# Patient Record
Sex: Male | Born: 1970 | Race: White | Hispanic: No | Smoking: Current every day smoker
Health system: Southern US, Community
[De-identification: ages and names within clinical notes are randomized; demographics above are authoritative.]

## PROBLEM LIST (undated history)

## (undated) ENCOUNTER — Emergency Department (HOSPITAL_COMMUNITY): Admission: EM | Payer: Self-pay | Source: Home / Self Care

## (undated) DIAGNOSIS — R0600 Dyspnea, unspecified: Secondary | ICD-10-CM

## (undated) DIAGNOSIS — I10 Essential (primary) hypertension: Secondary | ICD-10-CM

## (undated) DIAGNOSIS — I739 Peripheral vascular disease, unspecified: Secondary | ICD-10-CM

## (undated) DIAGNOSIS — I639 Cerebral infarction, unspecified: Secondary | ICD-10-CM

## (undated) DIAGNOSIS — K219 Gastro-esophageal reflux disease without esophagitis: Secondary | ICD-10-CM

## (undated) DIAGNOSIS — F419 Anxiety disorder, unspecified: Secondary | ICD-10-CM

## (undated) HISTORY — PX: FEMORAL ARTERY STENT: SHX1583

---

## 2001-08-30 ENCOUNTER — Emergency Department (HOSPITAL_COMMUNITY): Admission: EM | Admit: 2001-08-30 | Discharge: 2001-08-30 | Payer: Self-pay | Admitting: *Deleted

## 2004-04-09 ENCOUNTER — Emergency Department (HOSPITAL_COMMUNITY): Admission: EM | Admit: 2004-04-09 | Discharge: 2004-04-09 | Payer: Self-pay | Admitting: Emergency Medicine

## 2012-02-24 ENCOUNTER — Encounter (HOSPITAL_COMMUNITY): Payer: Self-pay

## 2012-02-24 ENCOUNTER — Emergency Department (HOSPITAL_COMMUNITY)
Admission: EM | Admit: 2012-02-24 | Discharge: 2012-02-24 | Disposition: A | Discharge status: Home / Self Care | Payer: Self-pay | Attending: Emergency Medicine | Admitting: Emergency Medicine

## 2012-02-24 DIAGNOSIS — Y93E9 Activity, other interior property and clothing maintenance: Secondary | ICD-10-CM | POA: Insufficient documentation

## 2012-02-24 DIAGNOSIS — W260XXA Contact with knife, initial encounter: Secondary | ICD-10-CM | POA: Insufficient documentation

## 2012-02-24 DIAGNOSIS — S61209A Unspecified open wound of unspecified finger without damage to nail, initial encounter: Secondary | ICD-10-CM | POA: Insufficient documentation

## 2012-02-24 DIAGNOSIS — Y92009 Unspecified place in unspecified non-institutional (private) residence as the place of occurrence of the external cause: Secondary | ICD-10-CM | POA: Insufficient documentation

## 2012-02-24 DIAGNOSIS — W261XXA Contact with sword or dagger, initial encounter: Secondary | ICD-10-CM | POA: Insufficient documentation

## 2012-02-24 DIAGNOSIS — Z23 Encounter for immunization: Secondary | ICD-10-CM | POA: Insufficient documentation

## 2012-02-24 DIAGNOSIS — F17200 Nicotine dependence, unspecified, uncomplicated: Secondary | ICD-10-CM | POA: Insufficient documentation

## 2012-02-24 MED ORDER — TETANUS-DIPHTH-ACELL PERTUSSIS 5-2.5-18.5 LF-MCG/0.5 IM SUSP
INTRAMUSCULAR | Status: AC
  Administered 2012-02-24: 0.5 mL via INTRAMUSCULAR
  Filled 2012-02-24: qty 0.5

## 2012-02-24 MED ORDER — HYDROCODONE-ACETAMINOPHEN 5-325 MG PO TABS: ORAL_TABLET | ORAL | Status: DC

## 2012-02-24 MED ORDER — BUPIVACAINE HCL (PF) 0.25 % IJ SOLN
INTRAMUSCULAR | Status: AC
  Filled 2012-02-24: qty 30

## 2012-02-24 NOTE — ED Notes (Signed)
Pt was laying carpet, cut left hand middle finger with razor knife. Bleeding controlled w/ dressing.

## 2012-02-24 NOTE — ED Provider Notes (Signed)
History     CSN: [id number]  Arrival date & time 02/24/12  1756   First MD Initiated Contact with Patient 02/24/12 1823      Chief Complaint  Patient presents with  . Extremity Laceration    (Consider location/radiation/quality/duration/timing/severity/associated sxs/prior treatment) HPI Comments: Patient states he was assisted with remodeling improving today. He was cutting carpet with a new razor knife and cut the tip of the left middle finger. He attempted to get the bleeding to stop on his own but was unsuccessful, and presents to the emergency department for assistance with this problem. Patient states he is unsure of the date of his last tetanus. The patient is not on any blood thinning type medications. He does not have a history of any bleeding disorders. There were no other lacerations or injuries reported at this time.  The history is provided by the patient.    History reviewed. No pertinent past medical history.  History reviewed. No pertinent past surgical history.  No family history on file.  History  Substance Use Topics  . Smoking status: Current Every Day Smoker    Types: Cigarettes  . Smokeless tobacco: Not on file  . Alcohol Use: No      Review of Systems  Constitutional: Negative for activity change.       All ROS Neg except as noted in HPI  HENT: Negative for nosebleeds and neck pain.   Eyes: Negative for photophobia and discharge.  Respiratory: Negative for cough, shortness of breath and wheezing.   Cardiovascular: Negative for chest pain and palpitations.  Gastrointestinal: Negative for abdominal pain and blood in stool.  Genitourinary: Negative for dysuria, frequency and hematuria.  Musculoskeletal: Negative for back pain and arthralgias.  Skin: Negative.   Neurological: Negative for dizziness, seizures and speech difficulty.  Psychiatric/Behavioral: Negative for hallucinations and confusion.    Allergies  Review of patient's allergies  indicates not on file.  Home Medications   Current Outpatient Rx  Name  Route  Sig  Dispense  Refill  . HYDROcodone-acetaminophen (NORCO/VICODIN) 5-325 MG per tablet      1 or 2 po q4h prn pain   20 tablet   0     BP 140/79  Pulse 86  Temp(Src) 98.2 F (36.8 C) (Oral)  Resp 20  Ht 5\' 10"  (1.778 m)  Wt 160 lb (72.576 kg)  BMI 22.96 kg/m2  SpO2 100%  Physical Exam  Nursing note and vitals reviewed. Constitutional: He is oriented to person, place, and time. He appears well-developed and well-nourished.  Non-toxic appearance.  HENT:  Head: Normocephalic.  Right Ear: Tympanic membrane and external ear normal.  Left Ear: Tympanic membrane and external ear normal.  Eyes: EOM and lids are normal. Pupils are equal, round, and reactive to light.  Neck: Normal range of motion. Neck supple. Carotid bruit is not present.  Cardiovascular: Normal rate, regular rhythm, normal heart sounds, intact distal pulses and normal pulses.   Pulmonary/Chest: Breath sounds normal. No respiratory distress.  Abdominal: Soft. Bowel sounds are normal. There is no tenderness. There is no guarding.  Musculoskeletal: Normal range of motion.  Patient has a dime-sized avulsion of the plantar surface of the tip of the left middle finger. There is active bleeding present. There is no bone or tendon involvement. The patient has full range of motion of all the fingers of the left hand.  Lymphadenopathy:       Head (right side): No submandibular adenopathy present.  Head (left side): No submandibular adenopathy present.    He has no cervical adenopathy.  Neurological: He is alert and oriented to person, place, and time. He has normal strength. No cranial nerve deficit or sensory deficit.  Skin: Skin is warm and dry.  Psychiatric: He has a normal mood and affect. His speech is normal.    ED Course  Procedures REPAIR OF AVULSION OF TIP OF THE LEFT MIDDLE FINGER. Patient identified by arm band. Permission  for the procedure given by the patient. Procedural time out taken before repair of avulsion of the tip of the left middle finger.  The patient was cutting carpet when he cut the tip of his left middle finger. There is no flap. There is bleeding from the site. The procedure was explained to the patient in terms which he understood, and questions answered.  The left hand was cleansed with sure-cleanse. A digital block to the left middle finger was carried out with 0.25% Marcaine. A sterile rubber band was used as a tourniquet. Bleeding was controlled and the wound was better inspected. The wound was irrigated with saline and cleansed with sure-cleanse. The middle finger was then painted with Betadine and using sterile technique direct pressure was applied to the avulsed area. 3 areas of bleeding were cauterized. The area was then observed and no bleeding occurred. The tourniquet was removed and still no bleeding present. A sterile dressing using quick clot was applied by me. Patient tolerated the procedure without problem. Patient's tetanus was updated.  Labs Reviewed - No data to display No results found.   1. Avulsion, finger tip       MDM  I have reviewed nursing notes, vital signs, and all appropriate lab and imaging results for this patient.  Patient sustained an avulsion laceration of the tip of the left middle finger. The bleeding area was cauterized, and a sterile pressure dressing applied. Patient status was updated. The patient is given a prescription for Norco one to 2 tablets every 4 hours as needed for pain #20 tablets. The patient has been given instructions on cleansing the wound as well as instructions on return if any signs of infection. Patient verbalizes understanding of the instructions.       Kathie Dike, [place] 02/24/12 [phone]

## 2012-02-24 NOTE — Discharge Instructions (Signed)
You have an avulsion wound at the tip of the left middle finger. Please keep your left hand elevated above your heart is much as possible today for her. Please cleanse your hand with soap and water starting Thursday, January 20, and change dressing daily. On February 24 you may use a Band-Aid to the area only. Please Loraine Leriche your records that you received a tetanus shot today. Please use ibuprofen every 6 hours for mild pain, please use Norco for more severe pain. Norco may cause drowsiness or lightheadedness, please use with caution. Please return to the emergency department if any changes, problems, or concerns. Deep Skin Avulsion A deep skin avulsion is when all layers of the skin or parts of body structures have been torn away. This is usually a result of severe injury (trauma). A deep skin avulsion can include damage to important structures beneath the skin such as tendons, ligaments, nerves, or blood vessels.  CAUSES  Many injuries can lead to a deep skin avulsion. These include:   Crush injuries.  Bites.  Falls against jagged surfaces.  Gunshot wounds.  Severe burns and injuries involving dragging (such as those from a bicycle or motorcycle accident). TREATMENT   If the wound is small and there is no damage to vital structures like nerves and blood vessels, the damaged tissues may be removed. Then, the wound can be cleaned thoroughly and closed.  A skin graft may be performed. This is a procedure in which the outer layer of skin is removed from a different part of your body. That skin (skin graft) is used to cover the open wound. This can happen after damaged tissue is removed and repairs are completed.  Your caregiver may onlyapply a bandage (dressing) to the wound. The wound will be kept clean and allowed to heal. Healing can take weeks or months and usually leaves a large scar. This type of treatment is only done if your caregiver feels that skin grafting or a similar procedure would not  work. You might need a tetanus shot if:  You cannot remember when you had your last tetanus shot.  You have never had a tetanus shot.  The injury broke your skin. If you got a tetanus shot, your arm may swell, get red, and feel warm to the touch. This is common and not a problem. If you need a tetanus shot and you choose not to have one, there is a rare chance of getting tetanus. Sickness from tetanus can be serious. HOME CARE INSTRUCTIONS   Only take over-the-counter or prescription medicines for pain, discomfort, or fever as directed by your caregiver.  Gently wash the area with mild soap and water 2 times a day, or as directed. Rinse off the soap. Pat the area dry with a clean towel. Do not rub the wound. This may cause bleeding.  Follow your caregiver's instructions for how often you need to change the dressing.  Apply ointment and a dressing to the wound as directed.  If the dressing sticks, moisten it with soapy water and gently remove it.  Change the bandage right away if it becomes wet, dirty, or starts to smell bad.  Take showers. Do not take tub baths, swim, or do anything that may soak the wound until it is healed.  Use anti-itch medicine as directed by your caregiver. The wound may itch when it is healing. Do not pick or scratch at the wound.  Follow up with your caregiver for stitches (sutures), staple, or skin  adhesive strip removal. SEEK MEDICAL CARE IF:   You have redness, swelling, or increasing pain in your wound.  A red streak or line extends away from the wound.  You have pus coming from the wound.  You notice a bad smell coming from thewound or dressing.  The wound breaks open (edges not staying together) after sutures have been removed.  You notice something coming out of the wound, such as a small piece of wood, glass, or metal.  You are unable to properly move a finger or toe if the wound is on your hand or foot.  You have severe swelling around  the wound that causes pain and numbness.  Your arm, hand, leg, or foot changes color. SEEK IMMEDIATE MEDICAL CARE IF:   Your pain becomes severe or is not adequately relieved with pain medicine.  You have a fever.  You have nausea and vomiting for more than 24 hours.  You feel lightheaded, weak, or faint.  You develop chest pain or difficulty breathing. MAKE SURE YOU:   Understand these instructions.  Will watch your condition.  Will get help right away if you are not doing well or get worse. Document Released: 02/18/2006 Document Revised: 03/17/2011 Document Reviewed: 04/28/2010 [facility] Patient Information 2013 [place], [place].

## 2012-02-24 NOTE — ED Notes (Signed)
Loney Laurence PA in to see pt.  Treated and bandaged by him.

## 2012-02-29 NOTE — ED Provider Notes (Signed)
History/physical exam/procedure(s) were performed by non-physician practitioner and as supervising physician I was immediately available for consultation/collaboration. I have reviewed all notes and am in agreement with care and plan.   Hilario Quarry, MD 02/29/12 [phone]

## 2013-02-17 ENCOUNTER — Ambulatory Visit (INDEPENDENT_AMBULATORY_CARE_PROVIDER_SITE_OTHER): Payer: Self-pay | Admitting: Otolaryngology

## 2014-03-16 ENCOUNTER — Encounter (HOSPITAL_COMMUNITY): Payer: Self-pay | Admitting: Emergency Medicine

## 2014-03-16 ENCOUNTER — Emergency Department (HOSPITAL_COMMUNITY)
Admission: EM | Admit: 2014-03-16 | Discharge: 2014-03-16 | Disposition: A | Discharge status: Home / Self Care | Payer: Self-pay | Attending: Emergency Medicine | Admitting: Emergency Medicine

## 2014-03-16 DIAGNOSIS — K047 Periapical abscess without sinus: Secondary | ICD-10-CM | POA: Insufficient documentation

## 2014-03-16 DIAGNOSIS — Z880 Allergy status to penicillin: Secondary | ICD-10-CM | POA: Insufficient documentation

## 2014-03-16 DIAGNOSIS — Z720 Tobacco use: Secondary | ICD-10-CM | POA: Insufficient documentation

## 2014-03-16 MED ORDER — HYDROCODONE-ACETAMINOPHEN 5-325 MG PO TABS: 2.0000 | ORAL_TABLET | Freq: Once | ORAL | Status: DC

## 2014-03-16 MED ORDER — KETOROLAC TROMETHAMINE 30 MG/ML IJ SOLN
30.0000 mg | Freq: Once | INTRAMUSCULAR | Status: AC
  Administered 2014-03-16: 30 mg via INTRAVENOUS
  Filled 2014-03-16: qty 1

## 2014-03-16 MED ORDER — CLINDAMYCIN PHOSPHATE 600 MG/50ML IV SOLN
600.0000 mg | Freq: Once | INTRAVENOUS | Status: AC
  Administered 2014-03-16: 600 mg via INTRAVENOUS
  Filled 2014-03-16: qty 50

## 2014-03-16 NOTE — Discharge Instructions (Signed)
You were treated with IV clindamycin and IV Toradol tonight. Please see your dentist for follow-up as directed. Pleased usual medications as directed. Return to the emergency department or see your dentist if any fever that will not respond to Tylenol or ibuprofen, or difficulty with swallowing or breathing. Dental Abscess A dental abscess is a collection of infected fluid (pus) from a bacterial infection in the inner part of the tooth (pulp). It usually occurs at the end of the tooth's root.  CAUSES   Severe tooth decay.  Trauma to the tooth that allows bacteria to enter into the pulp, such as a broken or chipped tooth. SYMPTOMS   Severe pain in and around the infected tooth.  Swelling and redness around the abscessed tooth or in the mouth or face.  Tenderness.  Pus drainage.  Bad breath.  Bitter taste in the mouth.  Difficulty swallowing.  Difficulty opening the mouth.  Nausea.  Vomiting.  Chills.  Swollen neck glands. DIAGNOSIS   A medical and dental history will be taken.  An examination will be performed by tapping on the abscessed tooth.  X-rays may be taken of the tooth to identify the abscess. TREATMENT The goal of treatment is to eliminate the infection. You may be prescribed antibiotic medicine to stop the infection from spreading. A root canal may be performed to save the tooth. If the tooth cannot be saved, it may be pulled (extracted) and the abscess may be drained.  HOME CARE INSTRUCTIONS  Only take over-the-counter or prescription medicines for pain, fever, or discomfort as directed by your caregiver.  Rinse your mouth (gargle) often with salt water ( tsp salt in 8 oz [250 ml] of warm water) to relieve pain or swelling.  Do not drive after taking pain medicine (narcotics).  Do not apply heat to the outside of your face.  Return to your dentist for further treatment as directed. SEEK MEDICAL CARE IF:  Your pain is not helped by medicine.  Your  pain is getting worse instead of better. SEEK IMMEDIATE MEDICAL CARE IF:  You have a fever or persistent symptoms for more than 2-3 days.  You have a fever and your symptoms suddenly get worse.  You have chills or a very bad headache.  You have problems breathing or swallowing.  You have trouble opening your mouth.  You have swelling in the neck or around the eye. Document Released: 12/23/2004 Document Revised: 09/17/2011 Document Reviewed: 04/02/2010 [facility] Patient Information 2015 [place], [place]. This information is not intended to replace advice given to you by your health care provider. Make sure you discuss any questions you have with your health care provider.

## 2014-03-16 NOTE — ED Provider Notes (Signed)
CSN: [id number]     Arrival date & time 03/16/14  1353 History   First MD Initiated Contact with Patient 03/16/14 1630     Chief Complaint  Patient presents with  . Abscess     (Consider location/radiation/quality/duration/timing/severity/associated sxs/prior Treatment) HPI Comments: Patient is a 44 year old male who presents to the emergency department with an abscess of the left upper jaw. The patient was seen by the dentist today, and sent to the emergency department for IV antibiotics because of an abscess. The patient denies any recent fever. There's been no problem with breathing or swallowing. He does not have any medical conditions that would interfere with the immune system. He presents now at the request of his dentist for additional management.  Patient is a 44 y.o. male presenting with abscess. The history is provided by the patient.  Abscess Abscess location: LEFT UPPER JAW. Associated symptoms: no fever     History reviewed. No pertinent past medical history. History reviewed. No pertinent past surgical history. No family history on file. History  Substance Use Topics  . Smoking status: Current Every Day Smoker    Types: Cigarettes  . Smokeless tobacco: Not on file  . Alcohol Use: No    Review of Systems  Constitutional: Negative for fever, chills, activity change and appetite change.       All ROS Neg except as noted in HPI  HENT: Positive for dental problem.   Eyes: Negative for photophobia and discharge.  Respiratory: Negative for cough, shortness of breath and wheezing.   Cardiovascular: Negative for chest pain and palpitations.  Gastrointestinal: Negative for abdominal pain and blood in stool.  Genitourinary: Negative for dysuria, frequency and hematuria.  Musculoskeletal: Negative for back pain, arthralgias and neck pain.  Skin: Negative.   Neurological: Negative for dizziness, seizures and speech difficulty.  Psychiatric/Behavioral: Negative for  hallucinations and confusion.      Allergies  Penicillins  Home Medications   Prior to Admission medications   Not on File   BP 180/84 mmHg  Pulse 73  Temp(Src) 98.7 F (37.1 C) (Oral)  Resp 20  Ht 5\' 10"  (1.778 m)  Wt 162 lb (73.483 kg)  BMI 23.24 kg/m2  SpO2 100% Physical Exam  Constitutional: He is oriented to person, place, and time. He appears well-developed and well-nourished.  Non-toxic appearance.  HENT:  Head: Normocephalic.  Right Ear: Tympanic membrane and external ear normal.  Left Ear: Tympanic membrane and external ear normal.  Abscess noted at the left upper bicuspid (tooth #12). There is swelling of the gum.  There is mild soreness of the left face. No hot areas appreciated.  Eyes: EOM and lids are normal. Pupils are equal, round, and reactive to light.  Neck: Normal range of motion. Neck supple. Carotid bruit is not present.  Cardiovascular: Normal rate, regular rhythm, normal heart sounds, intact distal pulses and normal pulses.   Pulmonary/Chest: Breath sounds normal. No respiratory distress.  Abdominal: Soft. Bowel sounds are normal. There is no tenderness. There is no guarding.  Musculoskeletal: Normal range of motion.  Lymphadenopathy:       Head (right side): No submandibular adenopathy present.       Head (left side): No submandibular adenopathy present.    He has no cervical adenopathy.  Neurological: He is alert and oriented to person, place, and time. He has normal strength. No cranial nerve deficit or sensory deficit.  Skin: Skin is warm and dry.  Psychiatric: He has a normal mood and affect.  His speech is normal.  Nursing note and vitals reviewed.   ED Course  Procedures (including critical care time) Labs Review Labs Reviewed - No data to display  Imaging Review No results found.   EKG Interpretation None      MDM  The blood pressure is elevated at 180/84. The vital signs are otherwise within normal limits. The pulse oximetry  is 100% on room air. Within normal limits by my interpretation. No evidence for Ludwig's angina.  I spoke with Dr. Marland KitchenD" [facility]. She request that the patient received IV antibiotics. She has given the patient prescription for oral antibiotics for tomorrow.  Patient was given 600 mg of clindamycin IV, as well as 30 mg of Toradol IV. The patient tolerated this without any problem or reaction whatsoever.  The patient will be discharged home he has ibuprofen prescription as well as clindamycin prescription. The patient is invited to return if any fevers that will not respond to Tylenol or ibuprofen, or any significant change in his general condition.    Final diagnoses:  None    **I have reviewed nursing notes, vital signs, and all appropriate lab and imaging results for this patient.Lily Kocher, PA-C 03/16/14 [place], PA-C 03/16/14 1755  Virgel Manifold, MD 03/20/14 [phone]

## 2014-03-16 NOTE — ED Notes (Signed)
Pt reports went to dentist for dental abscess, facial swelling noted.  Reports went to dentist today and sent here for possible IV antibiotics.

## 2016-09-02 ENCOUNTER — Ambulatory Visit: Payer: Self-pay | Admitting: Family Medicine

## 2016-09-17 ENCOUNTER — Ambulatory Visit: Payer: Self-pay | Admitting: Family Medicine

## 2016-10-28 ENCOUNTER — Ambulatory Visit: Payer: Self-pay | Admitting: Family Medicine

## 2018-02-03 DIAGNOSIS — Z139 Encounter for screening, unspecified: Secondary | ICD-10-CM

## 2018-02-03 LAB — GLUCOSE, POCT (MANUAL RESULT ENTRY): POC Glucose: 124 mg/dl — AB (ref 70–99)

## 2018-02-03 NOTE — Congregational Nurse Program (Signed)
  Dept: [phone]   Congregational Nurse Program Note  Date of Encounter: 02/03/2018  Past Medical History: No past medical history on file.  Encounter Details: CNP Questionnaire - 02/03/18 1441      Questionnaire   Patient Status  Not Applicable    Race  White or Caucasian    Location Patient Served At  [facility]  Not Applicable    Uninsured  Uninsured (NEW 1x/quarter)    Food  No food insecurities    Housing/Utilities  Yes, have permanent housing    Transportation  No transportation needs    Interpersonal Safety  Yes, feel physically and emotionally safe where you currently live    Medication  No medication insecurities    Medical Provider  No    Referrals  [facility];Dental    ED Visit Averted  Not Applicable    Life-Saving Intervention Made  Not Applicable      Pt Presents to clinic with need of medical referral for PCP due concern c/o elevated BP (self-monitored) and of dental pain and care that he has been dealing with in the last 4 years.  States he last saw a PCP about 25 years ago and last saw a dentist 2-3 months ago.   States the dentist prescribed him with Clindamycin for the infection he had within his tooth.     Vital signs checked and Random Blood Glucose conducted  Pt interviewed and enrolled  for [facility] eligibility with C. Broadnax  Vitals signs reviewed with RN Case Manager, P.Gilley,, in addition to pt also referred for post visit case management to be followed up.  Appt was made with [facility] for Feb 08, 2018 at 9:45 AM

## 2018-02-08 ENCOUNTER — Encounter: Payer: Self-pay | Admitting: Physician Assistant

## 2018-02-08 ENCOUNTER — Ambulatory Visit: Payer: Self-pay | Admitting: Physician Assistant

## 2018-02-08 ENCOUNTER — Other Ambulatory Visit: Payer: Self-pay | Admitting: Physician Assistant

## 2018-02-08 ENCOUNTER — Other Ambulatory Visit (HOSPITAL_COMMUNITY)
Admission: RE | Admit: 2018-02-08 | Discharge: 2018-02-08 | Disposition: A | Discharge status: Home / Self Care | Payer: Self-pay | Source: Ambulatory Visit | Attending: Physician Assistant | Admitting: Physician Assistant

## 2018-02-08 VITALS — BP 125/70 | HR 65 | Temp 97.7°F | Ht 68.5 in | Wt 175.0 lb

## 2018-02-08 DIAGNOSIS — F419 Anxiety disorder, unspecified: Secondary | ICD-10-CM

## 2018-02-08 DIAGNOSIS — Z1211 Encounter for screening for malignant neoplasm of colon: Secondary | ICD-10-CM

## 2018-02-08 DIAGNOSIS — Z13220 Encounter for screening for lipoid disorders: Secondary | ICD-10-CM

## 2018-02-08 DIAGNOSIS — Z125 Encounter for screening for malignant neoplasm of prostate: Secondary | ICD-10-CM

## 2018-02-08 DIAGNOSIS — Z7689 Persons encountering health services in other specified circumstances: Secondary | ICD-10-CM

## 2018-02-08 DIAGNOSIS — R5383 Other fatigue: Secondary | ICD-10-CM

## 2018-02-08 DIAGNOSIS — K0889 Other specified disorders of teeth and supporting structures: Secondary | ICD-10-CM

## 2018-02-08 LAB — LIPID PANEL
Cholesterol: 238 mg/dL — ABNORMAL HIGH (ref 0–200)
HDL: 36 mg/dL — ABNORMAL LOW (ref >40)
LDL Cholesterol: 151 mg/dL — ABNORMAL HIGH (ref 0–99)
Total CHOL/HDL Ratio: 6.6 RATIO
Triglycerides: 253 mg/dL — ABNORMAL HIGH (ref <150)
VLDL: 51 mg/dL — ABNORMAL HIGH (ref 0–40)

## 2018-02-08 LAB — CBC
HCT: 51.1 % (ref 39.0–52.0)
Hemoglobin: 16.5 g/dL (ref 13.0–17.0)
MCH: 29.8 pg (ref 26.0–34.0)
MCHC: 32.3 g/dL (ref 30.0–36.0)
MCV: 92.2 fL (ref 80.0–100.0)
Platelets: 278 10*3/uL (ref 150–400)
RBC: 5.54 MIL/uL (ref 4.22–5.81)
RDW: 12.1 % (ref 11.5–15.5)
WBC: 9.5 10*3/uL (ref 4.0–10.5)
nRBC: 0 % (ref 0.0–0.2)

## 2018-02-08 LAB — COMPREHENSIVE METABOLIC PANEL
ALT: 16 U/L (ref 0–44)
AST: 16 U/L (ref 15–41)
Albumin: 4.6 g/dL (ref 3.5–5.0)
Alkaline Phosphatase: 59 U/L (ref 38–126)
Anion gap: 8 (ref 5–15)
BUN: 13 mg/dL (ref 6–20)
CO2: 28 mmol/L (ref 22–32)
Calcium: 8.9 mg/dL (ref 8.9–10.3)
Chloride: 104 mmol/L (ref 98–111)
Creatinine, Ser: 0.9 mg/dL (ref 0.61–1.24)
GFR calc Af Amer: >60 mL/min (ref >60)
GFR calc non Af Amer: >60 mL/min (ref >60)
Glucose, Bld: 92 mg/dL (ref 70–99)
Potassium: 4.4 mmol/L (ref 3.5–5.1)
Sodium: 140 mmol/L (ref 135–145)
Total Bilirubin: 0.6 mg/dL (ref 0.3–1.2)
Total Protein: 7.5 g/dL (ref 6.5–8.1)

## 2018-02-08 LAB — TSH: TSH: 1.538 u[IU]/mL (ref 0.350–4.500)

## 2018-02-08 LAB — PSA: Prostatic Specific Antigen: 0.75 ng/mL (ref 0.00–4.00)

## 2018-02-08 MED ORDER — CLINDAMYCIN HCL 300 MG PO CAPS: 300.0000 mg | ORAL_CAPSULE | Freq: Three times a day (TID) | ORAL | 0 refills | Status: DC

## 2018-02-08 NOTE — Progress Notes (Signed)
BP 125/70 (BP Location: Left Arm, Patient Position: Sitting, Cuff Size: Normal)   Pulse 65   Temp 97.7 F (36.5 C)   Ht 5' 8.5" (1.74 m)   Wt 175 lb (79.4 kg)   SpO2 98%   BMI 26.22 kg/m    Subjective:    Patient ID: Caleb Powell, male    DOB: 31-Oct-1970, 48 y.o.   MRN: [id number]  HPI: Caleb Powell is a 48 y.o. male presenting on 02/08/2018 for New Patient (Initial Visit) (no PCP in a very long time. pt states he has seen Dr. Merlene Laughter to get clindamycin for teeth. pt states he last saw him 11/2017) and Dental Problem (numbness on upper L which radiates upward. )   HPI Chief Complaint  Patient presents with  . New Patient (Initial Visit)    no PCP in a very long time. pt states he has seen Dr. Merlene Laughter to get clindamycin for teeth. pt states he last saw him 11/2017  . Dental Problem    numbness on upper L which radiates upward.      Pt saw [facility] Dr Merlene Laughter.  Pt does "odds and ends jobs" currently.   In the past pt drove a forklift.   Pt has some anxiety-- uses his friends ativan    Relevant past medical, surgical, family and social history reviewed and updated as indicated. Interim medical history since our last visit reviewed. Allergies and medications reviewed and updated.   Current Outpatient Medications:  .  clindamycin (CLEOCIN) 300 MG capsule, Take 300 mg by mouth 3 (three) times daily., Disp: , Rfl:  .  LORazepam (ATIVAN PO), Take by mouth., Disp: , Rfl:  .  Pseudoeph-Doxylamine-DM-APAP (NYQUIL PO), Take by mouth., Disp: , Rfl:     Review of Systems  Constitutional: Positive for fatigue. Negative for appetite change, chills, diaphoresis, fever and unexpected weight change.  HENT: Positive for congestion, dental problem, facial swelling and sore throat. Negative for drooling, ear pain, hearing loss, mouth sores, sneezing, trouble swallowing and voice change.   Eyes: Negative for pain, discharge, redness, itching and visual disturbance.  Respiratory:  Positive for cough, shortness of breath and wheezing. Negative for choking.   Cardiovascular: Negative for chest pain, palpitations and leg swelling.  Gastrointestinal: Negative for abdominal pain, blood in stool, constipation, diarrhea and vomiting.  Endocrine: Negative for cold intolerance, heat intolerance and polydipsia.  Genitourinary: Negative for decreased urine volume, dysuria and hematuria.  Musculoskeletal: Negative for arthralgias, back pain and gait problem.  Skin: Negative for rash.  Allergic/Immunologic: Negative for environmental allergies.  Neurological: Positive for headaches. Negative for seizures, syncope and light-headedness.  Hematological: Negative for adenopathy.  Psychiatric/Behavioral: Negative for agitation, dysphoric mood and suicidal ideas. The patient is nervous/anxious.     Per HPI unless specifically indicated above     Objective:    BP 125/70 (BP Location: Left Arm, Patient Position: Sitting, Cuff Size: Normal)   Pulse 65   Temp 97.7 F (36.5 C)   Ht 5' 8.5" (1.74 m)   Wt 175 lb (79.4 kg)   SpO2 98%   BMI 26.22 kg/m   Wt Readings from Last 3 Encounters:  02/08/18 175 lb (79.4 kg)  02/03/18 174 lb 3.2 oz (79 kg)  03/16/14 162 lb (73.5 kg)    Physical Exam Vitals signs reviewed.  Constitutional:      Appearance: He is well-developed.  HENT:     Head: Normocephalic and atraumatic.     Mouth/Throat:  Dentition: Abnormal dentition. Dental caries present. No dental abscesses.     Pharynx: No oropharyngeal exudate.     Comments: No swelling of face Eyes:     Conjunctiva/sclera: Conjunctivae normal.     Pupils: Pupils are equal, round, and reactive to light.  Neck:     Musculoskeletal: Neck supple.     Thyroid: No thyromegaly.  Cardiovascular:     Rate and Rhythm: Normal rate and regular rhythm.  Pulmonary:     Effort: Pulmonary effort is normal.     Breath sounds: Normal breath sounds. No wheezing or rales.  Abdominal:     General:  Bowel sounds are normal.     Palpations: Abdomen is soft. There is no mass.     Tenderness: There is no abdominal tenderness.  Lymphadenopathy:     Cervical: No cervical adenopathy.  Skin:    General: Skin is warm and dry.     Findings: No rash.  Neurological:     Mental Status: He is alert and oriented to person, place, and time.  Psychiatric:        Behavior: Behavior normal.        Thought Content: Thought content normal.          Assessment & Plan:    Encounter Diagnoses  Name Primary?  . Encounter to establish care Yes  . Dentalgia   . Other fatigue   . Anxiety   . Screening cholesterol level   . Screening for colon cancer   . Screening for prostate cancer     -will get baseline Labs -encouraged pt to go to [facility] for evaluation and treatment of anxiety -counseled regular exercise to help anxiety and fatigue -pt given rx clindamycin and will be put on dential list.  Pt requested refills on his antibiotic but discussed that wasn't appropriate -pt was given ifobt for colon cancer screening -counseled Smoking cessation -pt to follow up 1 month

## 2018-02-09 ENCOUNTER — Telehealth: Payer: Self-pay

## 2018-02-09 NOTE — Telephone Encounter (Signed)
Called to follow up with client after his first visit at [facility] [place]. No answer. Left voicemail requesting return call.  Debria Garret RN

## 2018-02-09 NOTE — Telephone Encounter (Signed)
Client returned call. He had questions regarding his dental needs. 1). Why was antibiotic not free: explained to client how [facility] works and that its use is primarily for long term medications and only those that are on the Med Assist list of approved medications.  Client states understanding.  2). How does the dental referral work. Will I have to wait over 30 days. Explained that Dental is a donated care and that there is a list of people waiting to be seen. The provider office will send a dental referral to our referral coordinator with [facility] and that referral is sent to a dentist as soon as there are openings. Client states understanding.  3). Why would the provider not give me refills on my antibiotic. "she gave me only enough to last 7 days" Explained to client the importance to take a course of antibiotics as directed until finished. Taking only 1 pill a day to stretch out the antibiotics. A resistance to antibiotics can occur taking them for long periods of time and also you will never fully get rid of the infection. RN recommended client taking his medication as directed and if after the 7 days he is still having problems he would then contact his primary care provider and discuss being seen if needed prior to his follow up. RN also discussed good oral hygiene. Client states understanding.  Client did report that he did get his labs drawn as directed. Encouraged client to call Case Manager for any questions or concerns. Contact information given.  Debria Garret RN

## 2018-02-24 ENCOUNTER — Ambulatory Visit: Payer: Self-pay | Admitting: Physician Assistant

## 2018-02-24 ENCOUNTER — Encounter: Payer: Self-pay | Admitting: Physician Assistant

## 2018-02-24 VITALS — BP 130/70 | HR 83 | Temp 98.1°F | Ht 68.5 in | Wt 175.2 lb

## 2018-02-24 DIAGNOSIS — F17200 Nicotine dependence, unspecified, uncomplicated: Secondary | ICD-10-CM

## 2018-02-24 DIAGNOSIS — F39 Unspecified mood [affective] disorder: Secondary | ICD-10-CM | POA: Insufficient documentation

## 2018-02-24 DIAGNOSIS — E785 Hyperlipidemia, unspecified: Secondary | ICD-10-CM

## 2018-02-24 DIAGNOSIS — Z720 Tobacco use: Secondary | ICD-10-CM | POA: Insufficient documentation

## 2018-02-24 DIAGNOSIS — K0889 Other specified disorders of teeth and supporting structures: Secondary | ICD-10-CM

## 2018-02-24 MED ORDER — CLINDAMYCIN HCL 300 MG PO CAPS: 300.0000 mg | ORAL_CAPSULE | Freq: Three times a day (TID) | ORAL | 0 refills | Status: DC

## 2018-02-24 MED ORDER — CHOLESTYRAMINE 4 GM/DOSE PO POWD: ORAL | 1 refills | Status: DC

## 2018-02-24 MED ORDER — ATORVASTATIN CALCIUM 20 MG PO TABS: 20.0000 mg | ORAL_TABLET | Freq: Every day | ORAL | 1 refills | Status: DC

## 2018-02-24 NOTE — Patient Instructions (Signed)

## 2018-02-24 NOTE — Progress Notes (Signed)
BP 130/70 (BP Location: Left Arm, Patient Position: Sitting, Cuff Size: Normal)   Pulse 83   Temp 98.1 F (36.7 C)   Ht 5' 8.5" (1.74 m)   Wt 175 lb 4 oz (79.5 kg)   SpO2 96%   BMI 26.26 kg/m    Subjective:    Patient ID: Caleb Powell, male    DOB: Jan 09, 1970, 48 y.o.   MRN: [id number]  HPI: Caleb Powell is a 48 y.o. male presenting on 02/24/2018 for Follow-up and Dental Problem (pt states swelling on L upper teeth. pt has been taking IBU which has helped with the swelling.  pt states no drainage. )   HPI   Chief Complaint  Patient presents with  . Follow-up  . Dental Problem    pt states swelling on L upper teeth. pt has been taking IBU which has helped with the swelling.  pt states no drainage.    Pt continues to smoke 2.5 ppd.   Relevant past medical, surgical, family and social history reviewed and updated as indicated. Interim medical history since our last visit reviewed. Allergies and medications reviewed and updated.   Current Outpatient Medications:  .  LORazepam (ATIVAN PO), Take by mouth 3 times/day as needed-between meals & bedtime. , Disp: , Rfl:  .  Pseudoeph-Doxylamine-DM-APAP (NYQUIL PO), Take by mouth., Disp: , Rfl:     Review of Systems  Constitutional: Positive for fatigue. Negative for appetite change, chills, diaphoresis, fever and unexpected weight change.  HENT: Positive for dental problem and facial swelling. Negative for congestion, drooling, ear pain, hearing loss, mouth sores, sneezing, sore throat, trouble swallowing and voice change.   Eyes: Negative for pain, discharge, redness, itching and visual disturbance.  Respiratory: Positive for shortness of breath. Negative for cough, choking and wheezing.   Cardiovascular: Negative for chest pain, palpitations and leg swelling.  Gastrointestinal: Negative for abdominal pain, blood in stool, constipation, diarrhea and vomiting.  Endocrine: Negative for cold intolerance, heat intolerance and  polydipsia.  Genitourinary: Negative for decreased urine volume, dysuria and hematuria.  Musculoskeletal: Negative for arthralgias, back pain and gait problem.  Skin: Negative for rash.  Allergic/Immunologic: Negative for environmental allergies.  Neurological: Negative for seizures, syncope, light-headedness and headaches.  Hematological: Negative for adenopathy.  Psychiatric/Behavioral: Negative for agitation, dysphoric mood and suicidal ideas. The patient is not nervous/anxious.     Per HPI unless specifically indicated above     Objective:    BP 130/70 (BP Location: Left Arm, Patient Position: Sitting, Cuff Size: Normal)   Pulse 83   Temp 98.1 F (36.7 C)   Ht 5' 8.5" (1.74 m)   Wt 175 lb 4 oz (79.5 kg)   SpO2 96%   BMI 26.26 kg/m   Wt Readings from Last 3 Encounters:  02/24/18 175 lb 4 oz (79.5 kg)  02/08/18 175 lb (79.4 kg)  02/03/18 174 lb 3.2 oz (79 kg)    Physical Exam Vitals signs reviewed.  Constitutional:      Appearance: He is well-developed.  HENT:     Head: Normocephalic and atraumatic.     Mouth/Throat:     Mouth: Mucous membranes are moist.     Dentition: Abnormal dentition. Dental caries present. No dental abscesses.  Neck:     Musculoskeletal: Neck supple.  Cardiovascular:     Rate and Rhythm: Normal rate and regular rhythm.  Pulmonary:     Effort: Pulmonary effort is normal.     Breath sounds: Normal breath sounds. No  wheezing.  Abdominal:     General: Bowel sounds are normal.     Palpations: Abdomen is soft.     Tenderness: There is no abdominal tenderness.  Lymphadenopathy:     Cervical: No cervical adenopathy.  Skin:    General: Skin is warm and dry.  Neurological:     Mental Status: He is alert and oriented to person, place, and time.  Psychiatric:        Behavior: Behavior normal.     Results for orders placed or performed during the hospital encounter of 02/08/18  CBC  Result Value Ref Range   WBC 9.5 4.0 - 10.5 K/uL   RBC 5.54  4.22 - 5.81 MIL/uL   Hemoglobin 16.5 13.0 - 17.0 g/dL   HCT 51.1 39.0 - 52.0 %   MCV 92.2 80.0 - 100.0 fL   MCH 29.8 26.0 - 34.0 pg   MCHC 32.3 30.0 - 36.0 g/dL   RDW 12.1 11.5 - 15.5 %   Platelets 278 150 - 400 K/uL   nRBC 0.0 0.0 - 0.2 %  TSH  Result Value Ref Range   TSH 1.538 0.350 - 4.500 uIU/mL  PSA  Result Value Ref Range   Prostatic Specific Antigen 0.75 0.00 - 4.00 ng/mL  Comprehensive metabolic panel  Result Value Ref Range   Sodium 140 135 - 145 mmol/L   Potassium 4.4 3.5 - 5.1 mmol/L   Chloride 104 98 - 111 mmol/L   CO2 28 22 - 32 mmol/L   Glucose, Bld 92 70 - 99 mg/dL   BUN 13 6 - 20 mg/dL   Creatinine, Ser 0.90 0.61 - 1.24 mg/dL   Calcium 8.9 8.9 - 10.3 mg/dL   Total Protein 7.5 6.5 - 8.1 g/dL   Albumin 4.6 3.5 - 5.0 g/dL   AST 16 15 - 41 U/L   ALT 16 0 - 44 U/L   Alkaline Phosphatase 59 38 - 126 U/L   Total Bilirubin 0.6 0.3 - 1.2 mg/dL   GFR calc non Af Amer >60 >60 mL/min   GFR calc Af Amer >60 >60 mL/min   Anion gap 8 5 - 15  Lipid panel  Result Value Ref Range   Cholesterol 238 (H) 0 - 200 mg/dL   Triglycerides 253 (H) <150 mg/dL   HDL 36 (L) >40 mg/dL   Total CHOL/HDL Ratio 6.6 RATIO   VLDL 51 (H) 0 - 40 mg/dL   LDL Cholesterol 151 (H) 0 - 99 mg/dL      ssessment & Plan:   Encounter Diagnoses  Name Primary?  . Hyperlipidemia, unspecified hyperlipidemia type Yes  . Dentalgia   . Tobacco use disorder   . Mood disorder ([place])    Encounter Diagnoses  Name Primary?  . Hyperlipidemia, unspecified hyperlipidemia type Yes  . Dentalgia   . Tobacco use disorder   . Mood disorder ([place])     -reviewed labs with pt -counseled pt on lowfat diet for lipid reduction.  Statin is drug of choice but pt can't swallow pills so will rx questran -counseled smoking cessation.  Discussed role that smoking plays in dental health or damage -discussed with pt that he will not be kept on antibiotics for the 2 or 3 months that it will take him to see a dentist.  He  is put on dental list but the wait is several months long.  Pt was very angry when told that and that he could not be put at the top of the  list.   He is given rx clindamycin but encouraged pt to not take it at this time but to keep it for when he is getting abscess -pt to follow up 3 months to recheck lipids.  RTO sooner prn

## 2018-03-03 LAB — IFOBT (OCCULT BLOOD): IFOBT: NEGATIVE

## 2018-03-09 ENCOUNTER — Ambulatory Visit: Payer: Self-pay | Admitting: Physician Assistant

## 2018-06-24 ENCOUNTER — Encounter: Payer: Self-pay | Admitting: Physician Assistant

## 2018-06-24 ENCOUNTER — Ambulatory Visit: Payer: Self-pay | Admitting: Physician Assistant

## 2018-06-24 DIAGNOSIS — F17200 Nicotine dependence, unspecified, uncomplicated: Secondary | ICD-10-CM

## 2018-06-24 DIAGNOSIS — E785 Hyperlipidemia, unspecified: Secondary | ICD-10-CM

## 2018-06-24 NOTE — Progress Notes (Signed)
   There were no vitals taken for this visit.   Subjective:    Patient ID: Caleb Powell, male    DOB: Nov 09, 1970, 48 y.o.   MRN: [id number]  HPI: Caleb Powell is a 48 y.o. male presenting on 06/24/2018 for Hyperlipidemia   HPI    This is a telemedicine appointment due to coronavirus pandemic.  It is via telephone as Caleb Powell does not have a mobile phone with video capabilities.    I connected with  Caleb Powell on 06/24/18 by a video enabled telemedicine application and verified that I am speaking with the correct person using two identifiers.   I discussed the limitations of evaluation and management by telemedicine. The patient expressed understanding and agreed to proceed.   Caleb Powell is at home.  Provider is at office   Caleb Powell never started cholesterol medication.  He says he doesn't want to take medication.    He is still smoking  He says he is doing well.  He is wearing mask when he goes out  Relevant past medical, surgical, family and social history reviewed and updated as indicated. Interim medical history since our last visit reviewed. Allergies and medications reviewed and updated.   Current Outpatient Medications:  .  LORazepam (ATIVAN PO), Take by mouth 3 times/day as needed-between meals & bedtime. , Disp: , Rfl:  .  cholestyramine (QUESTRAN) 4 GM/DOSE powder, Once daily with meal (Patient not taking: Reported on 06/24/2018), Disp: 378 g, Rfl: 1 .  Pseudoeph-Doxylamine-DM-APAP (NYQUIL PO), Take by mouth., Disp: , Rfl:      Review of Systems  Per HPI unless specifically indicated above     Objective:    There were no vitals taken for this visit.  Wt Readings from Last 3 Encounters:  02/24/18 175 lb 4 oz (79.5 kg)  02/08/18 175 lb (79.4 kg)  02/03/18 174 lb 3.2 oz (79 kg)    Physical Exam Pulmonary:     Effort: No respiratory distress.  Neurological:     Mental Status: He is alert and oriented to person, place, and time.  Psychiatric:        Attention and Perception:  Attention normal.        Speech: Speech normal.        Behavior: Behavior is cooperative.           Assessment & Plan:   Encounter Diagnoses  Name Primary?  . Hyperlipidemia, unspecified hyperlipidemia type Yes  . Tobacco use disorder      -will Mail Caleb Powell a [employer] application (he says he got bills for blood work done in february) -reviewed with Caleb Powell reasons we treat hyperlipidemia but he still declines treatment -Caleb Powell says he is not ready to stop smoking -Caleb Powell says he hasn't heard on dental yet.  Discussed with Caleb Powell that dental was on hold for months due to CV19 -Caleb Powell will follow up in February.  He will Contact office sooner prn

## 2018-07-05 ENCOUNTER — Other Ambulatory Visit: Payer: Self-pay

## 2018-07-05 DIAGNOSIS — Z20822 Contact with and (suspected) exposure to covid-19: Secondary | ICD-10-CM

## 2018-07-08 LAB — NOVEL CORONAVIRUS, NAA: SARS-CoV-2, NAA: NOT DETECTED

## 2018-11-23 ENCOUNTER — Encounter: Payer: Self-pay | Admitting: Physician Assistant

## 2018-11-23 ENCOUNTER — Ambulatory Visit: Payer: Self-pay | Admitting: Physician Assistant

## 2018-11-23 DIAGNOSIS — K0889 Other specified disorders of teeth and supporting structures: Secondary | ICD-10-CM

## 2018-11-23 MED ORDER — CLINDAMYCIN HCL 300 MG PO CAPS: 300.0000 mg | ORAL_CAPSULE | Freq: Three times a day (TID) | ORAL | 0 refills | Status: AC

## 2018-11-23 NOTE — Progress Notes (Signed)
   There were no vitals taken for this visit.   Subjective:    Patient ID: Caleb Powell, male    DOB: [birth year], 48 y.o.   MRN: [id number]  HPI: Caleb Powell is a 48 y.o. male presenting on 11/23/2018 for No chief complaint on file.   HPI    This is a telemedicine appointment due to coronavirus pandemic.  It is via telephone as patient says all of his video enabled devices are currently being used by his children for online school.  I connected with  Caleb Powell on 11/24/18 by a video enabled telemedicine application and verified that I am speaking with the correct person using two identifiers.   I discussed the limitations of evaluation and management by telemedicine. The patient expressed understanding and agreed to proceed.  Patient is at home.  Provider is at office.   Patient complains of tooth ache and mild swelling of the face.  He says he has been using some leftover antibiotics from a previous dental problem.   Patient reports that he has Some congestion.  No fever.   He says he got covid tested was negative  Patient says his face is swelling "somewhat but it's been worse".  He says the tooth that is currently bothering him is on the left top back teeth.  He is able to eat okay.  No problems swallowing.    Relevant past medical, surgical, family and social history reviewed and updated as indicated. Interim medical history since our last visit reviewed. Allergies and medications reviewed and updated.   Current Outpatient Medications:  .  clindamycin (CLEOCIN) 300 MG capsule, Take 300 mg by mouth 3 (three) times daily., Disp: , Rfl:  .  LORazepam (ATIVAN PO), Take by mouth 3 times/day as needed-between meals & bedtime. , Disp: , Rfl:     Review of Systems  Per HPI unless specifically indicated above     Objective:    There were no vitals taken for this visit.  Wt Readings from Last 3 Encounters:  02/24/18 175 lb 4 oz (79.5 kg)  02/08/18 175 lb (79.4 kg)   02/03/18 174 lb 3.2 oz (79 kg)    Physical Exam Pulmonary:     Effort: No respiratory distress.  Neurological:     Mental Status: He is alert and oriented to person, place, and time.  Psychiatric:        Attention and Perception: Attention normal.        Speech: Speech normal.        Behavior: Behavior is cooperative.         Assessment & Plan:     1.  Dentalgia  Prescription for clindamycin is sent to pharmacy.  Patient is counseled on the need for him to completely finish his course of antibiotics and not have leftovers.  Patient is already on dental list.  Patient is to follow-up as scheduled.  He is to contact office sooner as needed.

## 2019-01-17 ENCOUNTER — Encounter: Payer: Self-pay | Admitting: Physician Assistant

## 2019-01-17 ENCOUNTER — Ambulatory Visit: Payer: Self-pay | Admitting: Physician Assistant

## 2019-01-17 DIAGNOSIS — E785 Hyperlipidemia, unspecified: Secondary | ICD-10-CM

## 2019-01-17 DIAGNOSIS — F17200 Nicotine dependence, unspecified, uncomplicated: Secondary | ICD-10-CM

## 2019-01-17 NOTE — Progress Notes (Signed)
   There were no vitals taken for this visit.   Subjective:    Patient ID: Caleb Powell, male    DOB: 1970/09/08, 49 y.o.   MRN: [id number]  HPI: Caleb Powell is a 49 y.o. male presenting on 01/17/2019 for No chief complaint on file.   HPI    This is a telemedicine visit through [employer] due to coronavirus pandemic.  I connected with  Noberto Ligman Walko on 01/17/19 by a video enabled telemedicine application and verified that I am speaking with the correct person using two identifiers.   I discussed the limitations of evaluation and management by telemedicine. The patient expressed understanding and agreed to proceed.  Pt is at home.  Provider is at office.    Pt is 43yoM who has appointment today for routine follow up.    He has dyslipidemia but has declined prescription treatment for this.   He smokes over 2 ppd..    Pt complains of fatigue.  He inquires about otc suppolement for low energy.  Pt Denies depression  Pt has not been exercising.   He discussed with family and they are planning to start exercising regularly.         Relevant past medical, surgical, family and social history reviewed and updated as indicated. Interim medical history since our last visit reviewed. Allergies and medications reviewed and updated.   Current Outpatient Medications:  .  LORazepam (ATIVAN PO), Take by mouth 3 times/day as needed-between meals & bedtime. , Disp: , Rfl:      Review of Systems  Per HPI unless specifically indicated above     Objective:    There were no vitals taken for this visit.  Wt Readings from Last 3 Encounters:  02/24/18 175 lb 4 oz (79.5 kg)  02/08/18 175 lb (79.4 kg)  02/03/18 174 lb 3.2 oz (79 kg)    Physical Exam Constitutional:      General: He is not in acute distress.    Appearance: Normal appearance. He is not ill-appearing.  HENT:     Head: Normocephalic and atraumatic.  Pulmonary:     Effort: Pulmonary effort is normal. No respiratory  distress.  Neurological:     Mental Status: He is alert and oriented to person, place, and time.  Psychiatric:        Attention and Perception: Attention normal.        Speech: Speech normal.        Behavior: Behavior is cooperative.         Assessment & Plan:    Encounter Diagnoses  Name Primary?  . Tobacco use disorder Yes  . Hyperlipidemia, unspecified hyperlipidemia type       -encouraged smoking cessation.  Discussed cutting back to no more than 1 ppd if he is not wanting to stop altogether at this point. -encouraged regular exercise -discussed with pt that healthy lifestyle would help his energy levels -pt to follow up 6 months.  He is to contact office sooner prn

## 2019-02-07 ENCOUNTER — Ambulatory Visit: Payer: Self-pay | Admitting: Physician Assistant

## 2019-05-17 ENCOUNTER — Ambulatory Visit: Payer: Self-pay | Admitting: Physician Assistant

## 2019-05-17 ENCOUNTER — Encounter: Payer: Self-pay | Admitting: Physician Assistant

## 2019-05-17 DIAGNOSIS — R5383 Other fatigue: Secondary | ICD-10-CM

## 2019-05-17 DIAGNOSIS — F39 Unspecified mood [affective] disorder: Secondary | ICD-10-CM

## 2019-05-17 DIAGNOSIS — Z125 Encounter for screening for malignant neoplasm of prostate: Secondary | ICD-10-CM

## 2019-05-17 DIAGNOSIS — F17200 Nicotine dependence, unspecified, uncomplicated: Secondary | ICD-10-CM

## 2019-05-17 DIAGNOSIS — E785 Hyperlipidemia, unspecified: Secondary | ICD-10-CM

## 2019-05-17 DIAGNOSIS — Z2821 Immunization not carried out because of patient refusal: Secondary | ICD-10-CM

## 2019-05-17 NOTE — Progress Notes (Signed)
   There were no vitals taken for this visit.   Subjective:    Patient ID: Caleb Powell, male    DOB: [birth year], 49 y.o.   MRN: [id number]  HPI: Caleb Powell is a 48 y.o. male presenting on 05/17/2019 for No chief complaint on file.   HPI   This is a telemedicine appointment through [employer] due to coronavirus pandemic.  I connected with  Caleb Powell on 05/17/19 by a video enabled telemedicine application and verified that I am speaking with the correct person using two identifiers.   I discussed the limitations of evaluation and management by telemedicine. The patient expressed understanding and agreed to proceed.  Pt is at home.  Provider is at office.     Pt c/o tired all the time.  He doesn't ever exercse.   He doesn't work regularly.    He was staying pretty busy with work before covid.    Denies depression.  He still taking a friends ativan every morning.  Cholesterol high in the past- he declined rx in past but says he will take something now if lipids still elevated.    Pt seems convinced that a  testosterone deficinecy is to blame for his lack of energy.   Pt continues to smoke.  He does not exercise.  He says he does play with his children.      Relevant past medical, surgical, family and social history reviewed and updated as indicated. Interim medical history since our last visit reviewed. Allergies and medications reviewed and updated.   Current Outpatient Medications:  .  LORazepam (ATIVAN PO), Take by mouth 3 times/day as needed-between meals & bedtime. , Disp: , Rfl:      Review of Systems  Per HPI unless specifically indicated above     Objective:    There were no vitals taken for this visit.  Wt Readings from Last 3 Encounters:  02/24/18 175 lb 4 oz (79.5 kg)  02/08/18 175 lb (79.4 kg)  02/03/18 174 lb 3.2 oz (79 kg)    Physical Exam Constitutional:      General: He is not in acute distress.    Appearance: He is not ill-appearing.  HENT:   Head: Normocephalic and atraumatic.  Pulmonary:     Effort: No respiratory distress.  Neurological:     Mental Status: He is alert and oriented to person, place, and time.  Psychiatric:        Attention and Perception: Attention normal.        Speech: Speech normal.           Assessment & Plan:    Encounter Diagnoses  Name Primary?  . Fatigue, unspecified type Yes  . Hyperlipidemia, unspecified hyperlipidemia type   . Mood disorder ([place])   . Tobacco use disorder   . Screening for prostate cancer   . COVID-19 virus vaccination declined      -Discussed that continuing benzo use may be contributing to fatigue but pt didn't think that was it.   -pt Declined covid vaccination -Will get labs and f/u in office 2 wk.  Encouraged regular exercise and smoking cessation

## 2019-05-24 ENCOUNTER — Other Ambulatory Visit: Payer: Self-pay

## 2019-05-24 ENCOUNTER — Other Ambulatory Visit (HOSPITAL_COMMUNITY)
Admission: RE | Admit: 2019-05-24 | Discharge: 2019-05-24 | Disposition: A | Discharge status: Home / Self Care | Payer: Self-pay | Source: Ambulatory Visit | Attending: Physician Assistant | Admitting: Physician Assistant

## 2019-05-24 DIAGNOSIS — Z125 Encounter for screening for malignant neoplasm of prostate: Secondary | ICD-10-CM | POA: Insufficient documentation

## 2019-05-24 DIAGNOSIS — R5383 Other fatigue: Secondary | ICD-10-CM | POA: Insufficient documentation

## 2019-05-24 DIAGNOSIS — E785 Hyperlipidemia, unspecified: Secondary | ICD-10-CM | POA: Insufficient documentation

## 2019-05-24 LAB — LIPID PANEL
Cholesterol: 217 mg/dL — ABNORMAL HIGH (ref 0–200)
HDL: 37 mg/dL — ABNORMAL LOW (ref >40)
LDL Cholesterol: 152 mg/dL — ABNORMAL HIGH (ref 0–99)
Total CHOL/HDL Ratio: 5.9 RATIO
Triglycerides: 141 mg/dL (ref <150)
VLDL: 28 mg/dL (ref 0–40)

## 2019-05-24 LAB — PSA: Prostatic Specific Antigen: 0.98 ng/mL (ref 0.00–4.00)

## 2019-05-24 LAB — COMPREHENSIVE METABOLIC PANEL
ALT: 19 U/L (ref 0–44)
AST: 16 U/L (ref 15–41)
Albumin: 4.3 g/dL (ref 3.5–5.0)
Alkaline Phosphatase: 69 U/L (ref 38–126)
Anion gap: 11 (ref 5–15)
BUN: 17 mg/dL (ref 6–20)
CO2: 28 mmol/L (ref 22–32)
Calcium: 9.1 mg/dL (ref 8.9–10.3)
Chloride: 103 mmol/L (ref 98–111)
Creatinine, Ser: 0.91 mg/dL (ref 0.61–1.24)
GFR calc Af Amer: >60 mL/min (ref >60)
GFR calc non Af Amer: >60 mL/min (ref >60)
Glucose, Bld: 104 mg/dL — ABNORMAL HIGH (ref 70–99)
Potassium: 4.2 mmol/L (ref 3.5–5.1)
Sodium: 142 mmol/L (ref 135–145)
Total Bilirubin: 0.5 mg/dL (ref 0.3–1.2)
Total Protein: 7.2 g/dL (ref 6.5–8.1)

## 2019-05-29 LAB — TESTOSTERONE,FREE AND TOTAL
Testosterone, Free: 10.9 pg/mL (ref 7.2–24.0)
Testosterone: 409 ng/dL (ref 264–916)

## 2019-06-01 ENCOUNTER — Ambulatory Visit: Payer: Self-pay | Admitting: Physician Assistant

## 2019-06-01 ENCOUNTER — Other Ambulatory Visit: Payer: Self-pay

## 2019-06-01 ENCOUNTER — Encounter: Payer: Self-pay | Admitting: Physician Assistant

## 2019-06-01 VITALS — BP 150/80 | HR 78 | Temp 99.1°F | Wt 178.0 lb

## 2019-06-01 DIAGNOSIS — E785 Hyperlipidemia, unspecified: Secondary | ICD-10-CM

## 2019-06-01 DIAGNOSIS — I10 Essential (primary) hypertension: Secondary | ICD-10-CM

## 2019-06-01 DIAGNOSIS — L578 Other skin changes due to chronic exposure to nonionizing radiation: Secondary | ICD-10-CM

## 2019-06-01 DIAGNOSIS — F39 Unspecified mood [affective] disorder: Secondary | ICD-10-CM

## 2019-06-01 DIAGNOSIS — F17200 Nicotine dependence, unspecified, uncomplicated: Secondary | ICD-10-CM

## 2019-06-01 MED ORDER — FLUVASTATIN SODIUM 20 MG PO CAPS: 20.0000 mg | ORAL_CAPSULE | Freq: Every day | ORAL | 4 refills | Status: DC

## 2019-06-01 MED ORDER — RAMIPRIL 5 MG PO CAPS: 5.0000 mg | ORAL_CAPSULE | Freq: Every day | ORAL | 1 refills | Status: DC

## 2019-06-01 NOTE — Progress Notes (Signed)
BP (!) 150/80   Pulse 78   Temp 99.1 F (37.3 C)   Wt 178 lb (80.7 kg)   SpO2 96%   BMI 26.67 kg/m    Subjective:    Patient ID: Caleb Powell, male    DOB: Nov 22, 1970, 49 y.o.   MRN: [id number]  HPI: Caleb Powell is a 49 y.o. male presenting on 06/01/2019 for Follow-up and Fatigue   HPI  Pt had a negative covid 19 screening questionnaire.    Pt is 66yoM with appointment today to follow up on fatigue and review labs  Pt says he has been checking bp at home and he says it frequently  Runs [id number] systollically.  He says he is feeling fine.  He denies HA, CP, vision changes.   He is still taking his friends ativan every day.       Relevant past medical, surgical, family and social history reviewed and updated as indicated. Interim medical history since our last visit reviewed. Allergies and medications reviewed and updated.    Current Outpatient Medications:  .  LORazepam (ATIVAN PO), Take 1 tablet by mouth daily. , Disp: , Rfl:      Review of Systems  Per HPI unless specifically indicated above     Objective:    BP (!) 150/80   Pulse 78   Temp 99.1 F (37.3 C)   Wt 178 lb (80.7 kg)   SpO2 96%   BMI 26.67 kg/m   Wt Readings from Last 3 Encounters:  06/01/19 178 lb (80.7 kg)  02/24/18 175 lb 4 oz (79.5 kg)  02/08/18 175 lb (79.4 kg)    Physical Exam Vitals reviewed.  Constitutional:      General: He is not in acute distress.    Appearance: He is well-developed.  HENT:     Head: Normocephalic and atraumatic.  Cardiovascular:     Rate and Rhythm: Normal rate and regular rhythm.  Pulmonary:     Effort: Pulmonary effort is normal.     Breath sounds: Normal breath sounds. No wheezing.  Abdominal:     General: Bowel sounds are normal.     Palpations: Abdomen is soft.     Tenderness: There is no abdominal tenderness.  Musculoskeletal:     Cervical back: Neck supple.     Right lower leg: No edema.     Left lower leg: No edema.  Lymphadenopathy:    Cervical: No cervical adenopathy.  Skin:    General: Skin is warm and dry.     Comments: Very over-tanned and sun damaged skin with leathery appearance and texture  Neurological:     Mental Status: He is alert and oriented to person, place, and time.  Psychiatric:        Attention and Perception: Attention normal.        Speech: Speech normal.     Results for orders placed or performed during the hospital encounter of 05/24/19  Testosterone,Free and Total  Result Value Ref Range   Testosterone 409 264 - 916 ng/dL   Testosterone, Free 10.9 7.2 - 24.0 pg/mL  Lipid panel  Result Value Ref Range   Cholesterol 217 (H) 0 - 200 mg/dL   Triglycerides 141 <150 mg/dL   HDL 37 (L) >40 mg/dL   Total CHOL/HDL Ratio 5.9 RATIO   VLDL 28 0 - 40 mg/dL   LDL Cholesterol 152 (H) 0 - 99 mg/dL  Comprehensive metabolic panel  Result Value Ref Range   Sodium 142  135 - 145 mmol/L   Potassium 4.2 3.5 - 5.1 mmol/L   Chloride 103 98 - 111 mmol/L   CO2 28 22 - 32 mmol/L   Glucose, Bld 104 (H) 70 - 99 mg/dL   BUN 17 6 - 20 mg/dL   Creatinine, Ser 0.91 0.61 - 1.24 mg/dL   Calcium 9.1 8.9 - 10.3 mg/dL   Total Protein 7.2 6.5 - 8.1 g/dL   Albumin 4.3 3.5 - 5.0 g/dL   AST 16 15 - 41 U/L   ALT 19 0 - 44 U/L   Alkaline Phosphatase 69 38 - 126 U/L   Total Bilirubin 0.5 0.3 - 1.2 mg/dL   GFR calc non Af Amer >60 >60 mL/min   GFR calc Af Amer >60 >60 mL/min   Anion gap 11 5 - 15  PSA  Result Value Ref Range   Prostatic Specific Antigen 0.98 0.00 - 4.00 ng/mL      Assessment & Plan:    Encounter Diagnoses  Name Primary?  . Essential hypertension Yes  . Hyperlipidemia, unspecified hyperlipidemia type   . Tobacco use disorder   . Sun-damaged skin   . Mood disorder ([place])      -reviewed labs with pt  -Counseled pt to use sunscreen to avoid further sun damage -encouraged smoking cessation -Start amlodipine for bp -recomended lowfat diet.  rx lescol.   -significant difficulties selecting  medications as pt cannot swallow pills and requires liquids or capsules (that he can pour over his applesauce) -pt to follow up in 1 month to recheck bp.  He is to contact office sooner prn

## 2019-07-05 ENCOUNTER — Ambulatory Visit: Payer: Self-pay | Admitting: Physician Assistant

## 2019-07-18 ENCOUNTER — Ambulatory Visit: Payer: Self-pay | Admitting: Physician Assistant

## 2019-09-22 ENCOUNTER — Other Ambulatory Visit: Payer: Self-pay

## 2020-02-06 ENCOUNTER — Encounter: Payer: Self-pay | Admitting: Physician Assistant

## 2020-02-06 ENCOUNTER — Ambulatory Visit: Payer: Self-pay | Admitting: Physician Assistant

## 2020-02-06 DIAGNOSIS — E785 Hyperlipidemia, unspecified: Secondary | ICD-10-CM

## 2020-02-06 DIAGNOSIS — I10 Essential (primary) hypertension: Secondary | ICD-10-CM

## 2020-02-06 DIAGNOSIS — F17200 Nicotine dependence, unspecified, uncomplicated: Secondary | ICD-10-CM

## 2020-02-06 DIAGNOSIS — Z125 Encounter for screening for malignant neoplasm of prostate: Secondary | ICD-10-CM

## 2020-02-06 DIAGNOSIS — K0889 Other specified disorders of teeth and supporting structures: Secondary | ICD-10-CM

## 2020-02-06 MED ORDER — RAMIPRIL 5 MG PO CAPS: 5.0000 mg | ORAL_CAPSULE | Freq: Every day | ORAL | 1 refills | Status: DC

## 2020-02-06 MED ORDER — CLINDAMYCIN HCL 300 MG PO CAPS
300.0000 mg | ORAL_CAPSULE | Freq: Three times a day (TID) | ORAL | 0 refills | Status: AC
Start: 2020-02-06 — End: 2020-02-16

## 2020-02-06 NOTE — Progress Notes (Signed)
   There were no vitals taken for this visit.   Subjective:    Patient ID: Caleb Powell, male    DOB: [birth year], 50 y.o.   MRN: [id number]  HPI: Caleb Powell is a 50 y.o. male presenting on 02/06/2020 for No chief complaint on file.   HPI   This is a telemedicine appointment through [employer] due to coronavirus pandemic.  I connected with  Caleb Powell on 02/06/20 by a video enabled telemedicine application and verified that I am speaking with the correct person using two identifiers.   I discussed the limitations of evaluation and management by telemedicine. The patient expressed understanding and agreed to proceed.  Pt is at home.  Provider is at office.    Pt is 73yoM with HTN and dyslipidemia.  He is about 7 months past due for appointment, having cancelled his previously scheduled follow-ups.   He made appointment today due to dentalgia.  [place]     Pt was previously put on anti-hypertensive medication and a statin.  He is currently taking no medications.  He says his Last bp check was 2 or 3 months ago.  Pt says his teeth are bothering him a lot.  He has been seeing a dentist who is unavailable today.     He has not yet gotten the covid vaccination.      Relevant past medical, surgical, family and social history reviewed and updated as indicated. Interim medical history since our last visit reviewed. Allergies and medications reviewed and updated.     CURRENT MEDS: None        Review of Systems  Per HPI unless specifically indicated above     Objective:    There were no vitals taken for this visit.  Wt Readings from Last 3 Encounters:  06/01/19 178 lb (80.7 kg)  02/24/18 175 lb 4 oz (79.5 kg)  02/08/18 175 lb (79.4 kg)    Physical Exam Constitutional:      General: He is not in acute distress.    Appearance: He is not toxic-appearing.  HENT:     Head: Normocephalic and atraumatic.     Comments: No facial swelling.  There is  no drooling.  Pulmonary:     Effort: No respiratory distress.  Neurological:     Mental Status: He is alert and oriented to person, place, and time.  Psychiatric:        Attention and Perception: Attention normal.        Speech: Speech normal.        Behavior: Behavior is cooperative.     Comments: Pt speaks clearly            Assessment & Plan:    Encounter Diagnoses  Name Primary?  . Essential hypertension Yes  . Dentalgia   . Tobacco use disorder      -rx Clindamycin for tooth.  Pt is to contact his dentist for follow up -rx Ramipril which he was on previously.   -encouraged covid vaccination -encouraged smoking cessation -pt to follow up in 1 month to check the blood pressure.  Will update labs before that appointment.  Pt is to contact office sooner prn

## 2020-03-02 ENCOUNTER — Other Ambulatory Visit: Payer: Self-pay

## 2020-03-02 ENCOUNTER — Other Ambulatory Visit (HOSPITAL_COMMUNITY)
Admission: RE | Admit: 2020-03-02 | Discharge: 2020-03-02 | Disposition: A | Discharge status: Home / Self Care | Payer: Medicaid Other | Source: Ambulatory Visit | Attending: Physician Assistant | Admitting: Physician Assistant

## 2020-03-02 DIAGNOSIS — I10 Essential (primary) hypertension: Secondary | ICD-10-CM

## 2020-03-02 DIAGNOSIS — E785 Hyperlipidemia, unspecified: Secondary | ICD-10-CM

## 2020-03-02 DIAGNOSIS — Z125 Encounter for screening for malignant neoplasm of prostate: Secondary | ICD-10-CM

## 2020-03-02 DIAGNOSIS — R69 Illness, unspecified: Secondary | ICD-10-CM | POA: Diagnosis present

## 2020-03-02 LAB — COMPREHENSIVE METABOLIC PANEL
ALT: 20 U/L (ref 0–44)
AST: 18 U/L (ref 15–41)
Albumin: 4.6 g/dL (ref 3.5–5.0)
Alkaline Phosphatase: 61 U/L (ref 38–126)
Anion gap: 9 (ref 5–15)
BUN: 15 mg/dL (ref 6–20)
CO2: 26 mmol/L (ref 22–32)
Calcium: 8.8 mg/dL — ABNORMAL LOW (ref 8.9–10.3)
Chloride: 102 mmol/L (ref 98–111)
Creatinine, Ser: 0.91 mg/dL (ref 0.61–1.24)
GFR, Estimated: >60 mL/min (ref >60)
Glucose, Bld: 119 mg/dL — ABNORMAL HIGH (ref 70–99)
Potassium: 3.9 mmol/L (ref 3.5–5.1)
Sodium: 137 mmol/L (ref 135–145)
Total Bilirubin: 0.6 mg/dL (ref 0.3–1.2)
Total Protein: 7.5 g/dL (ref 6.5–8.1)

## 2020-03-02 LAB — LIPID PANEL
Cholesterol: 251 mg/dL — ABNORMAL HIGH (ref 0–200)
HDL: 36 mg/dL — ABNORMAL LOW (ref >40)
LDL Cholesterol: 158 mg/dL — ABNORMAL HIGH (ref 0–99)
Total CHOL/HDL Ratio: 7 RATIO
Triglycerides: 284 mg/dL — ABNORMAL HIGH (ref <150)
VLDL: 57 mg/dL — ABNORMAL HIGH (ref 0–40)

## 2020-03-02 LAB — PSA: Prostatic Specific Antigen: 1.36 ng/mL (ref 0.00–4.00)

## 2020-03-07 ENCOUNTER — Other Ambulatory Visit: Payer: Self-pay

## 2020-03-07 ENCOUNTER — Encounter: Payer: Self-pay | Admitting: Physician Assistant

## 2020-03-07 ENCOUNTER — Ambulatory Visit: Payer: Self-pay | Admitting: Physician Assistant

## 2020-03-07 VITALS — BP 160/80 | HR 63 | Temp 97.0°F | Wt 173.0 lb

## 2020-03-07 DIAGNOSIS — F17200 Nicotine dependence, unspecified, uncomplicated: Secondary | ICD-10-CM

## 2020-03-07 DIAGNOSIS — Z1211 Encounter for screening for malignant neoplasm of colon: Secondary | ICD-10-CM

## 2020-03-07 DIAGNOSIS — I10 Essential (primary) hypertension: Secondary | ICD-10-CM

## 2020-03-07 DIAGNOSIS — E785 Hyperlipidemia, unspecified: Secondary | ICD-10-CM

## 2020-03-07 NOTE — Progress Notes (Signed)
BP (!) 160/80   Pulse 63   Temp (!) 97 F (36.1 C)   Wt 173 lb (78.5 kg)   SpO2 97%   BMI 25.92 kg/m    Subjective:    Patient ID: Caleb Powell, male    DOB: 10-May-1970, 50 y.o.   MRN: [id number]  HPI: PETROS AHART is a 50 y.o. male presenting on 03/07/2020 for Hypertension and Hyperlipidemia   HPI    Pt had a negative covid 19 screening questionnaire.    Chief Complaint  Patient presents with  . Hypertension  . Hyperlipidemia    Pt is Still smoking.  He is  not vaccinated for covid. He saw ENT in February for sore throat.  He says They think perhaps GERD He has no complaints today except about his ST.   He hasn't been taking his bp medication or his cholesterol medication for a long time.    Relevant past medical, surgical, family and social history reviewed and updated as indicated. Interim medical history since our last visit reviewed. Allergies and medications reviewed and updated.   Current Outpatient Medications:  .  OMEPRAZOLE PO, Take by mouth., Disp: , Rfl:  .  fluvastatin (LESCOL) 20 MG capsule, Take 1 capsule (20 mg total) by mouth at bedtime. (Patient not taking: No sig reported), Disp: 30 capsule, Rfl: 4 .  LORazepam (ATIVAN PO), Take 1 tablet by mouth daily.  (Patient not taking: No sig reported), Disp: , Rfl:  .  ramipril (ALTACE) 5 MG capsule, Take 1 capsule (5 mg total) by mouth daily. (Patient not taking: Reported on 03/07/2020), Disp: 30 capsule, Rfl: 1   Review of Systems  Per HPI unless specifically indicated above     Objective:    BP (!) 160/80   Pulse 63   Temp (!) 97 F (36.1 C)   Wt 173 lb (78.5 kg)   SpO2 97%   BMI 25.92 kg/m   Wt Readings from Last 3 Encounters:  03/07/20 173 lb (78.5 kg)  06/01/19 178 lb (80.7 kg)  02/24/18 175 lb 4 oz (79.5 kg)    Physical Exam Vitals reviewed.  Constitutional:      General: He is not in acute distress.    Appearance: He is well-developed and well-nourished. He is not toxic-appearing.   HENT:     Head: Normocephalic and atraumatic.  Cardiovascular:     Rate and Rhythm: Normal rate and regular rhythm.  Pulmonary:     Effort: Pulmonary effort is normal.     Breath sounds: Normal breath sounds. No wheezing.  Abdominal:     General: Bowel sounds are normal.     Palpations: Abdomen is soft. There is no hepatosplenomegaly.     Tenderness: There is no abdominal tenderness.  Musculoskeletal:        General: No edema.     Cervical back: Neck supple.     Right lower leg: No edema.     Left lower leg: No edema.  Lymphadenopathy:     Cervical: No cervical adenopathy.  Skin:    General: Skin is warm and dry.  Neurological:     Mental Status: He is alert and oriented to person, place, and time.  Psychiatric:        Mood and Affect: Mood and affect normal.        Behavior: Behavior normal.     Results for orders placed or performed during the hospital encounter of 03/02/20  Lipid panel  Result Value  Ref Range   Cholesterol 251 (H) 0 - 200 mg/dL   Triglycerides 284 (H) <150 mg/dL   HDL 36 (L) >40 mg/dL   Total CHOL/HDL Ratio 7.0 RATIO   VLDL 57 (H) 0 - 40 mg/dL   LDL Cholesterol 158 (H) 0 - 99 mg/dL  PSA  Result Value Ref Range   Prostatic Specific Antigen 1.36 0.00 - 4.00 ng/mL  Comprehensive metabolic panel  Result Value Ref Range   Sodium 137 135 - 145 mmol/L   Potassium 3.9 3.5 - 5.1 mmol/L   Chloride 102 98 - 111 mmol/L   CO2 26 22 - 32 mmol/L   Glucose, Bld 119 (H) 70 - 99 mg/dL   BUN 15 6 - 20 mg/dL   Creatinine, Ser 0.91 0.61 - 1.24 mg/dL   Calcium 8.8 (L) 8.9 - 10.3 mg/dL   Total Protein 7.5 6.5 - 8.1 g/dL   Albumin 4.6 3.5 - 5.0 g/dL   AST 18 15 - 41 U/L   ALT 20 0 - 44 U/L   Alkaline Phosphatase 61 38 - 126 U/L   Total Bilirubin 0.6 0.3 - 1.2 mg/dL   GFR, Estimated >60 >60 mL/min   Anion gap 9 5 - 15      Assessment & Plan:   Encounter Diagnoses  Name Primary?  . Essential hypertension Yes  . Hyperlipidemia, unspecified hyperlipidemia  type   . Tobacco use disorder   . Screening for colon cancer       -Reviewed labs with pt  -counseled pt on reasons why we treat htn and dyslipidemia and recommended he restart bp and chol meds.  He still has lescol and ramipril, several months worth, at home. -pt encouraged to Check bp at home.  He is asked to notify office if it continues to run over 130 on the top -encouraged Smoking cessation -educated pt an encouraged pt to get covid vaccination -pt was given ifbot for colon cancer screening -pt to follow up 6 wk for bp check.  He is encouraged to contact office sooner prn

## 2020-03-13 NOTE — Congregational Nurse Program (Signed)
Care coordination note updated to reflect [facility] approval until 02/06/21   Debria Garret RN [place] [facility]

## 2020-04-09 ENCOUNTER — Other Ambulatory Visit: Payer: Self-pay | Admitting: Physician Assistant

## 2020-04-09 DIAGNOSIS — Z1211 Encounter for screening for malignant neoplasm of colon: Secondary | ICD-10-CM

## 2020-04-09 LAB — IFOBT (OCCULT BLOOD): IFOBT: NEGATIVE

## 2020-04-17 ENCOUNTER — Other Ambulatory Visit: Payer: Self-pay

## 2020-04-17 ENCOUNTER — Encounter: Payer: Self-pay | Admitting: Physician Assistant

## 2020-04-17 ENCOUNTER — Ambulatory Visit: Payer: Self-pay | Admitting: Physician Assistant

## 2020-04-17 VITALS — BP 154/67 | HR 71 | Temp 96.7°F | Wt 175.0 lb

## 2020-04-17 DIAGNOSIS — F17200 Nicotine dependence, unspecified, uncomplicated: Secondary | ICD-10-CM

## 2020-04-17 DIAGNOSIS — J029 Acute pharyngitis, unspecified: Secondary | ICD-10-CM

## 2020-04-17 DIAGNOSIS — I10 Essential (primary) hypertension: Secondary | ICD-10-CM

## 2020-04-17 DIAGNOSIS — K219 Gastro-esophageal reflux disease without esophagitis: Secondary | ICD-10-CM

## 2020-04-17 MED ORDER — OMEPRAZOLE 40 MG PO CPDR: 40.0000 mg | DELAYED_RELEASE_CAPSULE | Freq: Two times a day (BID) | ORAL | 3 refills | Status: DC

## 2020-04-17 MED ORDER — AMLODIPINE BESYLATE 5 MG PO TABS: 5.0000 mg | ORAL_TABLET | Freq: Every day | ORAL | 3 refills | Status: DC

## 2020-04-17 NOTE — Patient Instructions (Addendum)
Stop the ramipril.   Start amlodipine Increase the omeprazole to twice daily Continue fluvastatin.  --------------------------------------------------      [employer]'s Current Therapy 2021 (pp. 213-216). [place], [place]: Elsevier.">  Gastroesophageal Reflux Disease, Adult Gastroesophageal reflux (GER) happens when acid from the stomach flows up into the tube that connects the mouth and the stomach (esophagus). Normally, food travels down the esophagus and stays in the stomach to be digested. However, when a person has GER, food and stomach acid sometimes move back up into the esophagus. If this becomes a more serious problem, the person may be diagnosed with a disease called gastroesophageal reflux disease (GERD). GERD occurs when the reflux:  Happens often.  Causes frequent or severe symptoms.  Causes problems such as damage to the esophagus. When stomach acid comes in contact with the esophagus, the acid may cause inflammation in the esophagus. Over time, GERD may create small holes (ulcers) in the lining of the esophagus. What are the causes? This condition is caused by a problem with the muscle between the esophagus and the stomach (lower esophageal sphincter, or LES). Normally, the LES muscle closes after food passes through the esophagus to the stomach. When the LES is weakened or abnormal, it does not close properly, and that allows food and stomach acid to go back up into the esophagus. The LES can be weakened by certain dietary substances, medicines, and medical conditions, including:  Tobacco use.  Pregnancy.  Having a hiatal hernia.  Alcohol use.  Certain foods and beverages, such as coffee, chocolate, onions, and peppermint. What increases the risk? You are more likely to develop this condition if you:  Have an increased body weight.  Have a connective tissue disorder.  Take NSAIDs, such as ibuprofen. What are the signs or symptoms? Symptoms of this condition  include:  Heartburn.  Difficult or painful swallowing and the feeling of having a lump in the throat.  A bitter taste in the mouth.  Bad breath and having a large amount of saliva.  Having an upset or bloated stomach and belching.  Chest pain. Different conditions can cause chest pain. Make sure you see your health care provider if you experience chest pain.  Shortness of breath or wheezing.  Ongoing (chronic) cough or a nighttime cough.  Wearing away of tooth enamel.  Weight loss. How is this diagnosed? This condition may be diagnosed based on a medical history and a physical exam. To determine if you have mild or severe GERD, your health care provider may also monitor how you respond to treatment. You may also have tests, including:  A test to examine your stomach and esophagus with a small camera (endoscopy).  A test that measures the acidity level in your esophagus.  A test that measures how much pressure is on your esophagus.  A barium swallow or modified barium swallow test to show the shape, size, and functioning of your esophagus. How is this treated? Treatment for this condition may vary depending on how severe your symptoms are. Your health care provider may recommend:  Changes to your diet.  Medicine.  Surgery. The goal of treatment is to help relieve your symptoms and to prevent complications. Follow these instructions at home: Eating and drinking  Follow a diet as recommended by your health care provider. This may involve avoiding foods and drinks such as: ? Coffee and tea, with or without caffeine. ? Drinks that contain alcohol. ? Energy drinks and sports drinks. ? Carbonated drinks or sodas. ? Chocolate  and cocoa. ? Peppermint and mint flavorings. ? Garlic and onions. ? Horseradish. ? Spicy and acidic foods, including peppers, chili powder, curry powder, vinegar, hot sauces, and barbecue sauce. ? Citrus fruit juices and citrus fruits, such as  oranges, lemons, and limes. ? Tomato-based foods, such as red sauce, chili, salsa, and pizza with red sauce. ? Fried and fatty foods, such as donuts, french fries, potato chips, and high-fat dressings. ? High-fat meats, such as hot dogs and fatty cuts of red and white meats, such as rib eye steak, sausage, ham, and bacon. ? High-fat dairy items, such as whole milk, butter, and cream cheese.  Eat small, frequent meals instead of large meals.  Avoid drinking large amounts of liquid with your meals.  Avoid eating meals during the 2-3 hours before bedtime.  Avoid lying down right after you eat.  Do not exercise right after you eat.   Lifestyle  Do not use any products that contain nicotine or tobacco. These products include cigarettes, chewing tobacco, and vaping devices, such as e-cigarettes. If you need help quitting, ask your health care provider.  Try to reduce your stress by using methods such as yoga or meditation. If you need help reducing stress, ask your health care provider.  If you are overweight, reduce your weight to an amount that is healthy for you. Ask your health care provider for guidance about a safe weight loss goal.   General instructions  Pay attention to any changes in your symptoms.  Take over-the-counter and prescription medicines only as told by your health care provider. Do not take aspirin, ibuprofen, or other NSAIDs unless your health care provider told you to take these medicines.  Wear loose-fitting clothing. Do not wear anything tight around your waist that causes pressure on your abdomen.  Raise (elevate) the head of your bed about 6 inches (15 cm). You can use a wedge to do this.  Avoid bending over if this makes your symptoms worse.  Keep all follow-up visits. This is important. Contact a health care provider if:  You have: ? New symptoms. ? Unexplained weight loss. ? Difficulty swallowing or it hurts to swallow. ? Wheezing or a persistent  cough. ? A hoarse voice.  Your symptoms do not improve with treatment. Get help right away if:  You have sudden pain in your arms, neck, jaw, teeth, or back.  You suddenly feel sweaty, dizzy, or light-headed.  You have chest pain or shortness of breath.  You vomit and the vomit is green, yellow, or black, or it looks like blood or coffee grounds.  You faint.  You have stool that is red, bloody, or black.  You cannot swallow, drink, or eat. These symptoms may represent a serious problem that is an emergency. Do not wait to see if the symptoms will go away. Get medical help right away. Call your local emergency services (911 in the [place] Do not drive yourself to the hospital. Summary  Gastroesophageal reflux happens when acid from the stomach flows up into the esophagus. GERD is a disease in which the reflux happens often, causes frequent or severe symptoms, or causes problems such as damage to the esophagus.  Treatment for this condition may vary depending on how severe your symptoms are. Your health care provider may recommend diet and lifestyle changes, medicine, or surgery.  Contact a health care provider if you have new or worsening symptoms.  Take over-the-counter and prescription medicines only as told by your health care provider.  Do not take aspirin, ibuprofen, or other NSAIDs unless your health care provider told you to do so.  Keep all follow-up visits as told by your health care provider. This is important. This information is not intended to replace advice given to you by your health care provider. Make sure you discuss any questions you have with your health care provider. Document Revised: 07/04/2019 Document Reviewed: 07/04/2019 Elsevier Patient Education  [place].

## 2020-04-17 NOTE — Progress Notes (Signed)
BP (!) 154/67   Pulse 71   Temp (!) 96.7 F (35.9 C)   Wt 175 lb (79.4 kg)   SpO2 97%   BMI 26.22 kg/m    Subjective:    Patient ID: Caleb Powell, male    DOB: Jan 25, 1970, 50 y.o.   MRN: [id number]  HPI: Caleb Powell is a 50 y.o. male presenting on 04/17/2020 for Hypertension   HPI  Pt had a negative covid 19 screening questionnaire.  Chief Complaint  Patient presents with  . Hypertension     Pt was referred to ENT for chronic sore throat.  ENT notes reviewed.  ENT notes says ppi was supposed to be increased to bid but pt didn't increase it and is still taking the omeprazole qd.   Laryngoscopy on 2/10-  Normal.    Pt doesn't really think that his ST ist reflux.  He has googled and thinks its either a Thyroid problem, meningitis or myasthenia gravis  He says he doesn't have follow up appointment scheduled with ENT.     Pt says he was taking his bp medication but then he was coughing a lot.    Pt is still not vaccinated for covid.  Pt also having HA at times.    He says he is continuing to smoke 2 1/2 ppd.      Relevant past medical, surgical, family and social history reviewed and updated as indicated. Interim medical history since our last visit reviewed. Allergies and medications reviewed and updated.    Current Outpatient Medications:  .  fluvastatin (LESCOL) 20 MG capsule, Take 1 capsule (20 mg total) by mouth at bedtime., Disp: 30 capsule, Rfl: 4 .  LORazepam (ATIVAN PO), Take 1 tablet by mouth daily., Disp: , Rfl:  .  OMEPRAZOLE PO, Take 40 mg by mouth daily., Disp: , Rfl:  .  ramipril (ALTACE) 5 MG capsule, Take 1 capsule (5 mg total) by mouth daily., Disp: 30 capsule, Rfl: 1        Review of Systems  Per HPI unless specifically indicated above     Objective:    BP (!) 154/67   Pulse 71   Temp (!) 96.7 F (35.9 C)   Wt 175 lb (79.4 kg)   SpO2 97%   BMI 26.22 kg/m   Wt Readings from Last 3 Encounters:  04/17/20 175 lb (79.4 kg)   03/07/20 173 lb (78.5 kg)  06/01/19 178 lb (80.7 kg)    Physical Exam Constitutional:      General: He is not in acute distress.    Appearance: He is not toxic-appearing.  HENT:     Head: Normocephalic and atraumatic.  Cardiovascular:     Rate and Rhythm: Normal rate and regular rhythm.  Pulmonary:     Effort: Pulmonary effort is normal. No respiratory distress.     Breath sounds: No stridor. No wheezing or rhonchi.  Abdominal:     General: Bowel sounds are normal.     Palpations: Abdomen is soft. There is no mass.     Tenderness: There is no abdominal tenderness.  Musculoskeletal:     Cervical back: Neck supple. No tenderness.     Right lower leg: No edema.     Left lower leg: No edema.  Lymphadenopathy:     Cervical: No cervical adenopathy.  Skin:    General: Skin is warm and dry.  Neurological:     Mental Status: He is alert and oriented to person, place, and  time.     Gait: Gait is intact.  Psychiatric:        Attention and Perception: Attention normal.        Speech: Speech normal.        Behavior: Behavior is cooperative.     Results for orders placed or performed in visit on 04/09/20  IFOBT POC (occult bld, rslt in office)  Result Value Ref Range   IFOBT Negative       Assessment & Plan:    Encounter Diagnoses  Name Primary?  . Essential hypertension Yes  . Tobacco use disorder   . Sore throat   . Gastroesophageal reflux disease, unspecified whether esophagitis present     -reviewed results colon cancer screening test with pt -will Stop the ramipril due to cough.  rx amlodipine -pt is counseled to Increase the omeprazole to bid.  Pt is counseled at length on lifestyle changes to help GERD.  Discussed with pt that this is the likely cause of his ST, not the things he found on his google search.  Discussed with pt the role that smoking is likely playing with his ST including direct irritation as well as the relaxation of the esophageal sphincter that can  increase the acid in his esophagus.   -Counseled on smoking cessation -pt is encouraged to schedule follow up appointment with ENT -pt to follow up 1 month to recheck bp and ST.  He is to contact office sooner prn

## 2020-05-02 ENCOUNTER — Other Ambulatory Visit: Payer: Self-pay | Admitting: Physician Assistant

## 2020-05-02 DIAGNOSIS — I10 Essential (primary) hypertension: Secondary | ICD-10-CM

## 2020-05-02 DIAGNOSIS — E785 Hyperlipidemia, unspecified: Secondary | ICD-10-CM

## 2020-05-17 ENCOUNTER — Ambulatory Visit: Payer: Self-pay | Admitting: Physician Assistant

## 2020-05-24 ENCOUNTER — Encounter: Payer: Self-pay | Admitting: Physician Assistant

## 2020-06-13 ENCOUNTER — Other Ambulatory Visit (HOSPITAL_COMMUNITY)
Admission: RE | Admit: 2020-06-13 | Discharge: 2020-06-13 | Disposition: A | Discharge status: Home / Self Care | Payer: Medicaid Other | Source: Ambulatory Visit | Attending: Family Medicine | Admitting: Family Medicine

## 2020-06-13 ENCOUNTER — Other Ambulatory Visit: Payer: Self-pay

## 2020-06-13 DIAGNOSIS — Z7189 Other specified counseling: Secondary | ICD-10-CM | POA: Insufficient documentation

## 2020-06-13 DIAGNOSIS — E782 Mixed hyperlipidemia: Secondary | ICD-10-CM | POA: Insufficient documentation

## 2020-06-13 DIAGNOSIS — Z79899 Other long term (current) drug therapy: Secondary | ICD-10-CM | POA: Insufficient documentation

## 2020-06-13 DIAGNOSIS — I10 Essential (primary) hypertension: Secondary | ICD-10-CM | POA: Insufficient documentation

## 2020-06-13 LAB — LIPID PANEL
Cholesterol: 239 mg/dL — ABNORMAL HIGH (ref 0–200)
HDL: 43 mg/dL (ref >40)
LDL Cholesterol: 140 mg/dL — ABNORMAL HIGH (ref 0–99)
Total CHOL/HDL Ratio: 5.6 RATIO
Triglycerides: 280 mg/dL — ABNORMAL HIGH (ref <150)
VLDL: 56 mg/dL — ABNORMAL HIGH (ref 0–40)

## 2020-06-13 LAB — CBC
HCT: 49.2 % (ref 39.0–52.0)
Hemoglobin: 16.1 g/dL (ref 13.0–17.0)
MCH: 30.8 pg (ref 26.0–34.0)
MCHC: 32.7 g/dL (ref 30.0–36.0)
MCV: 94.1 fL (ref 80.0–100.0)
Platelets: 257 10*3/uL (ref 150–400)
RBC: 5.23 MIL/uL (ref 4.22–5.81)
RDW: 12.1 % (ref 11.5–15.5)
WBC: 9.2 10*3/uL (ref 4.0–10.5)
nRBC: 0 % (ref 0.0–0.2)

## 2020-06-13 LAB — C-REACTIVE PROTEIN: CRP: 0.6 mg/dL (ref <1.0)

## 2020-06-13 LAB — SEDIMENTATION RATE: Sed Rate: 5 mm/hr (ref 0–16)

## 2020-06-13 LAB — TSH: TSH: 2.024 u[IU]/mL (ref 0.350–4.500)

## 2020-06-19 LAB — HEMOGLOBIN A1C
Hgb A1c MFr Bld: 5.6 % (ref 4.8–5.6)
Mean Plasma Glucose: 114 mg/dL

## 2020-07-16 ENCOUNTER — Other Ambulatory Visit (HOSPITAL_COMMUNITY): Payer: Self-pay | Admitting: Family Medicine

## 2020-07-16 ENCOUNTER — Other Ambulatory Visit: Payer: Self-pay | Admitting: Family Medicine

## 2020-07-16 DIAGNOSIS — R590 Localized enlarged lymph nodes: Secondary | ICD-10-CM

## 2020-07-23 ENCOUNTER — Other Ambulatory Visit: Payer: Self-pay

## 2020-07-23 ENCOUNTER — Ambulatory Visit (HOSPITAL_COMMUNITY)
Admission: RE | Admit: 2020-07-23 | Discharge: 2020-07-23 | Disposition: A | Discharge status: Home / Self Care | Payer: Medicaid Other | Source: Ambulatory Visit | Attending: Family Medicine | Admitting: Family Medicine

## 2020-07-23 DIAGNOSIS — R590 Localized enlarged lymph nodes: Secondary | ICD-10-CM | POA: Insufficient documentation

## 2020-07-23 IMAGING — US US THYROID
1 series · 14 of 25 positions shown · non-contrast
Comparison: None.

CLINICAL DATA: Enlarged neck lymph node

EXAM:
THYROID ULTRASOUND
TECHNIQUE: Ultrasound examination of the thyroid gland and adjacent soft
tissues was performed.

[Series 1: us thyroid · 14 of 60 slices shown]
[im 1/60]
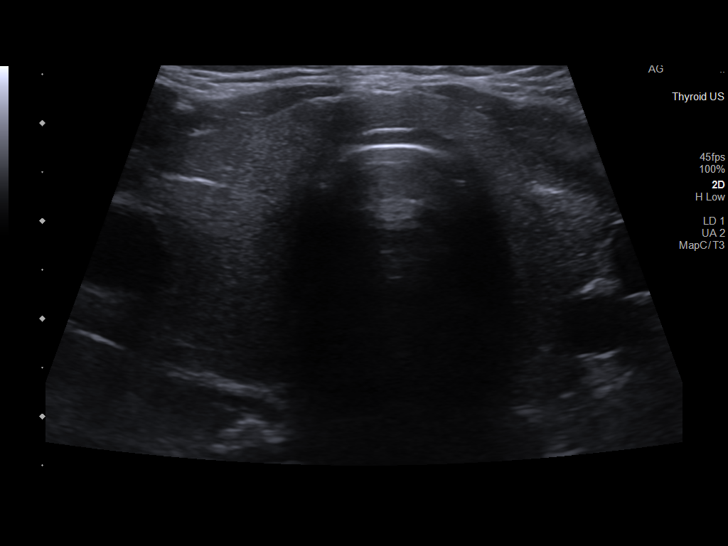
[im 5/60]
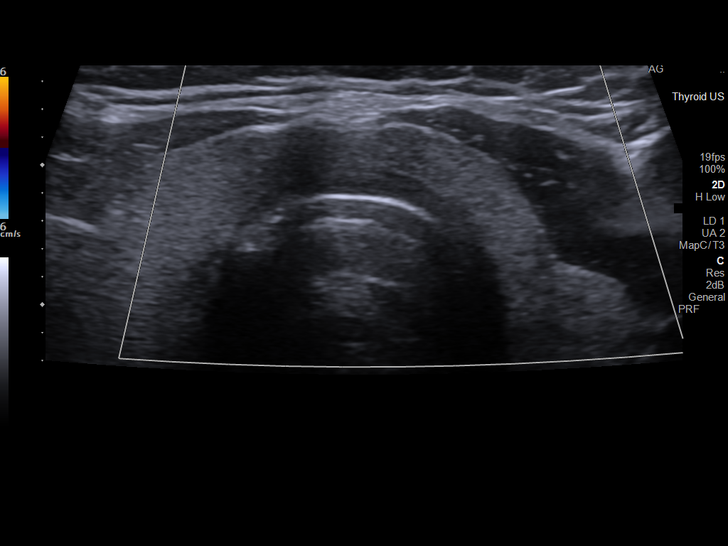
[im 10/60]
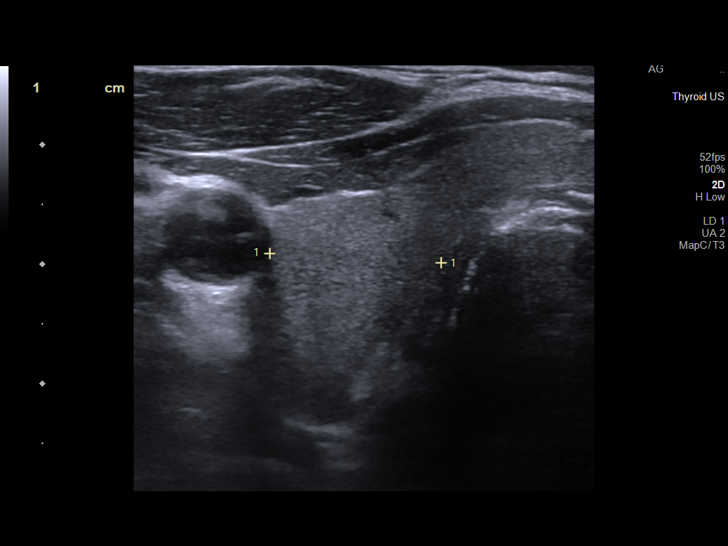
[im 15/60]
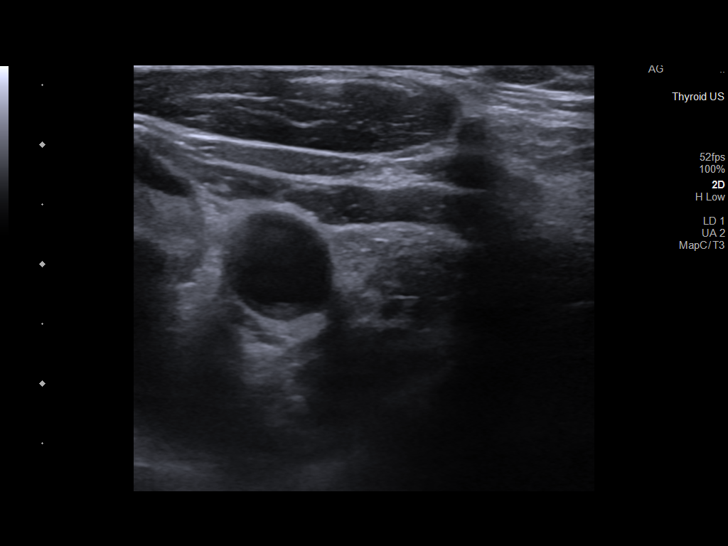
[im 20/60]
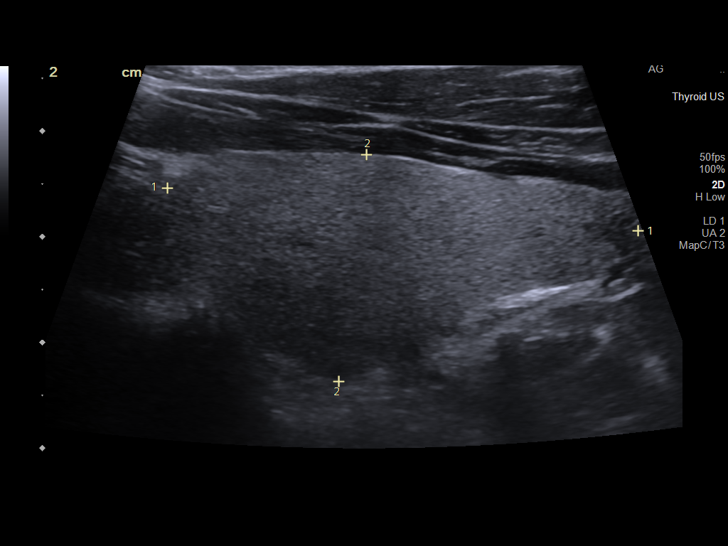
[im 23/60]
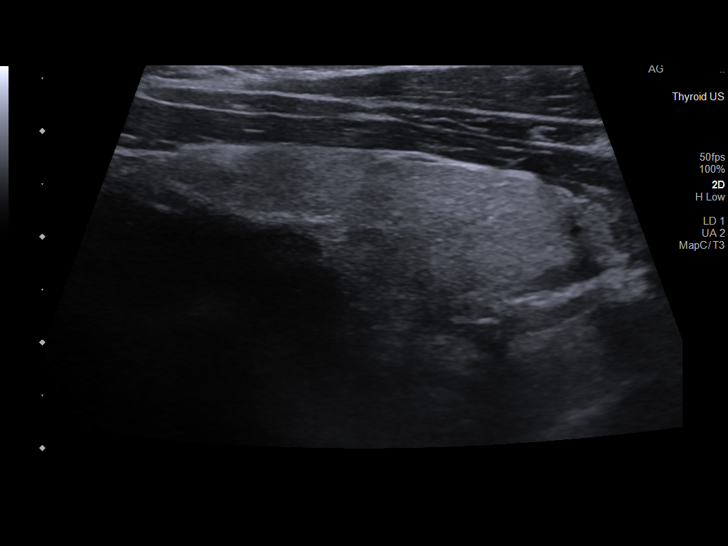
[im 28/60]
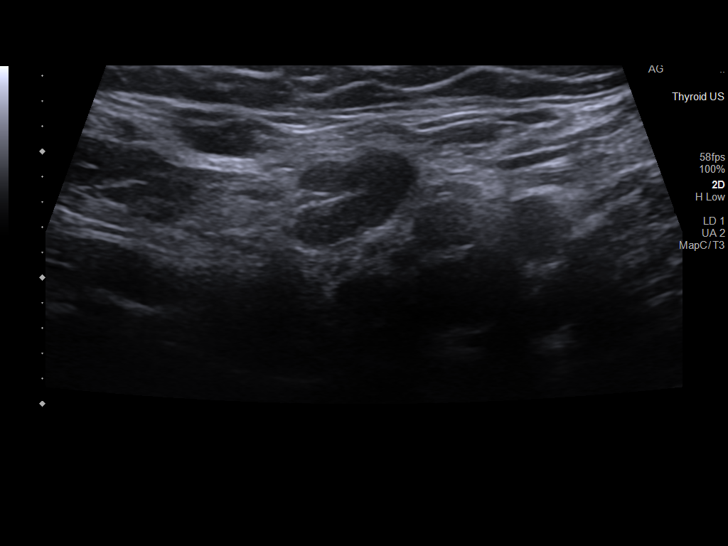
[im 32/60]
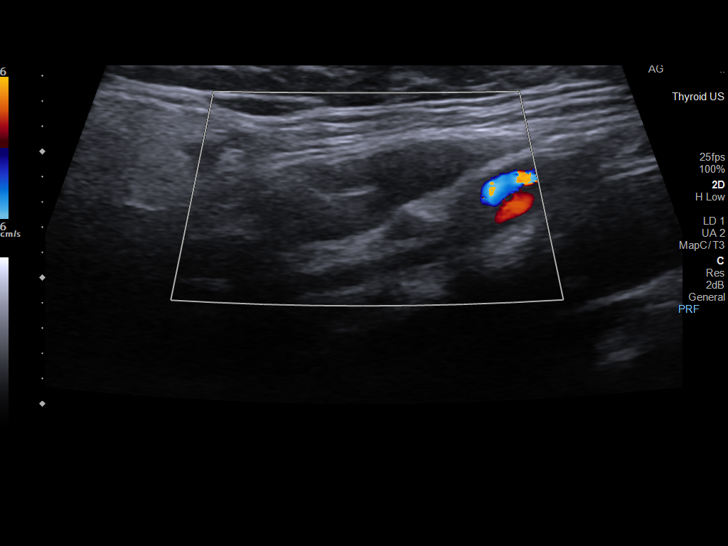
[im 37/60]
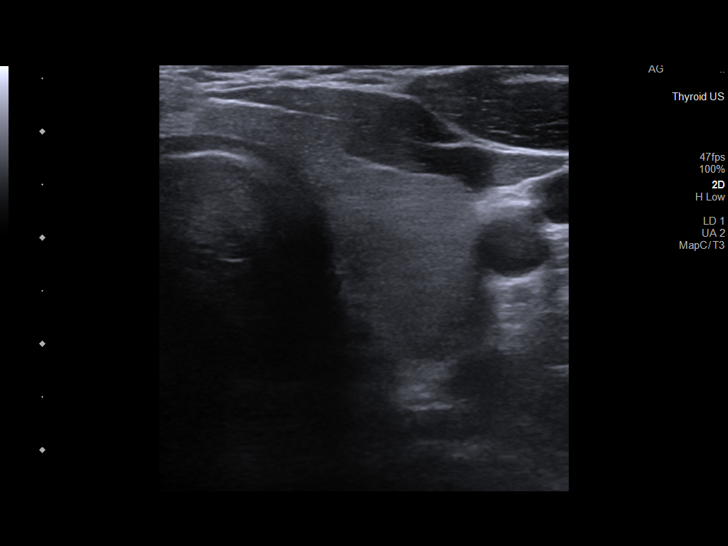
[im 40/60]
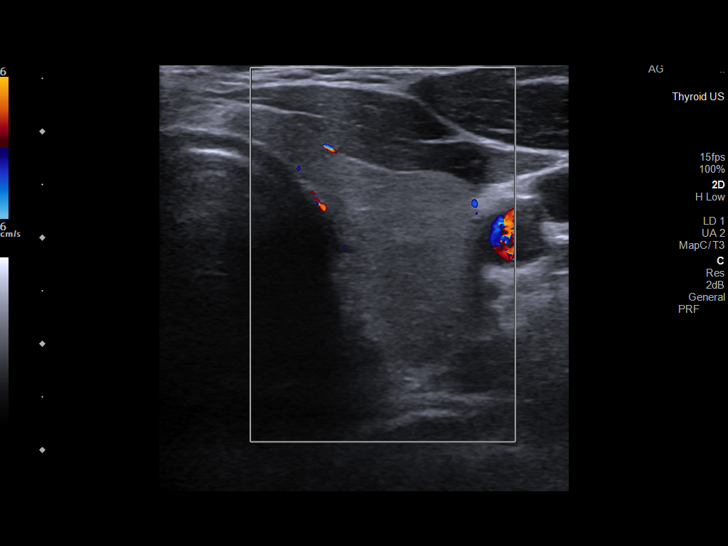
[im 45/60]
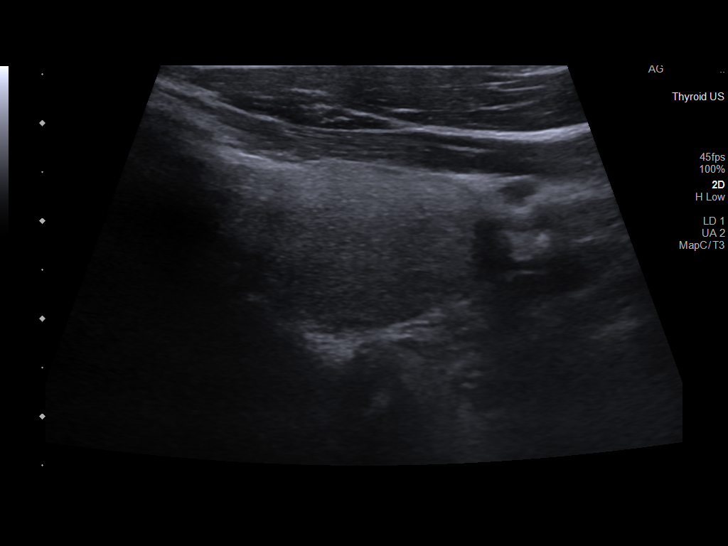
[im 50/60]
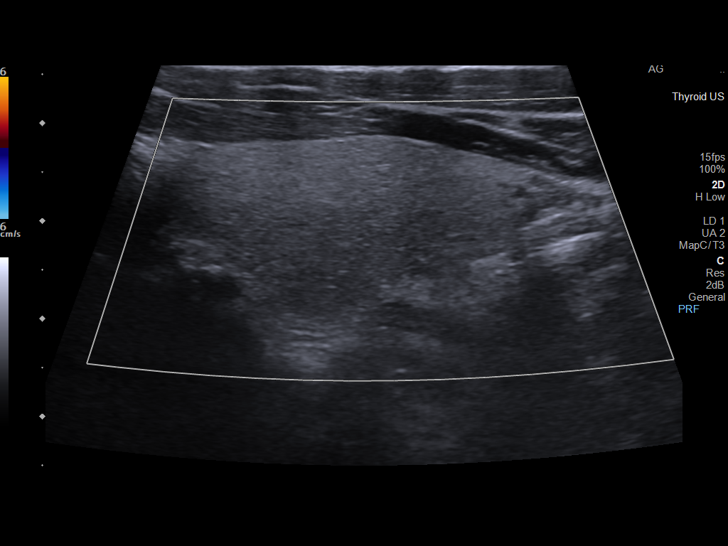
[im 55/60]
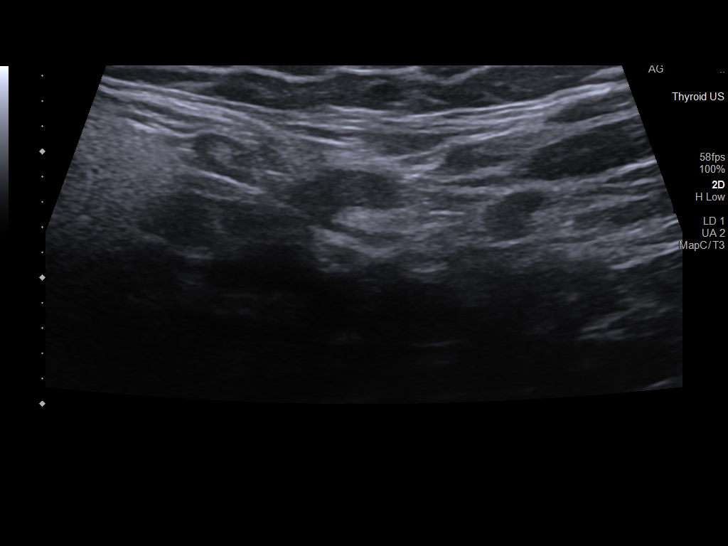
[im 60/60]
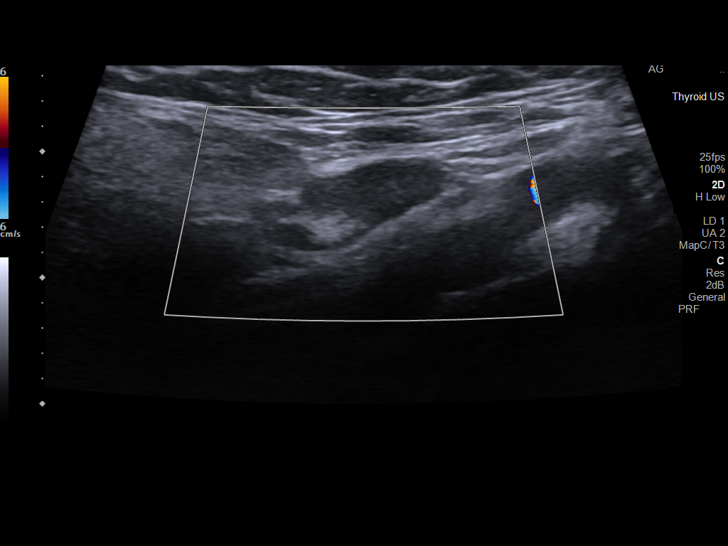

[14 of 25 positions shown; findings below may reference images not displayed]

FINDINGS: Parenchymal Echotexture: Normal

Isthmus: 0.4 cm

Right lobe: 4.5 x 2.2 x 1.4 cm

Left lobe: 3.7 x 2.0 x 1.5 cm

_________________________________________________________

Estimated total number of nodules >/= 1 cm: 0

Number of spongiform nodules >/=  2 cm not described below (TR1): 0

Number of mixed cystic and solid nodules >/= 1.5 cm not described
below (TR2): 0

_________________________________________________________

No discrete nodules are seen within the thyroid gland.

No enlarged neck lymph nodes.
IMPRESSION: No significant sonographic abnormality of the thyroid.

No enlarged neck lymph nodes.

The above is in keeping with the ACR TI-RADS recommendations - [HOSPITAL] [I4];[DATE].

## 2020-08-06 HISTORY — PX: OTHER SURGICAL HISTORY: SHX169

## 2020-08-11 DIAGNOSIS — R030 Elevated blood-pressure reading, without diagnosis of hypertension: Secondary | ICD-10-CM | POA: Diagnosis present

## 2020-08-11 DIAGNOSIS — Z5321 Procedure and treatment not carried out due to patient leaving prior to being seen by health care provider: Secondary | ICD-10-CM | POA: Insufficient documentation

## 2020-08-11 NOTE — ED Triage Notes (Signed)
Brought in by [facility] with c/o HTN @ home. BP reading when EMS arrived wass 218/90. BP on arrival to ER with EMS was 195/95.

## 2020-08-12 ENCOUNTER — Emergency Department (HOSPITAL_COMMUNITY)
Admission: EM | Admit: 2020-08-12 | Discharge: 2020-08-12 | Disposition: A | Discharge status: Home / Self Care | Payer: Medicaid Other | Attending: Emergency Medicine | Admitting: Emergency Medicine

## 2020-08-12 LAB — CBC WITH DIFFERENTIAL/PLATELET
Abs Immature Granulocytes: 0.03 10*3/uL (ref 0.00–0.07)
Basophils Absolute: 0.1 10*3/uL (ref 0.0–0.1)
Basophils Relative: 1 %
Eosinophils Absolute: 0.1 10*3/uL (ref 0.0–0.5)
Eosinophils Relative: 1 %
HCT: 46.5 % (ref 39.0–52.0)
Hemoglobin: 15.4 g/dL (ref 13.0–17.0)
Immature Granulocytes: 0 %
Lymphocytes Relative: 23 %
Lymphs Abs: 2.6 10*3/uL (ref 0.7–4.0)
MCH: 31 pg (ref 26.0–34.0)
MCHC: 33.1 g/dL (ref 30.0–36.0)
MCV: 93.6 fL (ref 80.0–100.0)
Monocytes Absolute: 0.8 10*3/uL (ref 0.1–1.0)
Monocytes Relative: 7 %
Neutro Abs: 7.6 10*3/uL (ref 1.7–7.7)
Neutrophils Relative %: 68 %
Platelets: 275 10*3/uL (ref 150–400)
RBC: 4.97 MIL/uL (ref 4.22–5.81)
RDW: 12.6 % (ref 11.5–15.5)
WBC: 11.2 10*3/uL — ABNORMAL HIGH (ref 4.0–10.5)
nRBC: 0 % (ref 0.0–0.2)

## 2020-08-12 LAB — COMPREHENSIVE METABOLIC PANEL
ALT: 18 U/L (ref 0–44)
AST: 18 U/L (ref 15–41)
Albumin: 4.5 g/dL (ref 3.5–5.0)
Alkaline Phosphatase: 87 U/L (ref 38–126)
Anion gap: 10 (ref 5–15)
BUN: 18 mg/dL (ref 6–20)
CO2: 26 mmol/L (ref 22–32)
Calcium: 8.8 mg/dL — ABNORMAL LOW (ref 8.9–10.3)
Chloride: 104 mmol/L (ref 98–111)
Creatinine, Ser: 1.11 mg/dL (ref 0.61–1.24)
GFR, Estimated: >60 mL/min (ref >60)
Glucose, Bld: 110 mg/dL — ABNORMAL HIGH (ref 70–99)
Potassium: 3.8 mmol/L (ref 3.5–5.1)
Sodium: 140 mmol/L (ref 135–145)
Total Bilirubin: 0.6 mg/dL (ref 0.3–1.2)
Total Protein: 7.3 g/dL (ref 6.5–8.1)

## 2020-08-12 LAB — TROPONIN I (HIGH SENSITIVITY): Troponin I (High Sensitivity): 9 ng/L (ref <18)

## 2020-08-28 ENCOUNTER — Telehealth: Payer: Medicaid Other | Admitting: Emergency Medicine

## 2020-08-28 ENCOUNTER — Emergency Department (HOSPITAL_COMMUNITY): Payer: Medicaid Other

## 2020-08-28 ENCOUNTER — Encounter (HOSPITAL_COMMUNITY): Payer: Self-pay

## 2020-08-28 ENCOUNTER — Other Ambulatory Visit: Payer: Self-pay

## 2020-08-28 ENCOUNTER — Observation Stay (HOSPITAL_COMMUNITY)
Admission: EM | Admit: 2020-08-28 | Discharge: 2020-08-29 | Disposition: A | Discharge status: Home / Self Care | Payer: Medicaid Other | Attending: Internal Medicine | Admitting: Internal Medicine

## 2020-08-28 DIAGNOSIS — D72829 Elevated white blood cell count, unspecified: Secondary | ICD-10-CM | POA: Diagnosis present

## 2020-08-28 DIAGNOSIS — Z79899 Other long term (current) drug therapy: Secondary | ICD-10-CM | POA: Diagnosis not present

## 2020-08-28 DIAGNOSIS — R42 Dizziness and giddiness: Secondary | ICD-10-CM | POA: Diagnosis present

## 2020-08-28 DIAGNOSIS — G459 Transient cerebral ischemic attack, unspecified: Principal | ICD-10-CM | POA: Diagnosis present

## 2020-08-28 DIAGNOSIS — F17210 Nicotine dependence, cigarettes, uncomplicated: Secondary | ICD-10-CM | POA: Insufficient documentation

## 2020-08-28 DIAGNOSIS — U071 COVID-19: Secondary | ICD-10-CM | POA: Diagnosis not present

## 2020-08-28 DIAGNOSIS — R079 Chest pain, unspecified: Secondary | ICD-10-CM | POA: Diagnosis present

## 2020-08-28 DIAGNOSIS — I169 Hypertensive crisis, unspecified: Secondary | ICD-10-CM | POA: Diagnosis present

## 2020-08-28 DIAGNOSIS — Z720 Tobacco use: Secondary | ICD-10-CM | POA: Diagnosis present

## 2020-08-28 DIAGNOSIS — H547 Unspecified visual loss: Secondary | ICD-10-CM

## 2020-08-28 LAB — CBC WITH DIFFERENTIAL/PLATELET
Abs Immature Granulocytes: 0.09 10*3/uL — ABNORMAL HIGH (ref 0.00–0.07)
Basophils Absolute: 0.1 10*3/uL (ref 0.0–0.1)
Basophils Relative: 1 %
Eosinophils Absolute: 0.1 10*3/uL (ref 0.0–0.5)
Eosinophils Relative: 1 %
HCT: 47.6 % (ref 39.0–52.0)
Hemoglobin: 16 g/dL (ref 13.0–17.0)
Immature Granulocytes: 1 %
Lymphocytes Relative: 8 %
Lymphs Abs: 1 10*3/uL (ref 0.7–4.0)
MCH: 30.6 pg (ref 26.0–34.0)
MCHC: 33.6 g/dL (ref 30.0–36.0)
MCV: 91 fL (ref 80.0–100.0)
Monocytes Absolute: 1.2 10*3/uL — ABNORMAL HIGH (ref 0.1–1.0)
Monocytes Relative: 10 %
Neutro Abs: 10 10*3/uL — ABNORMAL HIGH (ref 1.7–7.7)
Neutrophils Relative %: 79 %
Platelets: 270 10*3/uL (ref 150–400)
RBC: 5.23 MIL/uL (ref 4.22–5.81)
RDW: 11.9 % (ref 11.5–15.5)
WBC: 12.5 10*3/uL — ABNORMAL HIGH (ref 4.0–10.5)
nRBC: 0 % (ref 0.0–0.2)

## 2020-08-28 LAB — COMPREHENSIVE METABOLIC PANEL
ALT: 18 U/L (ref 0–44)
AST: 15 U/L (ref 15–41)
Albumin: 4.8 g/dL (ref 3.5–5.0)
Alkaline Phosphatase: 81 U/L (ref 38–126)
Anion gap: 9 (ref 5–15)
BUN: 16 mg/dL (ref 6–20)
CO2: 26 mmol/L (ref 22–32)
Calcium: 9.1 mg/dL (ref 8.9–10.3)
Chloride: 101 mmol/L (ref 98–111)
Creatinine, Ser: 0.98 mg/dL (ref 0.61–1.24)
GFR, Estimated: >60 mL/min (ref >60)
Glucose, Bld: 94 mg/dL (ref 70–99)
Potassium: 3.5 mmol/L (ref 3.5–5.1)
Sodium: 136 mmol/L (ref 135–145)
Total Bilirubin: 0.5 mg/dL (ref 0.3–1.2)
Total Protein: 7.9 g/dL (ref 6.5–8.1)

## 2020-08-28 LAB — URINALYSIS, ROUTINE W REFLEX MICROSCOPIC
Bacteria, UA: NONE SEEN
Bilirubin Urine: NEGATIVE
Glucose, UA: NEGATIVE mg/dL
Ketones, ur: NEGATIVE mg/dL
Leukocytes,Ua: NEGATIVE
Nitrite: NEGATIVE
Protein, ur: NEGATIVE mg/dL
Specific Gravity, Urine: 1.005 (ref 1.005–1.030)
pH: 6 (ref 5.0–8.0)

## 2020-08-28 LAB — TROPONIN I (HIGH SENSITIVITY)
Troponin I (High Sensitivity): 5 ng/L (ref <18)
Troponin I (High Sensitivity): 6 ng/L (ref <18)

## 2020-08-28 LAB — PROTIME-INR
INR: 1.1 (ref 0.8–1.2)
Prothrombin Time: 13.9 seconds (ref 11.4–15.2)

## 2020-08-28 IMAGING — DX DG CHEST 1V PORT
1 series · 1 of 1 positions shown · non-contrast
Comparison: None.

CLINICAL DATA: Chest pain, dyspnea

EXAM:
PORTABLE CHEST 1 VIEW

[chest ap]
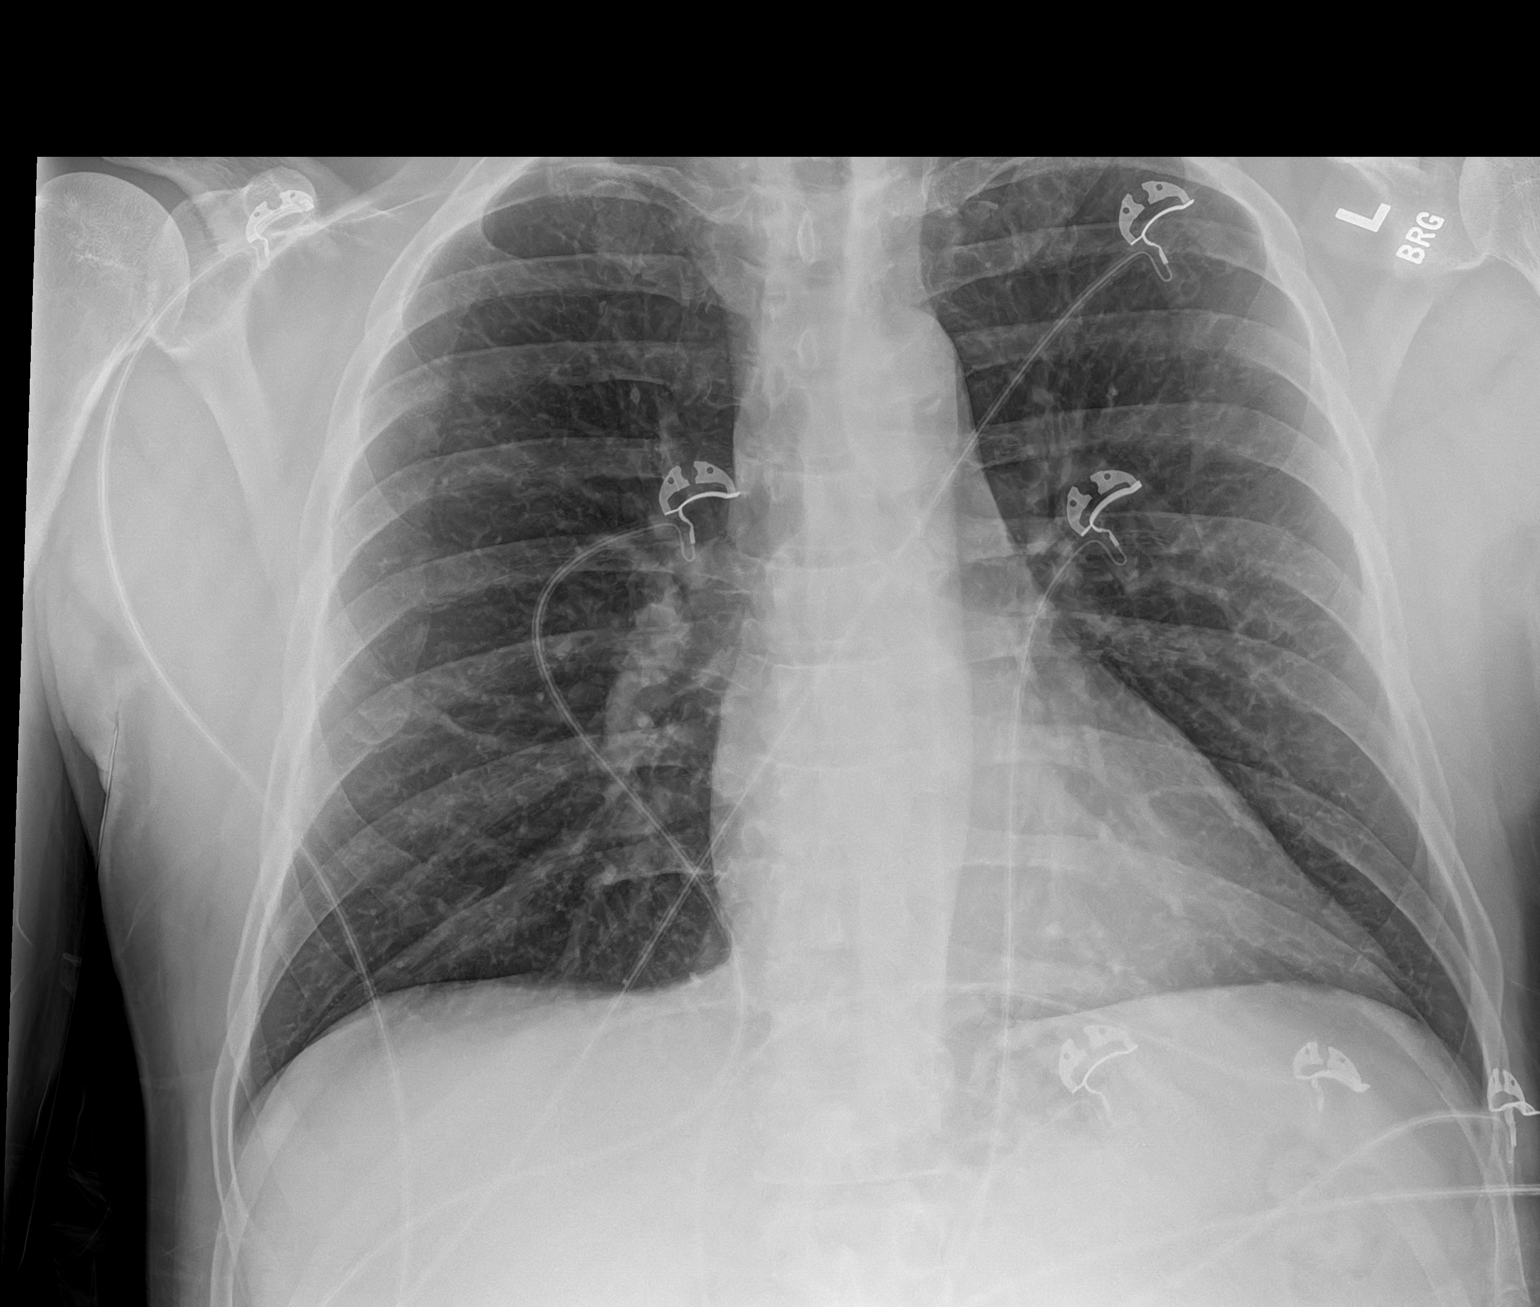

[1 of 1 positions shown; findings below may reference images not displayed]

FINDINGS: Lungs are clear. No pneumothorax or pleural effusion. Cardiac size
within normal limits. Pulmonary vascularity is normal. Healed left
mid clavicular fracture noted. No acute bone abnormality
IMPRESSION: No active disease.

## 2020-08-28 IMAGING — CT CT HEAD W/O CM
3 series · 16 of 47 positions shown, 19 images · non-contrast
Comparison: None.

CLINICAL DATA: TIA, headache, vision loss in right eye

EXAM:
CT HEAD WITHOUT CONTRAST
TECHNIQUE: Contiguous axial images were obtained from the base of the skull
through the vertex without intravenous contrast.

[Series 2: head w o · axial · 0.42mm/px · z∈[+1406,+1541]mm · 10 of 33 slices shown, 13 images]
[im 3/33  brain]
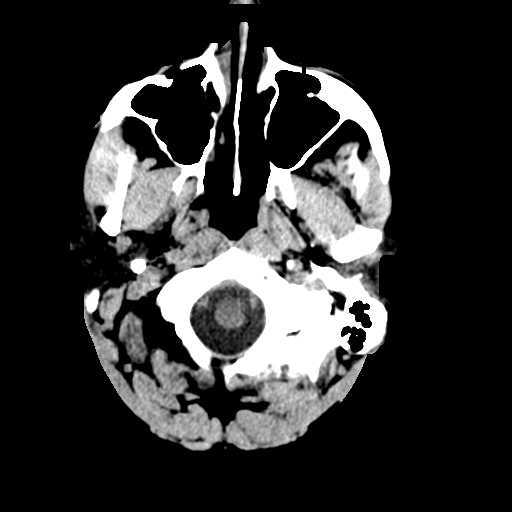
[im 3/33  bone]
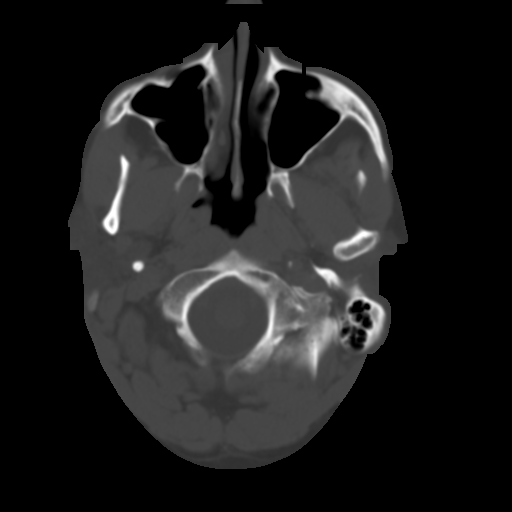
[im 6/33  brain]
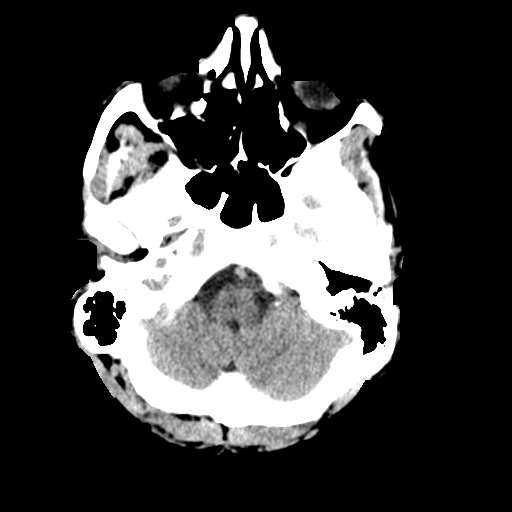
[im 9/33  brain]
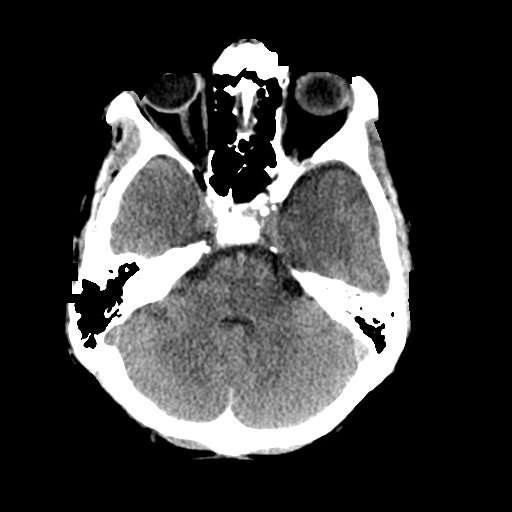
[im 12/33  brain]
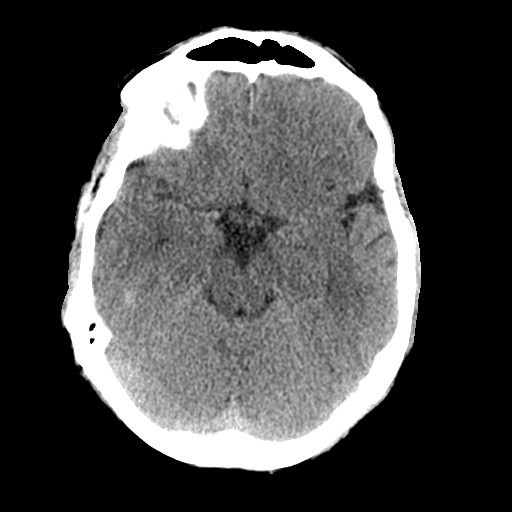
[im 15/33  brain]
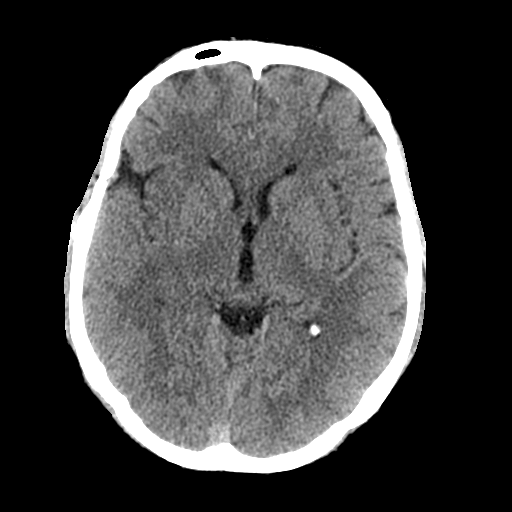
[im 15/33  bone]
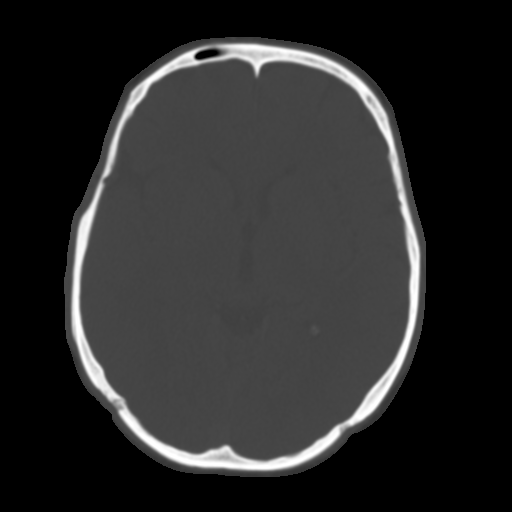
[im 18/33  brain]
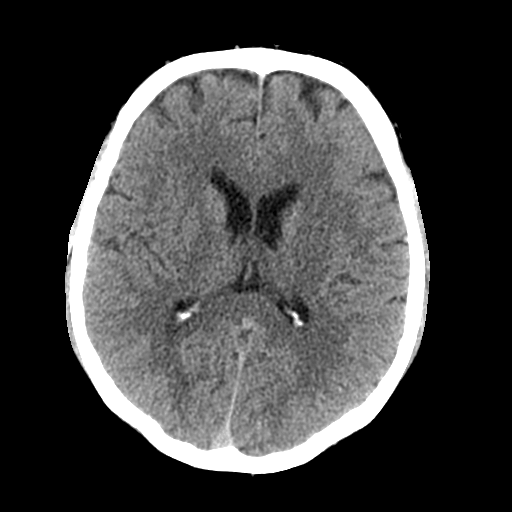
[im 21/33  brain]
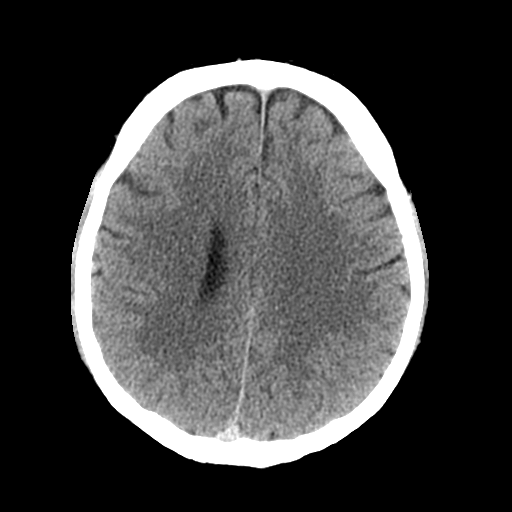
[im 25/33  brain]
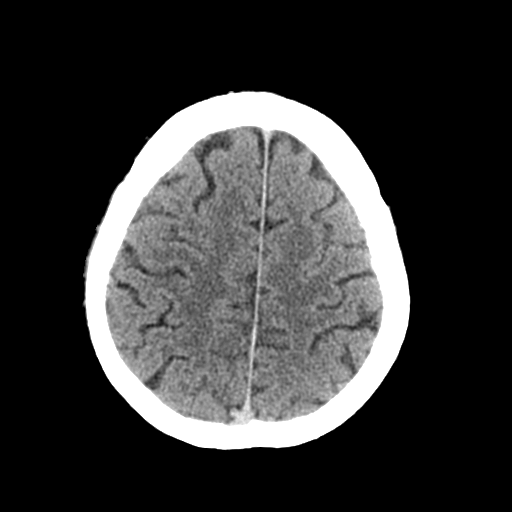
[im 27/33  brain]
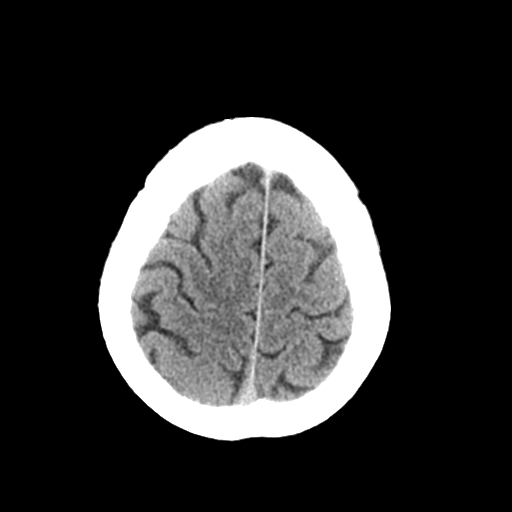
[im 27/33  bone]
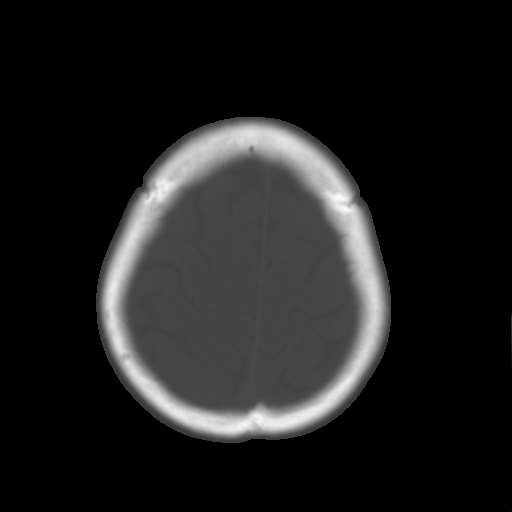
[im 30/33  brain]
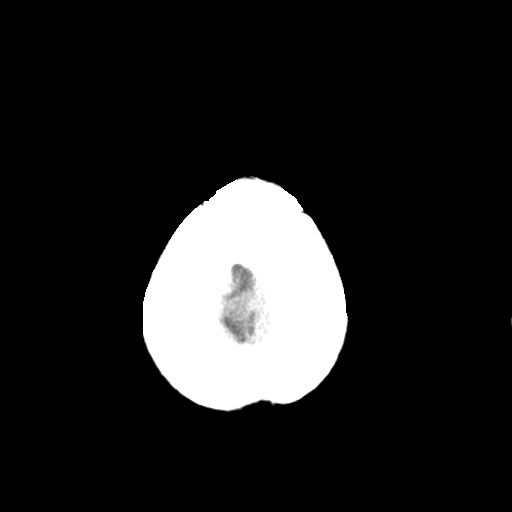

[Series 4: coronal soft · coronal · 0.34mm/px · 3 of 67 slices shown]
[im 23/67  brain]
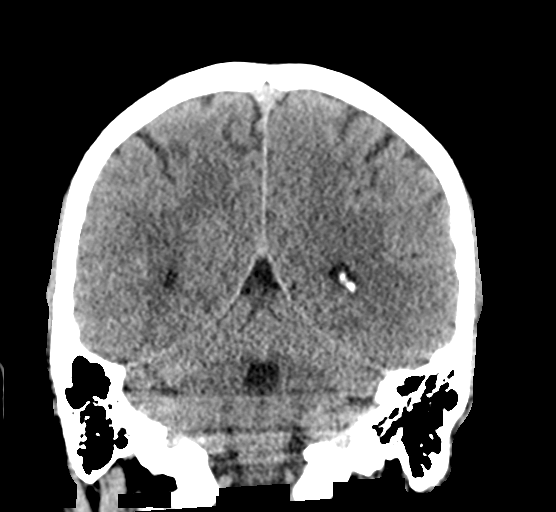
[im 30/67  brain]
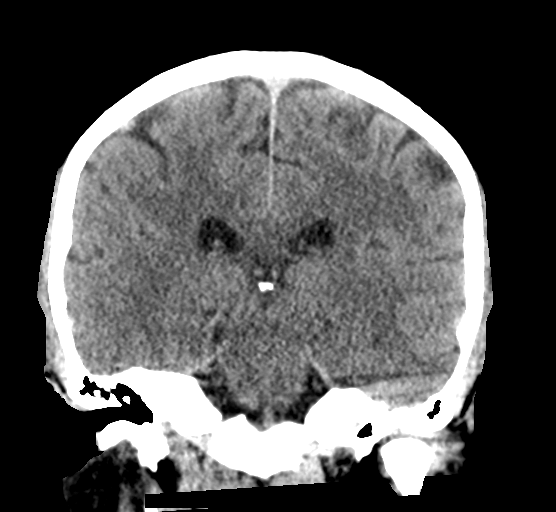
[im 37/67  brain]
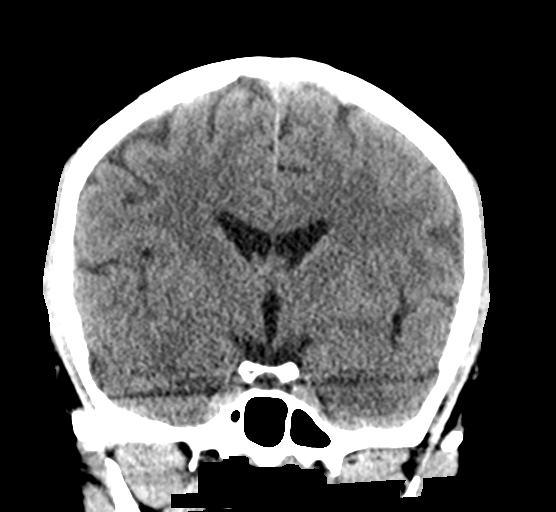

[Series 5: sagittal soft · sagittal · 0.32mm/px · 3 of 67 slices shown]
[im 23/67  brain]
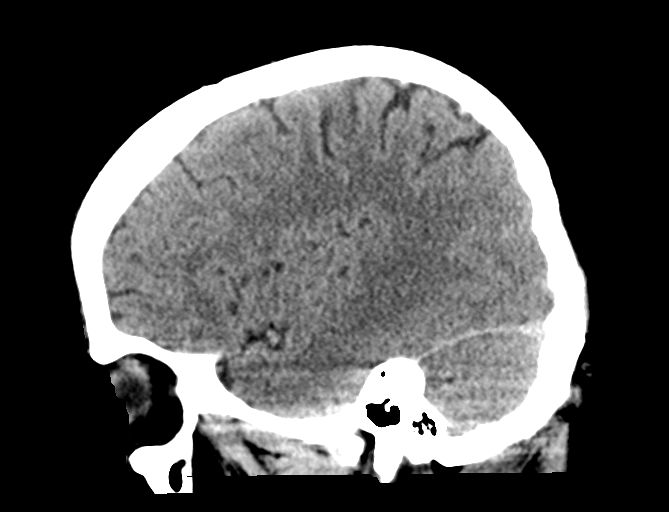
[im 34/67  brain]
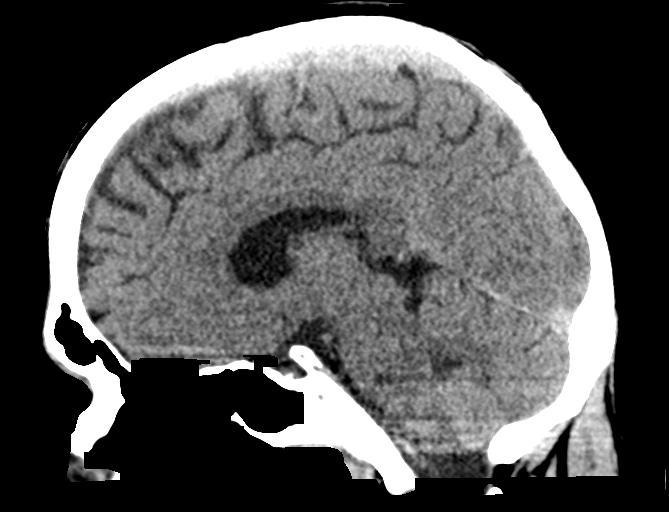
[im 45/67  brain]
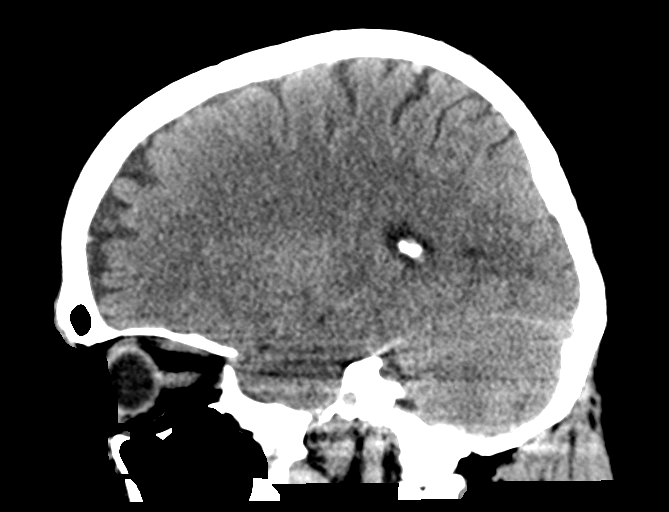

[16 of 47 positions shown; findings below may reference images not displayed]

FINDINGS: Brain: No acute intracranial abnormality. Specifically, no
hemorrhage, hydrocephalus, mass lesion, acute infarction, or
significant intracranial injury.

Vascular: No hyperdense vessel or unexpected calcification.

Skull: No acute calvarial abnormality.

Sinuses/Orbits: No acute findings

Other: None
IMPRESSION: No acute intracranial abnormality.

## 2020-08-28 MED ORDER — AMLODIPINE BESYLATE 5 MG PO TABS
5.0000 mg | ORAL_TABLET | Freq: Every day | ORAL | Status: DC
  Administered 2020-08-29: 5 mg via ORAL
  Filled 2020-08-28: qty 1

## 2020-08-28 MED ORDER — STROKE: EARLY STAGES OF RECOVERY BOOK
Freq: Once | Status: DC
  Filled 2020-08-28: qty 1

## 2020-08-28 MED ORDER — LORAZEPAM 0.5 MG PO TABS: 0.5000 mg | ORAL_TABLET | ORAL | Status: DC

## 2020-08-28 MED ORDER — SENNOSIDES-DOCUSATE SODIUM 8.6-50 MG PO TABS: 1.0000 | ORAL_TABLET | Freq: Every evening | ORAL | Status: DC | PRN

## 2020-08-28 MED ORDER — ACETAMINOPHEN 650 MG RE SUPP: 650.0000 mg | RECTAL | Status: DC | PRN

## 2020-08-28 MED ORDER — ACETAMINOPHEN 325 MG PO TABS: 650.0000 mg | ORAL_TABLET | ORAL | Status: DC | PRN

## 2020-08-28 MED ORDER — DIPHENHYDRAMINE HCL 50 MG/ML IJ SOLN
12.5000 mg | Freq: Once | INTRAMUSCULAR | Status: AC
  Administered 2020-08-28: 12.5 mg via INTRAVENOUS
  Filled 2020-08-28: qty 1

## 2020-08-28 MED ORDER — METOCLOPRAMIDE HCL 5 MG/ML IJ SOLN
10.0000 mg | Freq: Once | INTRAMUSCULAR | Status: AC
  Administered 2020-08-28: 10 mg via INTRAVENOUS
  Filled 2020-08-28: qty 2

## 2020-08-28 MED ORDER — RAMIPRIL 5 MG PO CAPS
5.0000 mg | ORAL_CAPSULE | Freq: Every day | ORAL | Status: DC
  Administered 2020-08-29: 5 mg via ORAL
  Filled 2020-08-28 (×3): qty 1

## 2020-08-28 MED ORDER — HEPARIN SODIUM (PORCINE) 5000 UNIT/ML IJ SOLN
5000.0000 [IU] | Freq: Three times a day (TID) | INTRAMUSCULAR | Status: DC
  Administered 2020-08-28 – 2020-08-29 (×2): 5000 [IU] via SUBCUTANEOUS
  Filled 2020-08-28 (×2): qty 1

## 2020-08-28 MED ORDER — ACETAMINOPHEN 160 MG/5ML PO SOLN: 650.0000 mg | ORAL | Status: DC | PRN

## 2020-08-28 MED ORDER — SODIUM CHLORIDE 0.9 % IV BOLUS
1000.0000 mL | Freq: Once | INTRAVENOUS | Status: AC
  Administered 2020-08-28: 1000 mL via INTRAVENOUS

## 2020-08-28 MED ORDER — LORAZEPAM 0.5 MG PO TABS
0.5000 mg | ORAL_TABLET | Freq: Two times a day (BID) | ORAL | Status: DC
  Administered 2020-08-29: 0.5 mg via ORAL
  Filled 2020-08-28: qty 1

## 2020-08-28 MED ORDER — HYDROCHLOROTHIAZIDE 12.5 MG PO CAPS
12.5000 mg | ORAL_CAPSULE | Freq: Every day | ORAL | Status: DC
  Administered 2020-08-29: 12.5 mg via ORAL
  Filled 2020-08-28: qty 1

## 2020-08-28 MED ORDER — ONDANSETRON HCL 4 MG/2ML IJ SOLN
4.0000 mg | Freq: Once | INTRAMUSCULAR | Status: AC
Start: 2020-08-28 — End: 2020-08-28
  Administered 2020-08-28: 4 mg via INTRAVENOUS
  Filled 2020-08-28: qty 2

## 2020-08-28 NOTE — Patient Instructions (Signed)
Based on what you shared with me, I feel your condition warrants further evaluation as soon as possible at an Emergency department.    NOTE: There will be NO CHARGE for this Visit   If you are having a true medical emergency please call 911.      Emergency Department-[facility]  Get Driving Directions  [phone]  [address]  [place], [place] [address]  Open 24/7/365      [facility] [address]  Get Driving Directions  [address]  [place], [place] [address]  Open 24/7/365    Emergency Department- [facility]  Get Driving Directions  [phone]  [address]  [place], [place] [address]  Open 24/7/365      [facility]  Get Driving Directions  [phone]  [address]  [place], [place] [address]  Open 24/7/365    [place]  Emergency Department- [facility]  Get Driving Directions  [phone]  [address]  [place], [place] [address]  Open 24/7/365    [place]  Emergency Department- [facility] [place]  Get Driving Directions  [address]  [place], [place] [address]  Open 24/7/365    [place]  Emergency Department- [facility]  Get Driving Directions  [phone]  [address]  [place], [place] [address]  Open 24/7/365    

## 2020-08-28 NOTE — ED Notes (Signed)
CBG 96. 

## 2020-08-28 NOTE — ED Provider Notes (Signed)
[facility] EMERGENCY DEPARTMENT Provider Note   CSN: [id number] Arrival date & time: 08/28/20  1854     History Chief Complaint  Patient presents with   Dizziness    Caleb Powell is a 50 y.o. male.  HPI  Patient with significant medical history of hyperlipidemia tobacco use, hypertension presents to the emergency department with chief complaint of visual loss.  Patient states around 1:32 PM today he had visual loss out of his right eye, states this lasted for approximately 50 seconds and resolve on its own, he states he then became extremely dizzy and lost his balance, this lasted for approximately 10 minutes and then resolve on its own.  He was unable to ambulate as he kept falling over.  He also states that he was having headache at that time, states he had a headache throughout the day.  Patient denies paresthesias or weakness in the upper/ lower extremities, he denies recent head trauma, is not on anticoagulant, he states he is never had this happen in the past, no history of CVAs or arrhythmias.  He does note that he has been having difficulty controlling his blood pressure, patient states he is on 2 blood pressure medications which not seem to be working.  Patient's wife and daughter were at bedside they noted that patient had became very off balance, fell almost multiple times, they had to help him to the car and into the hospital.  Stated never seen this past.  They do not endorse the patient difficulty with word finding, slurring of his words.   History reviewed. No pertinent past medical history.  Patient Active Problem List   Diagnosis Date Noted   TIA (transient ischemic attack) 08/28/2020   Hyperlipidemia 02/24/2018   Tobacco use disorder 02/24/2018   Mood disorder ([place]) 02/24/2018    History reviewed. No pertinent surgical history.     Family History  Problem Relation Age of Onset   Diabetes Mother    Heart disease Mother    Stroke Mother    Hypertension  Maternal Grandmother    Hypertension Maternal Grandfather    Heart disease Father     Social History   Tobacco Use   Smoking status: Every Day    Packs/day: 2.50    Years: 36.00    Pack years: 90.00    Types: Cigarettes   Smokeless tobacco: Former  Scientific laboratory technician Use: Former   Devices: used for about one month  Substance Use Topics   Alcohol use: No   Drug use: No    Home Medications Prior to Admission medications   Medication Sig Start Date End Date Taking? Authorizing Provider  hydrochlorothiazide (MICROZIDE) 12.5 MG capsule Take 12.5 mg by mouth daily. 08/13/20  Yes [provider]  LORazepam (ATIVAN) 0.5 MG tablet Take 0.5 mg by mouth See admin instructions. Take 1/2 in the morning and 1/2 every evening 08/13/20  Yes [provider]  omeprazole (PRILOSEC) 40 MG capsule Take 1 capsule (40 mg total) by mouth 2 (two) times daily. 04/17/20  Yes Soyla Dryer, PA-C  ramipril (ALTACE) 5 MG capsule Take 5 mg by mouth daily. 08/28/20  Yes [provider]  amLODipine (NORVASC) 5 MG tablet Take 1 tablet (5 mg total) by mouth daily. Patient not taking: No sig reported 04/17/20   Soyla Dryer, PA-C  fluvastatin (LESCOL) 20 MG capsule Take 1 capsule (20 mg total) by mouth at bedtime. Patient not taking: Reported on 08/28/2020 06/01/19   Soyla Dryer,  PA-C  LORazepam (ATIVAN PO) Take 1 tablet by mouth daily. Patient not taking: Reported on 08/28/2020    [provider]  lovastatin (MEVACOR) 10 MG tablet Take 10 mg by mouth daily. Patient not taking: Reported on 08/28/2020 08/28/20   [provider]    Allergies    Penicillins  Review of Systems   Review of Systems  Constitutional:  Negative for chills and fever.  HENT:  Negative for congestion.   Respiratory:  Negative for shortness of breath.   Cardiovascular:  Negative for chest pain.  Gastrointestinal:  Negative for abdominal pain.  Genitourinary:  Negative for enuresis.   Musculoskeletal:  Negative for back pain.  Skin:  Negative for rash.  Neurological:  Positive for dizziness and headaches.  Hematological:  Does not bruise/bleed easily.   Physical Exam Updated Vital Signs BP 133/76   Pulse 94   Temp 99.3 F (37.4 C)   Resp 17   Ht '5\' 9"'$  (1.753 m)   Wt 79.4 kg   SpO2 92%   BMI 25.85 kg/m   Physical Exam Vitals and nursing note reviewed.  Constitutional:      General: He is not in acute distress.    Appearance: He is not ill-appearing.  HENT:     Head: Normocephalic and atraumatic.     Nose: No congestion.  Eyes:     Extraocular Movements: Extraocular movements intact.     Conjunctiva/sclera: Conjunctivae normal.     Pupils: Pupils are equal, round, and reactive to light.  Cardiovascular:     Rate and Rhythm: Normal rate and regular rhythm.     Pulses: Normal pulses.     Heart sounds: No murmur heard.   No friction rub. No gallop.  Pulmonary:     Effort: No respiratory distress.     Breath sounds: No wheezing, rhonchi or rales.  Abdominal:     Palpations: Abdomen is soft.     Tenderness: There is no abdominal tenderness. There is no right CVA tenderness or left CVA tenderness.  Skin:    General: Skin is warm and dry.  Neurological:     Mental Status: He is alert.     GCS: GCS eye subscore is 4. GCS verbal subscore is 5. GCS motor subscore is 6.     Cranial Nerves: No cranial nerve deficit.     Sensory: Sensation is intact.     Motor: No weakness.     Coordination: Romberg sign negative. Finger-Nose-Finger Test normal.     Gait: Gait is intact.     Comments: Cranial nerves II through XII are grossly intact, patient noted word finding, no slurring of words, able to follow two-step commands, no unilateral weakness present.  Gait was intact.  Psychiatric:        Mood and Affect: Mood normal.    ED Results / Procedures / Treatments   Labs (all labs ordered are listed, but only abnormal results are displayed) Labs Reviewed  CBC  WITH DIFFERENTIAL/PLATELET - Abnormal; Notable for the following components:      Result Value   WBC 12.5 (*)    Neutro Abs 10.0 (*)    Monocytes Absolute 1.2 (*)    Abs Immature Granulocytes 0.09 (*)    All other components within normal limits  URINALYSIS, ROUTINE W REFLEX MICROSCOPIC - Abnormal; Notable for the following components:   Color, Urine STRAW (*)    Hgb urine dipstick SMALL (*)    All other components within normal limits  COMPREHENSIVE METABOLIC PANEL  PROTIME-INR  HIV ANTIBODY (ROUTINE TESTING W REFLEX)  HEMOGLOBIN A1C  LIPID PANEL  TROPONIN I (HIGH SENSITIVITY)  TROPONIN I (HIGH SENSITIVITY)    EKG None  Radiology CT HEAD WO CONTRAST (5MM)  Result Date: 08/28/2020 CLINICAL DATA:  TIA, headache, vision loss in right eye EXAM: CT HEAD WITHOUT CONTRAST TECHNIQUE: Contiguous axial images were obtained from the base of the skull through the vertex without intravenous contrast. COMPARISON:  None. FINDINGS: Brain: No acute intracranial abnormality. Specifically, no hemorrhage, hydrocephalus, mass lesion, acute infarction, or significant intracranial injury. Vascular: No hyperdense vessel or unexpected calcification. Skull: No acute calvarial abnormality. Sinuses/Orbits: No acute findings Other: None IMPRESSION: No acute intracranial abnormality. Electronically Signed   By: Rolm Baptise M.D.   On: 08/28/2020 19:32   DG Chest Port 1 View  Result Date: 08/28/2020 CLINICAL DATA:  Chest pain, dyspnea EXAM: PORTABLE CHEST 1 VIEW COMPARISON:  None. FINDINGS: Lungs are clear. No pneumothorax or pleural effusion. Cardiac size within normal limits. Pulmonary vascularity is normal. Healed left mid clavicular fracture noted. No acute bone abnormality IMPRESSION: No active disease. Electronically Signed   By: Fidela Salisbury M.D.   On: 08/28/2020 19:28    Procedures Procedures   Medications Ordered in ED Medications  amLODipine (NORVASC) tablet 5 mg (has no administration in time  range)  hydrochlorothiazide (MICROZIDE) capsule 12.5 mg (has no administration in time range)  ramipril (ALTACE) capsule 5 mg (has no administration in time range)   stroke: mapping our early stages of recovery book (has no administration in time range)  acetaminophen (TYLENOL) tablet 650 mg (has no administration in time range)    Or  acetaminophen (TYLENOL) 160 MG/5ML solution 650 mg (has no administration in time range)    Or  acetaminophen (TYLENOL) suppository 650 mg (has no administration in time range)  senna-docusate (Senokot-S) tablet 1 tablet (has no administration in time range)  heparin injection 5,000 Units (5,000 Units Subcutaneous Given 08/28/20 2321)  LORazepam (ATIVAN) tablet 0.5 mg (0.5 mg Oral Patient Refused/Not Given 08/28/20 2323)  metoCLOPramide (REGLAN) injection 10 mg (10 mg Intravenous Given 08/28/20 2234)  sodium chloride 0.9 % bolus 1,000 mL (0 mLs Intravenous Stopped 08/28/20 2322)  diphenhydrAMINE (BENADRYL) injection 12.5 mg (12.5 mg Intravenous Given 08/28/20 2234)  ondansetron (ZOFRAN) injection 4 mg (4 mg Intravenous Given 08/28/20 2233)    ED Course  I have reviewed the triage vital signs and the nursing notes.  Pertinent labs & imaging results that were available during my care of the patient were reviewed by me and considered in my medical decision making (see chart for details).    MDM Rules/Calculators/A&P                          Initial impression-patient presents with visual changes feeling off balance which is since resolved he is alert, does not appear in distress, vitals are normal for hypertension.  I am concerned for possible TIA, will obtain basic lab work-up, obtain CT head consult neurology for further evaluation.  Work-up-CBC shows slight leukocytosis of 12.5, CMP unremarkable, UA unremarkable, negative delta troponin, INR unremarkable CT head were unremarkable  Reassessment-patient has a headache, will provide with migraine cocktail  continue to monitor.  Informed that teleneurology can take up to 24 hours since this is not an acute stroke.  Will recommend admission for TIA work-up.  Patient is agreement with this plan.  Will consult with hospitalist for admission.  Consult-spoke with Dr. Nori Riis who will admit the patient.  Rule out- low suspicion for CVA or intracranial head bleed as patient  no neuro deficits noted on exam, CT head did not reveal any acute findings.  Low suspicion for dissection and/or aneurysm of the cranial vessels as presentation atypical of etiology, patient has low risk factors.  Low suspicion for ACS or arrhythmias as patient had negative delta troponins, EKG without signs of ischemia.  Low suspicion for systemic infection and/or meningitis as there is no meningeal sign present as patient is nontoxic-appearing, vital signs reassuring, no obvious source infection noted on exam.     Plan-admit patient for TIA work-up follow-up with neurology recommendations.  Final Clinical Impression(s) / ED Diagnoses Final diagnoses:  TIA (transient ischemic attack)    Rx / DC Orders ED Discharge Orders     None        Marcello Fennel, PA-C [id number]    Lianne Cure, [place] [phone]

## 2020-08-28 NOTE — Progress Notes (Signed)
Virtual Visit Consent   Caleb Powell, you are scheduled for a virtual visit with a [facility] provider today.     Just as with appointments in the office, your consent must be obtained to participate.  Your consent will be active for this visit and any virtual visit you may have with one of our providers in the next 365 days.     If you have a MyChart account, a copy of this consent can be sent to you electronically.  All virtual visits are billed to your insurance company just like a traditional visit in the office.    As this is a virtual visit, video technology does not allow for your provider to perform a traditional examination.  This may limit your provider's ability to fully assess your condition.  If your provider identifies any concerns that need to be evaluated in person or the need to arrange testing (such as labs, EKG, etc.), we will make arrangements to do so.     Although advances in technology are sophisticated, we cannot ensure that it will always work on either your end or our end.  If the connection with a video visit is poor, the visit may have to be switched to a telephone visit.  With either a video or telephone visit, we are not always able to ensure that we have a secure connection.     I need to obtain your verbal consent now.   Are you willing to proceed with your visit today?    Caleb Powell has provided verbal consent on 08/28/2020 for a virtual visit video.   Montine Circle, PA-C   Date: 08/28/2020 5:37 PM   Virtual Visit via Video Note   I, Montine Circle, connected with  Caleb Powell  ([id number], 21-May-1970) on 08/28/20 at  5:30 PM EDT by a video-enabled telemedicine application and verified that I am speaking with the correct person using two identifiers.  Location: Patient: Virtual Visit Location Patient: Home Provider: Virtual Visit Location Provider: Home Office   I discussed the limitations of evaluation and management by telemedicine and the  availability of in person appointments. The patient expressed understanding and agreed to proceed.    History of Present Illness: Caleb Powell is a 50 y.o. who identifies as a male who was assigned male at birth, and is being seen today for high BP.  States that he thinks he had an "eye stroke."  He states that he lost vision in his eye completely for 55 seconds.  He reports frontal headache.  States pressures have been in the 200s.  HPI: HPI  Problems:  Patient Active Problem List   Diagnosis Date Noted   Hyperlipidemia 02/24/2018   Tobacco use disorder 02/24/2018   Mood disorder ([place]) 02/24/2018    Allergies:  Allergies  Allergen Reactions   Penicillins     Any "cillins"  Unknown allergy reaction.   Medications:  Current Outpatient Medications:    amLODipine (NORVASC) 5 MG tablet, Take 1 tablet (5 mg total) by mouth daily., Disp: 30 tablet, Rfl: 3   fluvastatin (LESCOL) 20 MG capsule, Take 1 capsule (20 mg total) by mouth at bedtime., Disp: 30 capsule, Rfl: 4   LORazepam (ATIVAN PO), Take 1 tablet by mouth daily., Disp: , Rfl:    omeprazole (PRILOSEC) 40 MG capsule, Take 1 capsule (40 mg total) by mouth 2 (two) times daily., Disp: 60 capsule, Rfl: 3  Observations/Objective: Patient is well-developed, well-nourished in no acute distress.  Resting comfortably at home.  Head is normocephalic, atraumatic.  No labored breathing.  Speech is clear and coherent with logical content.  Patient is alert and oriented at baseline.    Assessment and Plan: 1. Vision loss Redirect to emergency department.  Patient instructed to dial 911 due to vision loss and high BP.  Follow Up Instructions: I discussed the assessment and treatment plan with the patient. The patient was provided an opportunity to ask questions and all were answered. The patient agreed with the plan and demonstrated an understanding of the instructions.  A copy of instructions were sent to the patient via MyChart.  The  patient was advised to call back or seek an in-person evaluation if the symptoms worsen or if the condition fails to improve as anticipated.  Time:  I spent 10 minutes with the patient via telehealth technology discussing the above problems/concerns.    Montine Circle, PA-C

## 2020-08-29 ENCOUNTER — Observation Stay (HOSPITAL_COMMUNITY): Payer: Medicaid Other

## 2020-08-29 ENCOUNTER — Observation Stay (HOSPITAL_BASED_OUTPATIENT_CLINIC_OR_DEPARTMENT_OTHER): Payer: Medicaid Other

## 2020-08-29 DIAGNOSIS — D72829 Elevated white blood cell count, unspecified: Secondary | ICD-10-CM | POA: Diagnosis not present

## 2020-08-29 DIAGNOSIS — R079 Chest pain, unspecified: Secondary | ICD-10-CM | POA: Diagnosis not present

## 2020-08-29 DIAGNOSIS — G459 Transient cerebral ischemic attack, unspecified: Secondary | ICD-10-CM

## 2020-08-29 DIAGNOSIS — I169 Hypertensive crisis, unspecified: Secondary | ICD-10-CM | POA: Diagnosis not present

## 2020-08-29 DIAGNOSIS — Z720 Tobacco use: Secondary | ICD-10-CM

## 2020-08-29 LAB — RESP PANEL BY RT-PCR (FLU A&B, COVID) ARPGX2
Influenza A by PCR: NEGATIVE
Influenza B by PCR: NEGATIVE
SARS Coronavirus 2 by RT PCR: POSITIVE — AB

## 2020-08-29 LAB — ECHOCARDIOGRAM COMPLETE
AR max vel: 2.05 cm2
AV Area VTI: 2.41 cm2
AV Area mean vel: 2.06 cm2
AV Mean grad: 4 mmHg
AV Peak grad: 8.9 mmHg
Ao pk vel: 1.49 m/s
Area-P 1/2: 3.79 cm2
Height: 69 in
MV VTI: 2.51 cm2
S' Lateral: 3.35 cm
Weight: 2800.72 oz

## 2020-08-29 LAB — LIPID PANEL
Cholesterol: 177 mg/dL (ref 0–200)
HDL: 26 mg/dL — ABNORMAL LOW (ref >40)
LDL Cholesterol: 125 mg/dL — ABNORMAL HIGH (ref 0–99)
Total CHOL/HDL Ratio: 6.8 RATIO
Triglycerides: 131 mg/dL (ref <150)
VLDL: 26 mg/dL (ref 0–40)

## 2020-08-29 LAB — TSH: TSH: 0.563 u[IU]/mL (ref 0.350–4.500)

## 2020-08-29 LAB — RAPID URINE DRUG SCREEN, HOSP PERFORMED
Amphetamines: NOT DETECTED
Barbiturates: NOT DETECTED
Benzodiazepines: NOT DETECTED
Cocaine: NOT DETECTED
Opiates: NOT DETECTED
Tetrahydrocannabinol: NOT DETECTED

## 2020-08-29 LAB — GLUCOSE, CAPILLARY: Glucose-Capillary: 131 mg/dL — ABNORMAL HIGH (ref 70–99)

## 2020-08-29 LAB — HEMOGLOBIN A1C
Hgb A1c MFr Bld: 5.6 % (ref 4.8–5.6)
Mean Plasma Glucose: 114.02 mg/dL

## 2020-08-29 LAB — HIV ANTIBODY (ROUTINE TESTING W REFLEX): HIV Screen 4th Generation wRfx: NONREACTIVE

## 2020-08-29 IMAGING — US US RENAL
1 series · 14 of 25 positions shown · non-contrast
Comparison: None.

CLINICAL DATA: Hypertensive crisis

EXAM:
RENAL / URINARY TRACT ULTRASOUND COMPLETE

[Series 1: us renal · 14 of 34 slices shown]
[im 1/34]
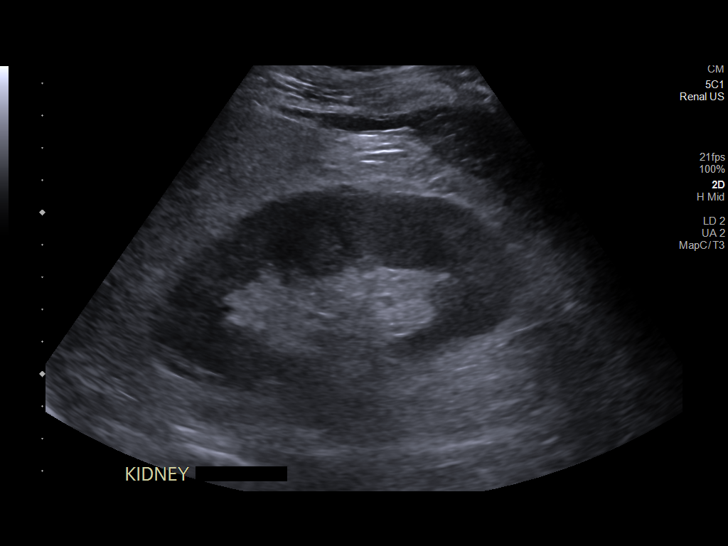
[im 3/34]
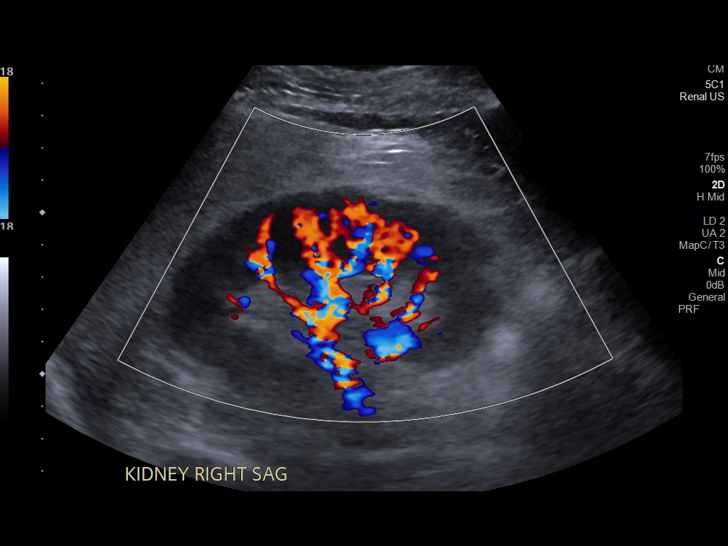
[im 6/34]
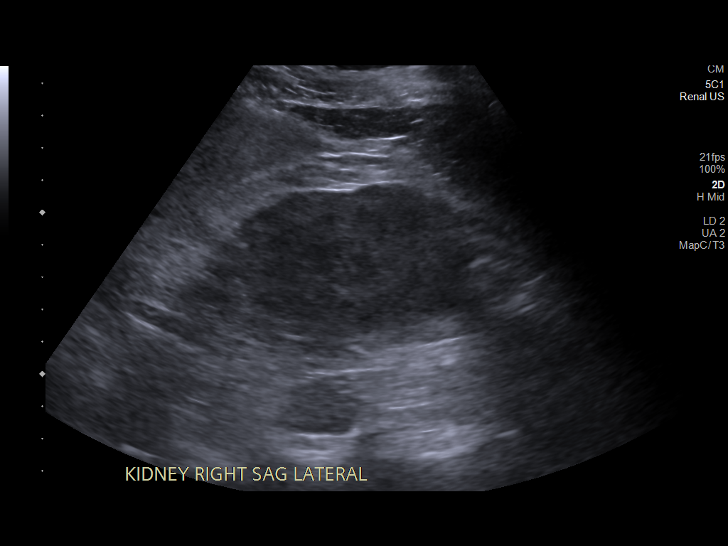
[im 9/34]
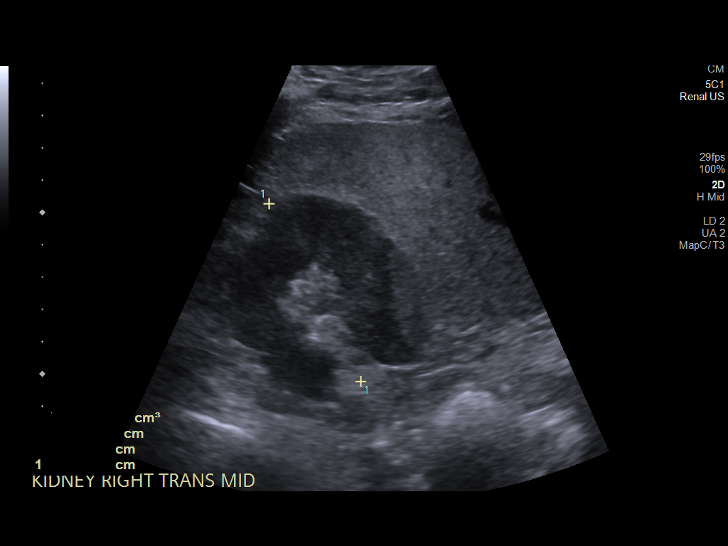
[im 12/34]
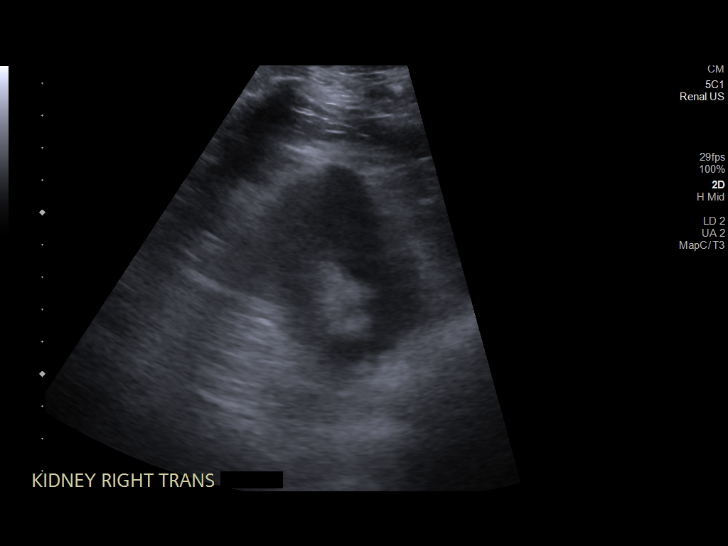
[im 13/34]
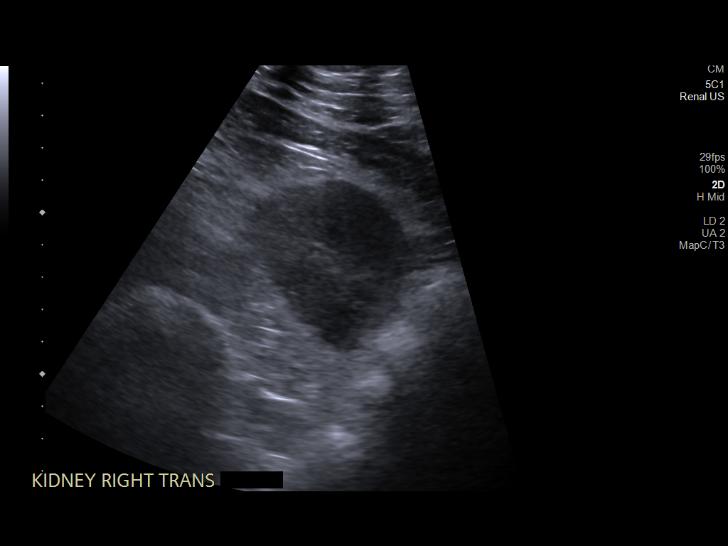
[im 16/34]
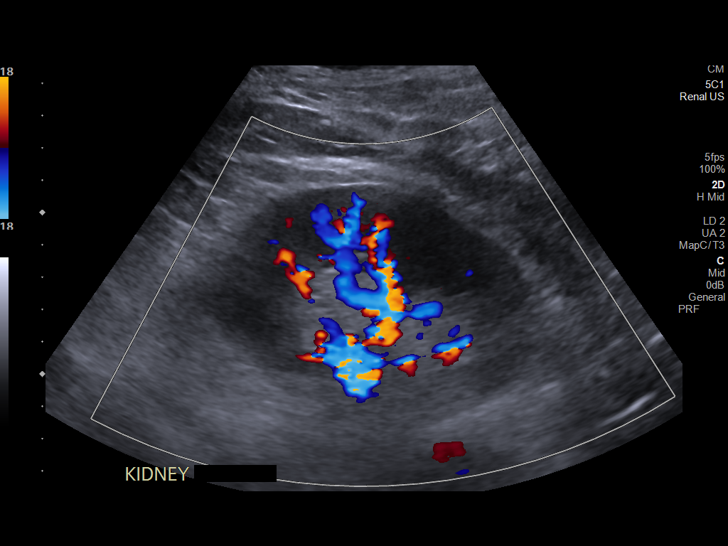
[im 18/34]
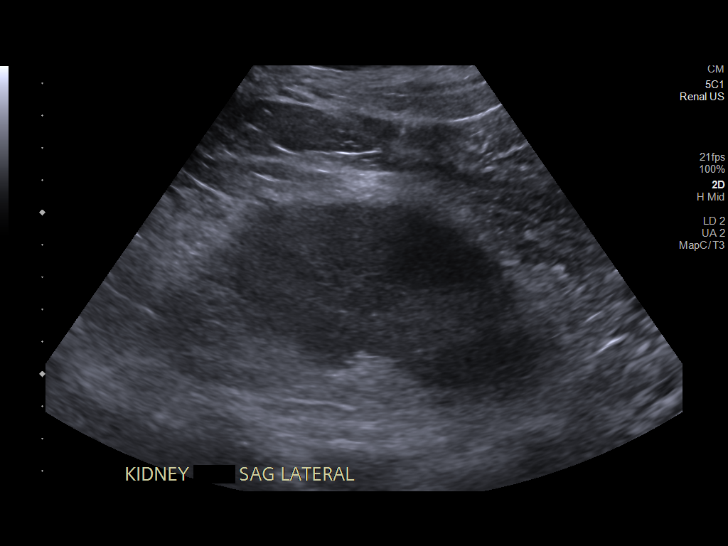
[im 21/34]
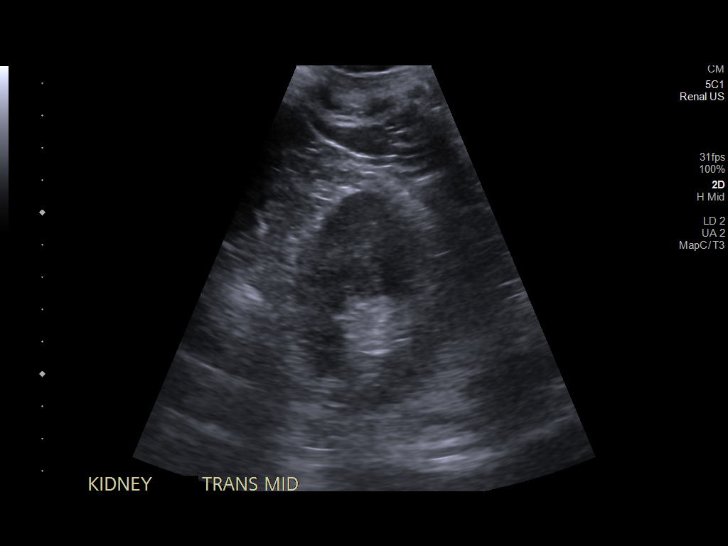
[im 23/34]
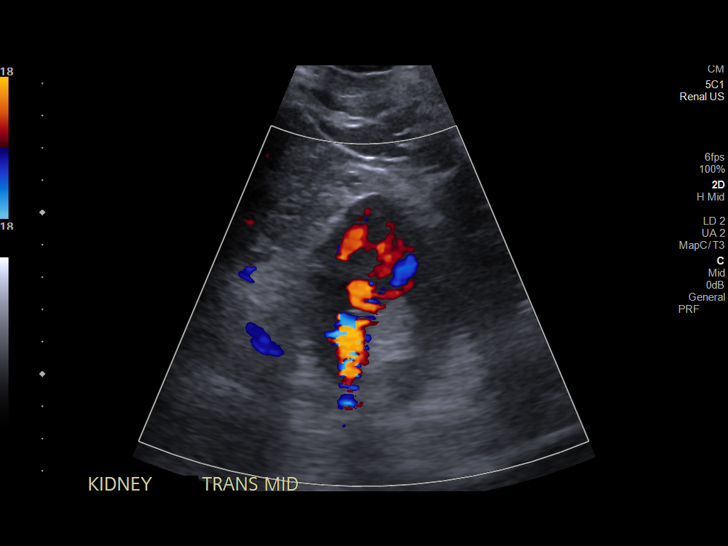
[im 25/34]
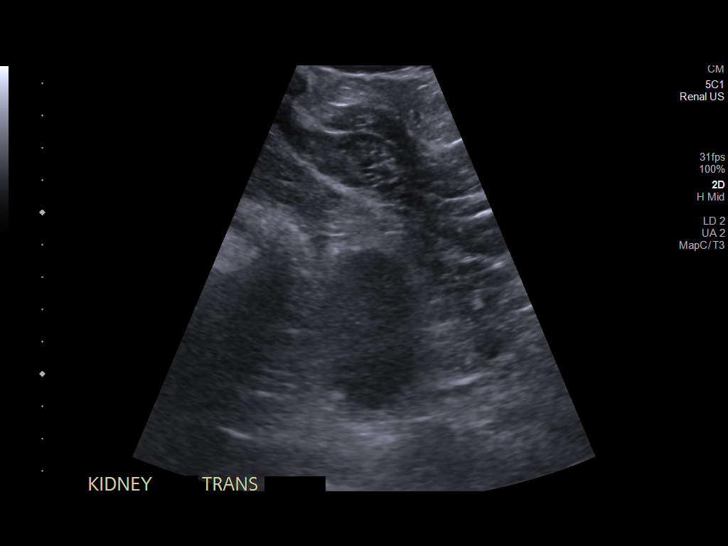
[im 28/34]
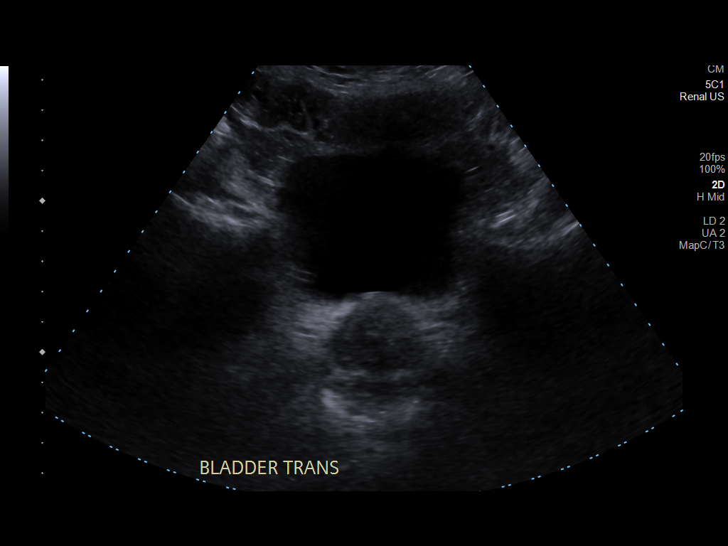
[im 31/34]
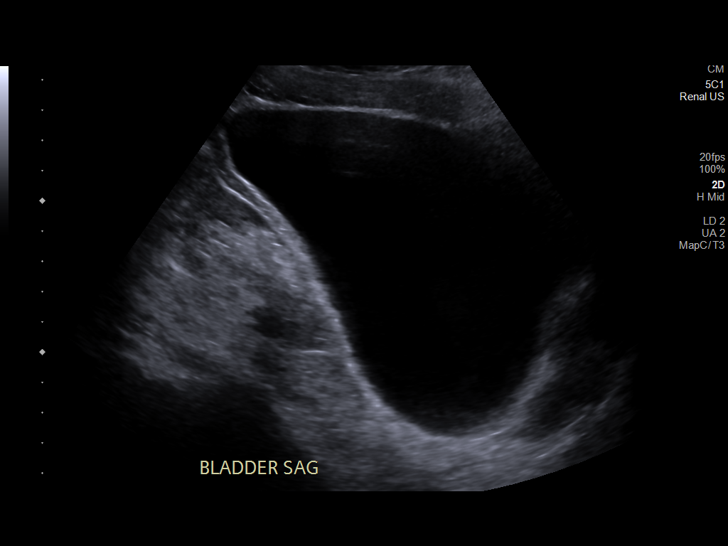
[im 34/34]
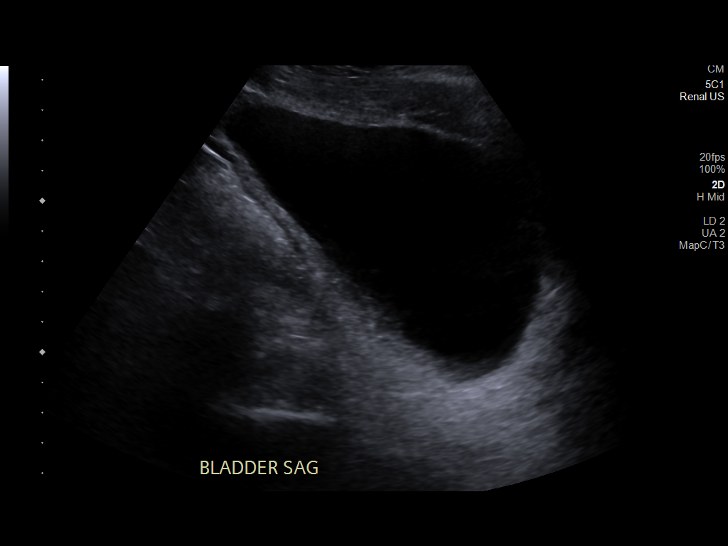

[14 of 25 positions shown; findings below may reference images not displayed]

FINDINGS: Right Kidney:

Renal measurements: 11.3 cm x 6.0 cm x 6.2 cm = volume: 218 mL.
Echogenicity within normal limits. No mass or hydronephrosis
visualized.

Left Kidney:

Renal measurements: 11.7 cm x 6.5 cm x 5.4 cm = volume: 213 mL.
Echogenicity within normal limits. No mass or hydronephrosis
visualized.

Bladder:

Appears normal for degree of bladder distention.

Other:

None.
IMPRESSION: Normal renal ultrasound.

## 2020-08-29 IMAGING — MR MR HEAD W/O CM
9 of 10 series · 39 of 48 positions shown · IV contrast (gadavist)
Comparison: CT head [DATE].

CLINICAL DATA: Neuro deficit, acute, stroke suspected.

EXAM:
MRI HEAD WITHOUT CONTRAST
MRA HEAD WITHOUT CONTRAST
MRA NECK WITHOUT AND WITH CONTRAST
TECHNIQUE: Multiplanar, multi-echo pulse sequences of the brain and surrounding
structures were acquired without intravenous contrast. Angiographic
images of the Circle of Willis were acquired using MRA technique
without intravenous contrast. Angiographic images of the neck were
acquired using MRA technique without and with intravenous contrast.
Carotid stenosis measurements (when applicable) are obtained
utilizing NASCET criteria, using the distal internal carotid
diameter as the denominator.
CONTRAST:  7mL GADAVIST GADOBUTROL 1 MMOL/ML IV SOLN

[Series 5: DWI · axial · 4.0mm · 0.88mm/px · z∈[-91,+44]mm · 5 of 36 slices shown (1 of 4)]
[im 1/36]
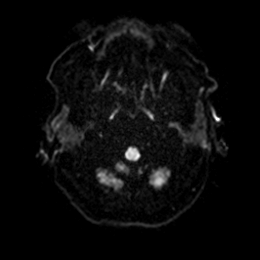
[im 9/36]
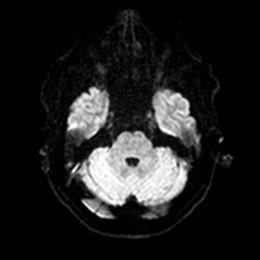
[im 18/36]
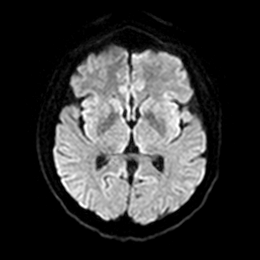
[im 27/36]
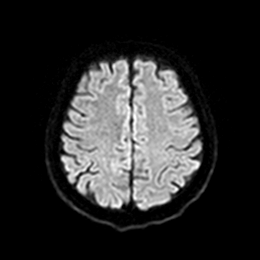
[im 36/36]
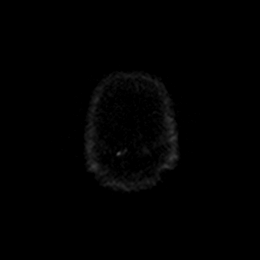

[Series 6: DWI · axial · 4.0mm · 0.88mm/px · z∈[-91,+44]mm · 5 of 36 slices shown (2 of 4)]
[im 1/36]
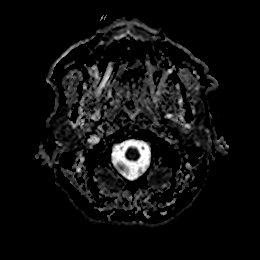
[im 9/36]
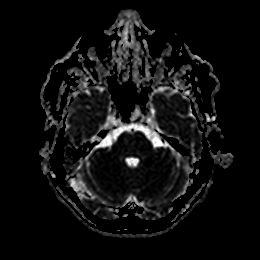
[im 18/36]
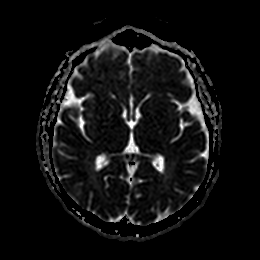
[im 27/36]
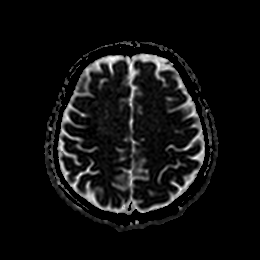
[im 36/36]
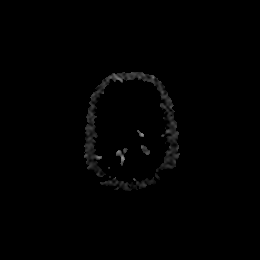

[Series 7: DWI · coronal · 4.0mm · 0.88mm/px · 5 of 32 slices shown (3 of 4)]
[im 1/32]
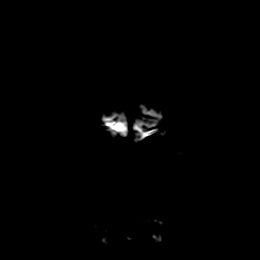
[im 8/32]
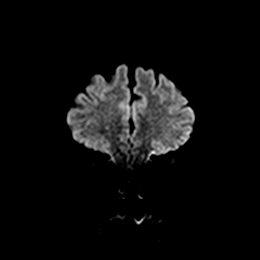
[im 16/32]
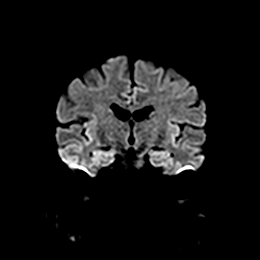
[im 24/32]
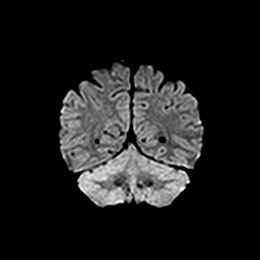
[im 32/32]
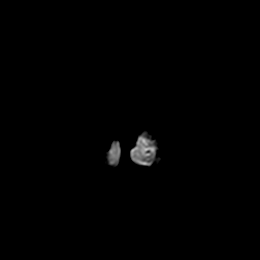

[Series 8: DWI · coronal · 4.0mm · 0.88mm/px · 5 of 32 slices shown (4 of 4)]
[im 1/32]
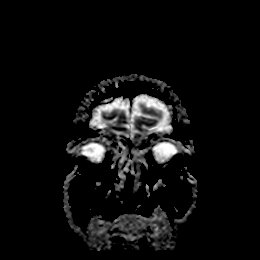
[im 8/32]
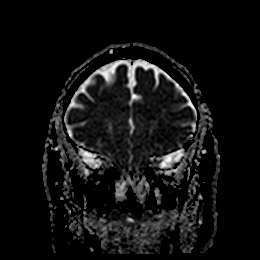
[im 16/32]
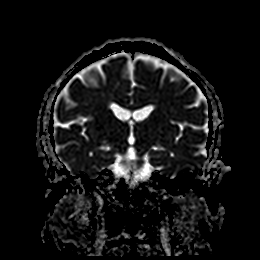
[im 24/32]
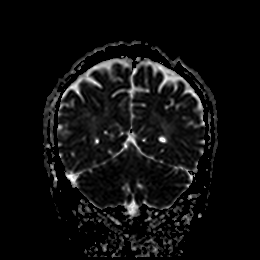
[im 32/32]
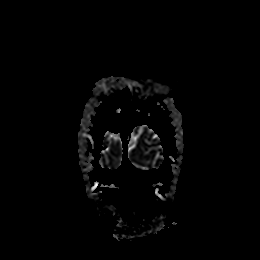

[Series 9: T1 · sagittal · 5.0mm · 0.80mm/px · 3 of 23 slices shown]
[im 1/23]
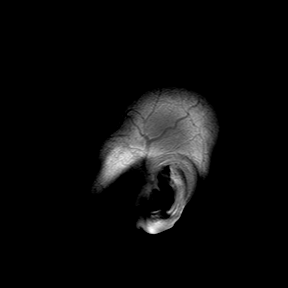
[im 12/23]
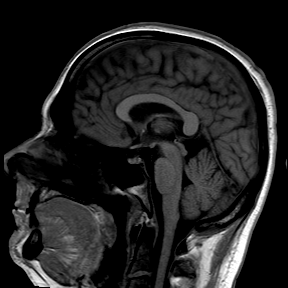
[im 23/23]
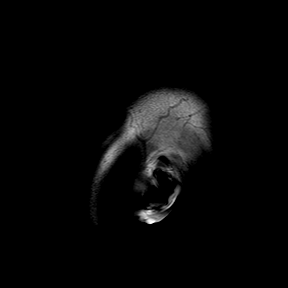

[Series 10: T2 · axial · 5.0mm · 0.72mm/px · z∈[-98,+50]mm · 3 of 23 slices shown (1 of 2)]
[im 1/23]
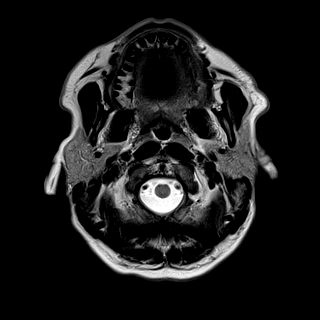
[im 12/23]
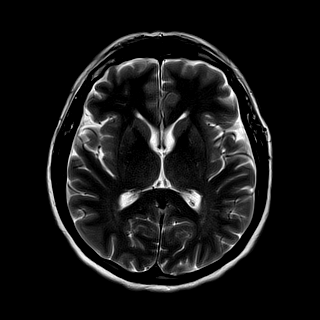
[im 23/23]
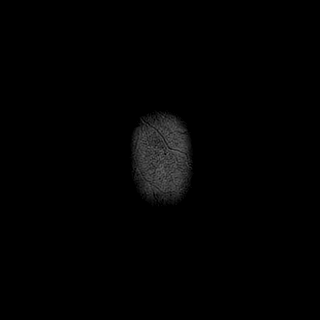

[Series 11: ax hemo · axial · 5.0mm · 0.86mm/px · z∈[-92,+47]mm · 4 of 25 slices shown]
[im 1/25]
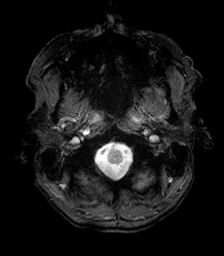
[im 9/25]
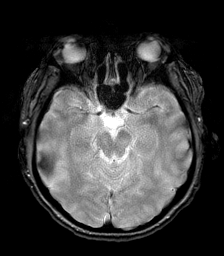
[im 17/25]
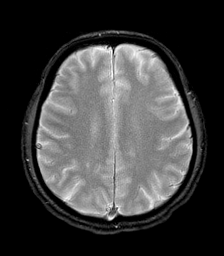
[im 25/25]
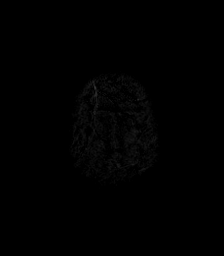

[Series 12: FLAIR · axial · 4.0mm · 0.43mm/px · z∈[-94,+49]mm · 5 of 38 slices shown]
[im 1/38]
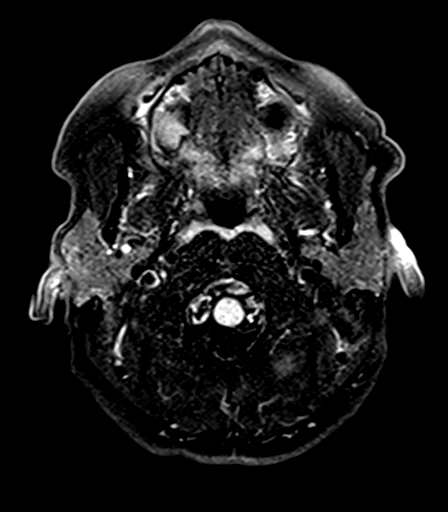
[im 10/38]
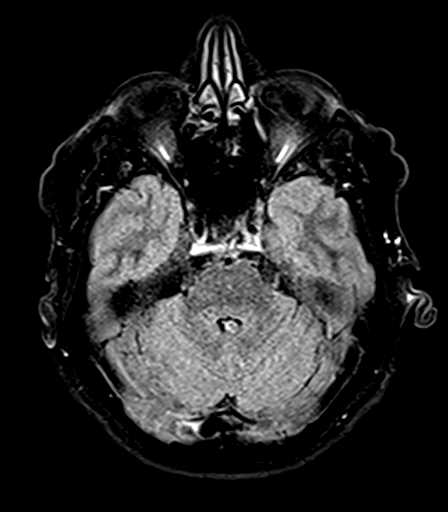
[im 19/38]
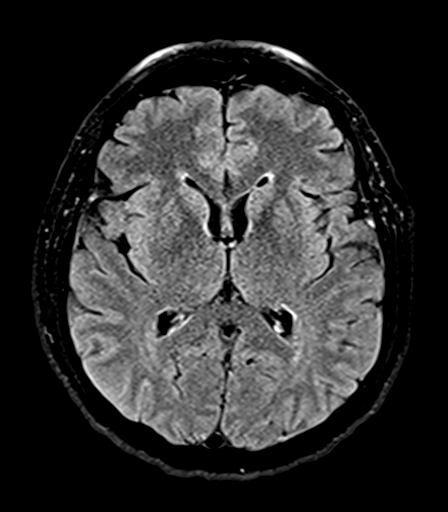
[im 28/38]
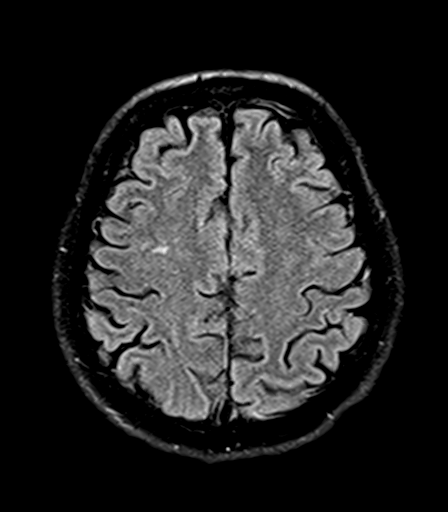
[im 38/38]
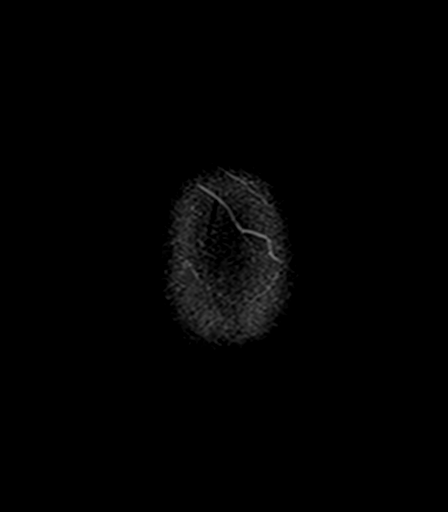

[Series 14: T2 · coronal · 5.0mm · 0.72mm/px · 4 of 28 slices shown (2 of 2)]
[im 1/28]
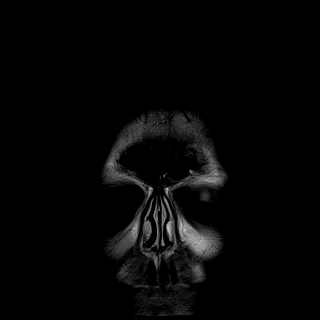
[im 10/28]
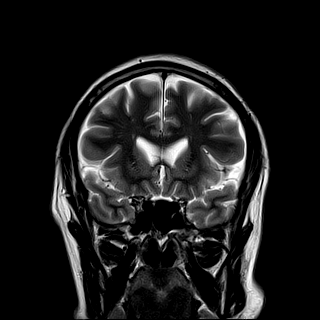
[im 19/28]
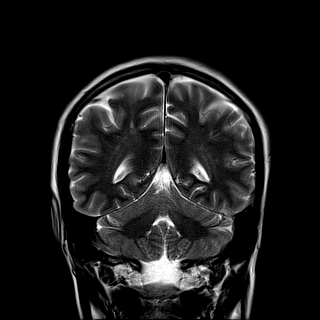
[im 28/28]
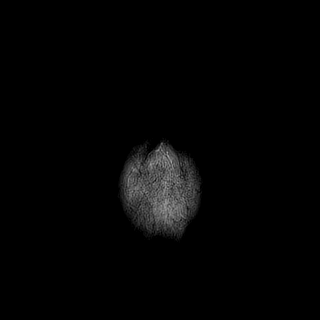

[39 of 48 positions shown; findings below may reference images not displayed]

FINDINGS: MRI HEAD FINDINGS

Brain: No acute infarction, hemorrhage, hydrocephalus, extra-axial
collection or mass lesion. No pathologic intracranial enhancement.
Small remote cortical infarct in the right frontal lobe (series 10,
image 18) and small remote infarcts in the left cerebellum (series
10, image 4). Additional mild scattered T2 hyperintensities in the
white matter, nonspecific but most likely related to chronic
microvascular ischemic disease.

Vascular: See below.

Skull and upper cervical spine: Diffuse T1 hypointensity of the
visualized cervical bone marrow.

Sinuses/Orbits: Mild paranasal sinus mucosal thickening with
retention cysts and the left sphenoid sinus and inferior right
maxillary sinus. No acute orbital findings.

Other: No mastoid effusions.

MRA HEAD FINDINGS

Anterior circulation: Bilateral intracranial ICAs are patent with
mild bilateral paraclinoid ICA narrowing. Hypoplastic or absent left
A1 ACA with widely patent prominent/dominant right A1 ACA, likely
congenital anatomic variant. Bilateral A2 ACAs are patent. Bilateral
M1 MCAs are patent without hemodynamically significant stenosis. No
proximal M2 branch occlusion. Small 2 mm superiorly directed conical
outpatching with vessel arising from the tip involving the right M1
MCA, compatible with infundibulum.

Posterior circulation: Non dominant/small left vertebral artery with
superimposed moderate intradural stenosis. Bilateral intradural
vertebral arteries are patent. The basilar artery and bilateral
posterior cerebral arteries are patent without proximal
hemodynamically significant stenosis.

Anatomic variants: Detailed above.

MRA NECK FINDINGS

Aortic arch: Great vessel origins are patent.

Right carotid system: Moderate narrowing at the carotid bifurcation
with severe short-segment stenosis of the proximal ICA (for example
see series [XT], image 144; series [XT], image 6). The more distal
ICA remains opacified.

Left carotid system: Patent. Mild carotid bifurcation and ICA
narrowing.

Vertebral arteries: Right dominant. Moderate stenosis of the right
vertebral artery origin with multifocal mild narrowing of the right
vertebral artery. Multifocal severe stenosis of the left vertebral
artery with suspected occlusion of the mid V2 segment with distal V2
reconstitution.
IMPRESSION: MRI head:

1. No evidence of acute intracranial abnormality.
2. Small remote cortical infarct in the right frontal lobe and small
remote infarcts in the left cerebellum.
3. Diffuse T1 hypointensity of the visualized cervical bone marrow.
This finding is nonspecific but can be seen with chronic anemia,
chronic hypoxia (such as in smokers), obesity, or less commonly a
lymphoproliferative disorder.

MRA neck:

1. Severe short-segment proximal right ICA stenosis.
2. Multifocal severe stenosis of the left vertebral artery with
suspected occlusion of the mid left V2 segment with distal V2
reconstitution.
3. Moderate right vertebral artery origin stenosis.

MRA head:

1. No large vessel occlusion.
2. Small left vertebral artery with superimposed moderate intradural
stenosis.

## 2020-08-29 IMAGING — MR MR MRA NECK WO/W CM
4 series · 39 of 48 positions shown · IV contrast (gadavist)
Comparison: CT head [DATE].

CLINICAL DATA: Neuro deficit, acute, stroke suspected.

EXAM:
MRI HEAD WITHOUT CONTRAST
MRA HEAD WITHOUT CONTRAST
MRA NECK WITHOUT AND WITH CONTRAST
TECHNIQUE: Multiplanar, multi-echo pulse sequences of the brain and surrounding
structures were acquired without intravenous contrast. Angiographic
images of the Circle of Willis were acquired using MRA technique
without intravenous contrast. Angiographic images of the neck were
acquired using MRA technique without and with intravenous contrast.
Carotid stenosis measurements (when applicable) are obtained
utilizing NASCET criteria, using the distal internal carotid
diameter as the denominator.
CONTRAST:  7mL GADAVIST GADOBUTROL 1 MMOL/ML IV SOLN

[Series 12: tof_fl3d_tra_iso · axial · 0.6mm · 0.52mm/px · z∈[-167,-94]mm · 12 of 133 slices shown]
[im 1/133]
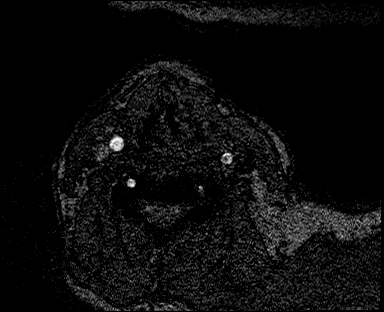
[im 9/133]
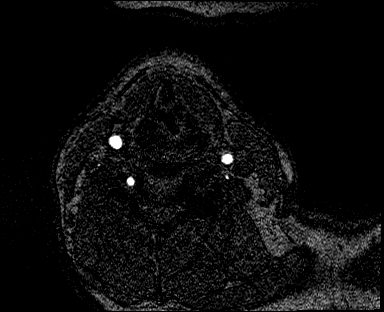
[im 17/133]
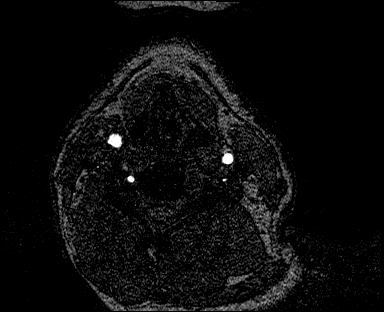
[im 25/133]
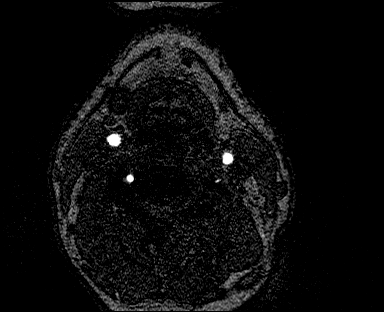
[im 42/133]
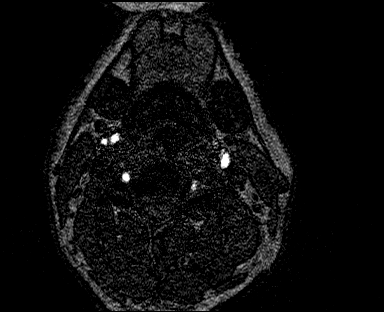
[im 58/133]
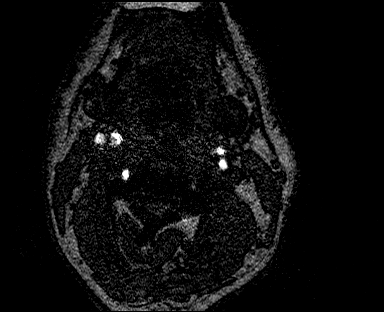
[im 67/133]
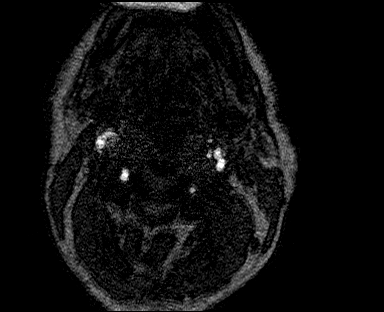
[im 75/133]
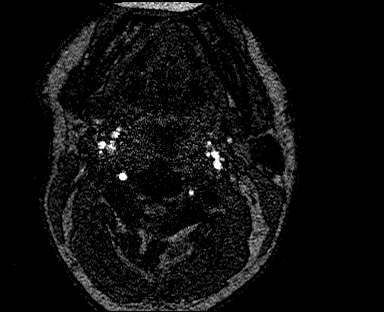
[im 91/133]
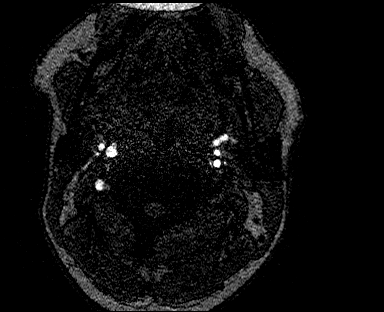
[im 108/133]
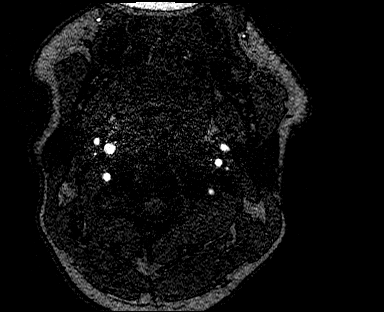
[im 116/133]
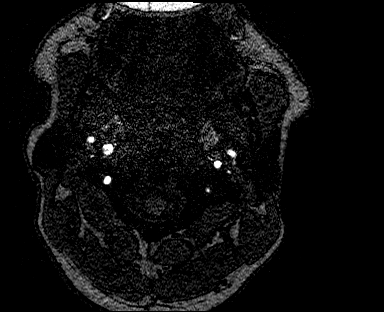
[im 124/133]
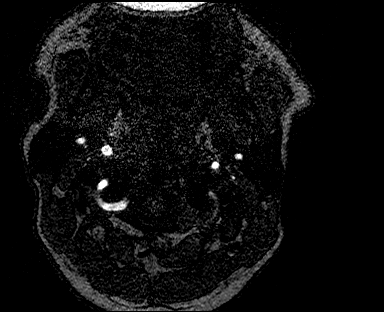

[Series 15: angio_fl3d_cor_pre_ttc=3.0s · coronal · 0.9mm · 0.85mm/px · 11 of 88 slices shown]
[im 1/88]
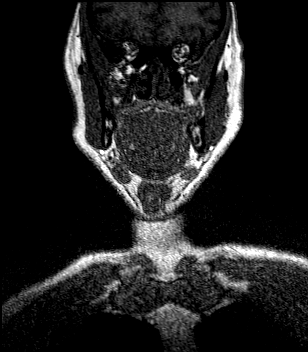
[im 9/88]
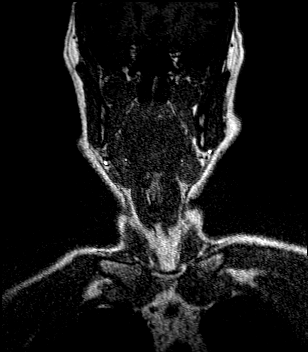
[im 18/88]
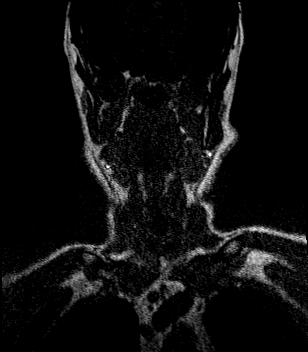
[im 27/88]
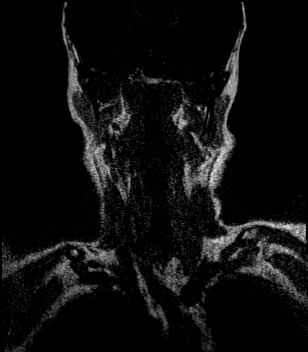
[im 35/88]
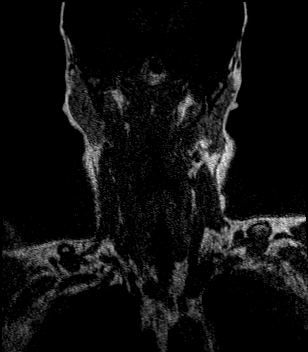
[im 44/88]
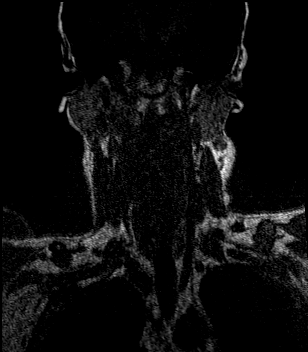
[im 53/88]
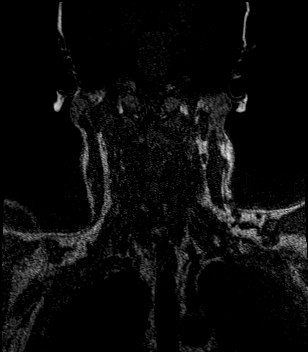
[im 61/88]
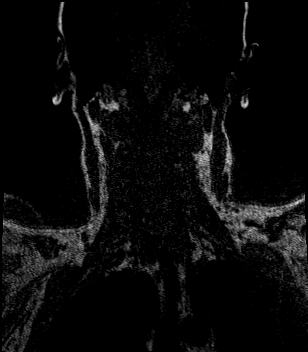
[im 70/88]
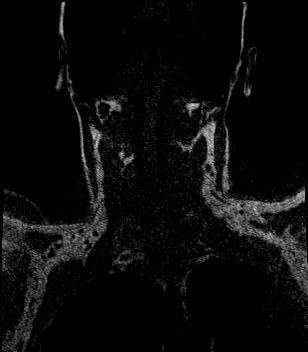
[im 79/88]
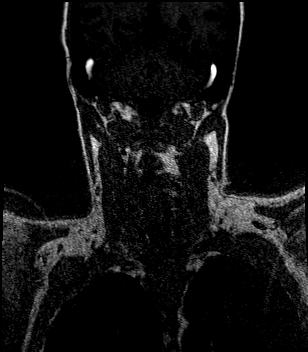
[im 88/88]
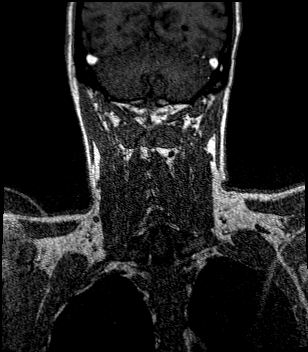

[Series 17: angio_fl3d_cor_post_ttc=3.0s · coronal · 0.9mm · 0.85mm/px · 8 of 84 slices shown]
[im 1/84]
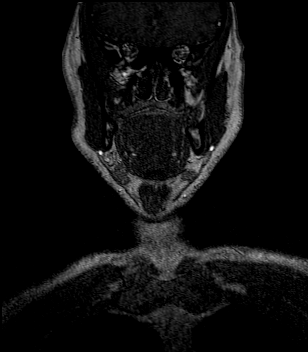
[im 10/84]
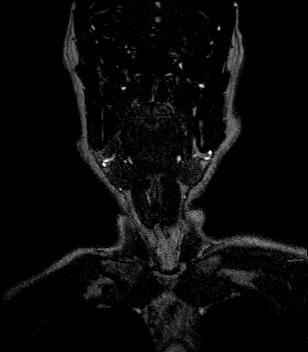
[im 28/84]
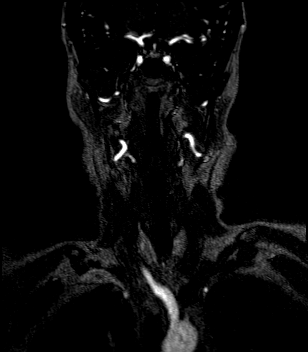
[im 37/84]
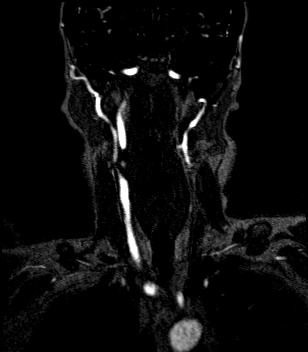
[im 47/84]
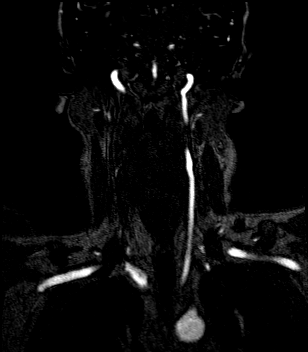
[im 56/84]
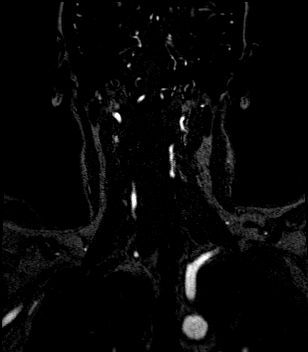
[im 74/84]
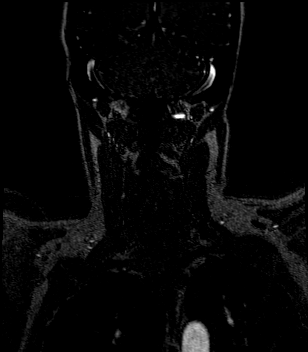
[im 84/84]
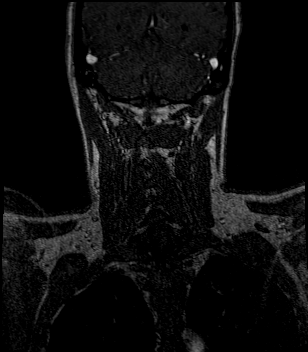

[Series 19: angio_fl3d_cor_post_ttc=3.0s_moco-adv_sub · coronal · 0.9mm · 0.85mm/px · 8 of 82 slices shown]
[im 1/82]
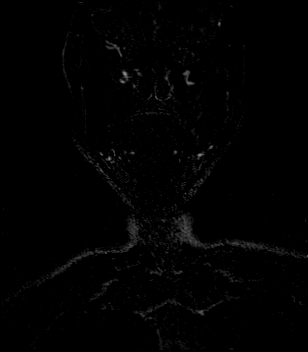
[im 10/82]
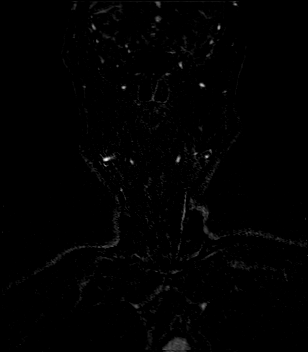
[im 28/82]
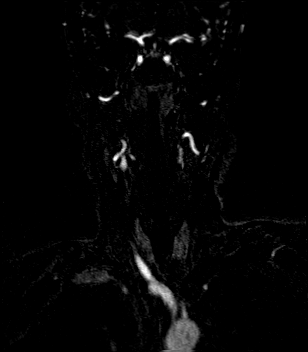
[im 37/82]
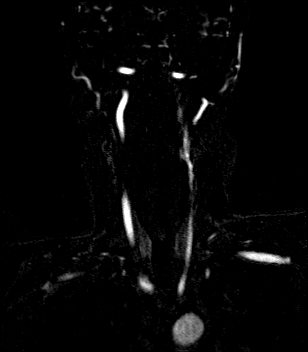
[im 46/82]
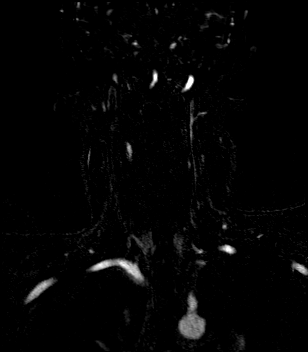
[im 55/82]
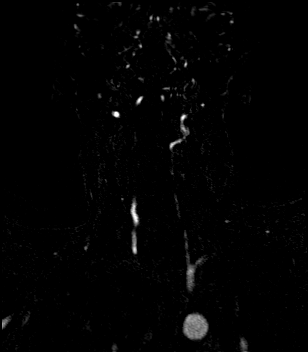
[im 73/82]
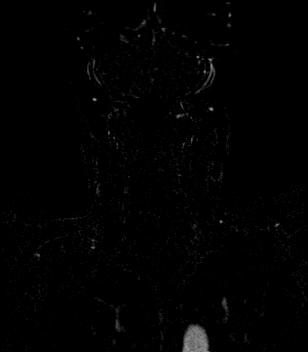
[im 82/82]
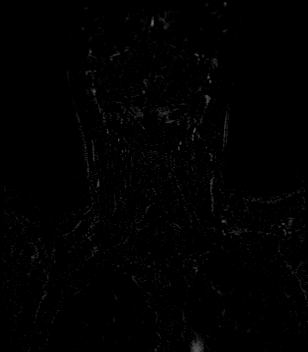

[39 of 48 positions shown; findings below may reference images not displayed]

FINDINGS: MRI HEAD FINDINGS

Brain: No acute infarction, hemorrhage, hydrocephalus, extra-axial
collection or mass lesion. No pathologic intracranial enhancement.
Small remote cortical infarct in the right frontal lobe (series 10,
image 18) and small remote infarcts in the left cerebellum (series
10, image 4). Additional mild scattered T2 hyperintensities in the
white matter, nonspecific but most likely related to chronic
microvascular ischemic disease.

Vascular: See below.

Skull and upper cervical spine: Diffuse T1 hypointensity of the
visualized cervical bone marrow.

Sinuses/Orbits: Mild paranasal sinus mucosal thickening with
retention cysts and the left sphenoid sinus and inferior right
maxillary sinus. No acute orbital findings.

Other: No mastoid effusions.

MRA HEAD FINDINGS

Anterior circulation: Bilateral intracranial ICAs are patent with
mild bilateral paraclinoid ICA narrowing. Hypoplastic or absent left
A1 ACA with widely patent prominent/dominant right A1 ACA, likely
congenital anatomic variant. Bilateral A2 ACAs are patent. Bilateral
M1 MCAs are patent without hemodynamically significant stenosis. No
proximal M2 branch occlusion. Small 2 mm superiorly directed conical
outpatching with vessel arising from the tip involving the right M1
MCA, compatible with infundibulum.

Posterior circulation: Non dominant/small left vertebral artery with
superimposed moderate intradural stenosis. Bilateral intradural
vertebral arteries are patent. The basilar artery and bilateral
posterior cerebral arteries are patent without proximal
hemodynamically significant stenosis.

Anatomic variants: Detailed above.

MRA NECK FINDINGS

Aortic arch: Great vessel origins are patent.

Right carotid system: Moderate narrowing at the carotid bifurcation
with severe short-segment stenosis of the proximal ICA (for example
see series [XT], image 144; series [XT], image 6). The more distal
ICA remains opacified.

Left carotid system: Patent. Mild carotid bifurcation and ICA
narrowing.

Vertebral arteries: Right dominant. Moderate stenosis of the right
vertebral artery origin with multifocal mild narrowing of the right
vertebral artery. Multifocal severe stenosis of the left vertebral
artery with suspected occlusion of the mid V2 segment with distal V2
reconstitution.
IMPRESSION: MRI head:

1. No evidence of acute intracranial abnormality.
2. Small remote cortical infarct in the right frontal lobe and small
remote infarcts in the left cerebellum.
3. Diffuse T1 hypointensity of the visualized cervical bone marrow.
This finding is nonspecific but can be seen with chronic anemia,
chronic hypoxia (such as in smokers), obesity, or less commonly a
lymphoproliferative disorder.

MRA neck:

1. Severe short-segment proximal right ICA stenosis.
2. Multifocal severe stenosis of the left vertebral artery with
suspected occlusion of the mid left V2 segment with distal V2
reconstitution.
3. Moderate right vertebral artery origin stenosis.

MRA head:

1. No large vessel occlusion.
2. Small left vertebral artery with superimposed moderate intradural
stenosis.

## 2020-08-29 MED ORDER — HYDRALAZINE HCL 20 MG/ML IJ SOLN: 10.0000 mg | Freq: Four times a day (QID) | INTRAMUSCULAR | Status: DC | PRN

## 2020-08-29 MED ORDER — GADOBUTROL 1 MMOL/ML IV SOLN
7.0000 mL | Freq: Once | INTRAVENOUS | Status: AC | PRN
  Administered 2020-08-29: 7 mL via INTRAVENOUS

## 2020-08-29 MED ORDER — NICOTINE 21 MG/24HR TD PT24
21.0000 mg | MEDICATED_PATCH | Freq: Every day | TRANSDERMAL | Status: DC
  Administered 2020-08-29: 21 mg via TRANSDERMAL
  Filled 2020-08-29: qty 1

## 2020-08-29 MED ORDER — ASPIRIN 325 MG PO TABS
ORAL_TABLET | ORAL | Status: AC
  Filled 2020-08-29: qty 1

## 2020-08-29 MED ORDER — SUMATRIPTAN SUCCINATE 50 MG PO TABS
50.0000 mg | ORAL_TABLET | ORAL | Status: DC | PRN
  Filled 2020-08-29: qty 1

## 2020-08-29 MED ORDER — ASPIRIN 325 MG PO TABS
325.0000 mg | ORAL_TABLET | Freq: Every day | ORAL | Status: DC
  Administered 2020-08-29: 325 mg via ORAL
  Filled 2020-08-29: qty 1

## 2020-08-29 MED ORDER — SODIUM CHLORIDE 0.9 % IV SOLN: INTRAVENOUS | Status: DC

## 2020-08-29 MED ORDER — ALBUTEROL SULFATE (2.5 MG/3ML) 0.083% IN NEBU: 2.5000 mg | INHALATION_SOLUTION | Freq: Four times a day (QID) | RESPIRATORY_TRACT | Status: DC | PRN

## 2020-08-29 NOTE — Evaluation (Addendum)
Occupational Therapy Evaluation Patient Details Name: Caleb Powell MRN: [id number] DOB: 05/04/1970 Today's Date: 08/29/2020    History of Present Illness Caleb Powell  is a 50 y.o. male, with medical history on file but known hypertension and work-up reported for thyroid disease, and nasopharyngeal scope for neck cancer- both negative per patient report. He had these workups done 2/2 throat pain. Patient does not smoke 2-1/2 packs a day for 30 years.  He presents the ED today with a chief complaint of vision loss in his right eye.  He reports that the last 1 lasted for 55 seconds.  He was speaking incoherently for 5 minutes afterwards.  He did have dizziness.  He also had a headache, that he has been having for several days.  Patient reports that his blood pressure was also very high.  Patient reports that his blood pressure at home is routinely over 200.  He does take hydrochlorothiazide and ramipril.  He refuses his Norvasc because somebody he knew had side effects from it.  Patient reports no weakness on one side more than the other.  Patient reports he had no trouble swallowing or walking.  No focal deficits on exam and my exam.  He was given Reglan and Benadryl for headache in the ED.  Telemetry neuro was consulted and recommended starting him on full dose aspirin.  They report that is difficult to discern between TIA or complicated migraine at this point.  They also agree the patient is not having any focal deficits at this time and only has a headache that is less intense than his arrival.  Patient reports an associated decrease in appetite.  He reports he is lost 13 pounds in 5 days.  He denies any fever or cough.  Patient has no other complaints at this time.   Clinical Impression   Pt supine in bed upon start of session. Pt demonstrates ability to independently sit up and stand from bed. Pt able to ambulate in hall independently. Pt reports that R eye vision has returned. No deficits noted during  visual assessment. Pt appears to be at or near baseline function. Pt is not recommended for further OT in the hospital setting and will be discharged to care of nursing staff for the remainder of his stay.     Follow Up Recommendations  No OT follow up    Equipment Recommendations  None recommended by OT      08/29/20 0900  Pain Assessment  Pain Assessment 0-10  Pain Score 2  Pain Location throat  Pain Descriptors / Indicators  (chronic, aggrivating)           Precautions / Restrictions Precautions Precautions: None Restrictions Weight Bearing Restrictions: No      Mobility Bed Mobility Overal bed mobility: Independent                  Transfers Overall transfer level: Independent               General transfer comment: Able to stand from bed, walk in hall, and return to bed with independence.    Balance Overall balance assessment: Independent                                         ADL either performed or assessed with clinical judgement   ADL Overall ADL's : Independent  General ADL Comments: Able to ambulate in hall and return to bed without difficulty.     Vision Baseline Vision/History: 1 Wears glasses Ability to See in Adequate Light: 0 Adequate Patient Visual Report: Other (comment) (Pt lost vision in R eye for 55 seconds but vision has been normal since.) Vision Assessment?: No apparent visual deficits                Pertinent Vitals/Pain Pain Assessment: No/denies pain          Extremity/Trunk Assessment Upper Extremity Assessment Upper Extremity Assessment: Overall WFL for tasks assessed   Lower Extremity Assessment Lower Extremity Assessment: Defer to PT evaluation   Cervical / Trunk Assessment Cervical / Trunk Assessment: Normal   Communication Communication Communication: No difficulties   Cognition Arousal/Alertness: Awake/alert Behavior  During Therapy: WFL for tasks assessed/performed Overall Cognitive Status: Within Functional Limits for tasks assessed                                                      Home Living Family/patient expects to be discharged to:: Private residence Living Arrangements: Children Available Help at Discharge: Family;Available 24 hours/day Type of Home: House Home Access: Stairs to enter [employer] of Steps: 3 Entrance Stairs-Rails: Right Home Layout: One level     Bathroom Shower/Tub: Teacher, early years/pre: Standard     Home Equipment: Bedside commode;Shower seat          Prior Functioning/Environment Level of Independence: Independent                         OT Goals(Current goals can be found in the care plan section) Acute Rehab OT Goals Patient Stated Goal: return home                   Co-evaluation PT/OT/SLP Co-Evaluation/Treatment: Yes Reason for Co-Treatment: To address functional/ADL transfers   OT goals addressed during session: ADL's and self-care;Strengthening/ROM      AM-PAC OT "6 Clicks" Daily Activity     Outcome Measure Help from another person eating meals?: None Help from another person taking care of personal grooming?: None Help from another person toileting, which includes using toliet, bedpan, or urinal?: None Help from another person bathing (including washing, rinsing, drying)?: None Help from another person to put on and taking off regular upper body clothing?: None Help from another person to put on and taking off regular lower body clothing?: None 6 Click Score: 24   End of Session    Activity Tolerance: Patient tolerated treatment well Patient left: in bed;with call bell/phone within reach  OT Visit Diagnosis: Low vision, both eyes (H54.2);Other (comment) (R eye vision loss)                Time: [id number] OT Time Calculation (min): 8 min Charges:  OT General Charges $OT  Visit: 1 Visit OT Evaluation $OT Eval Low Complexity: [place] OT, MOT  Larey Seat 08/29/2020, 9:21 AM

## 2020-08-29 NOTE — Consult Note (Signed)
[facility] Tele[facility]urology Consult Services  Stat Consult  Date of Service:   08/28/2020 20:07:05  Diagnosis:       G45.9 - Transient cerebral ischemic attack, unspecified       H53.8 - Blurred Vision  Impression the patient had transient right eye vision loss that has since resolved. He had no other weakness numbness or tingling which could be suggestive of amaurosis, migrainous phenomenon, hypertensive emergency given the spikes in blood pressure and headaches. He does not have any fever or neck stiffness. He will need stroke workup and inpatient neurology consultation. MRI of the brain without contrast, fasting lipid panel, hemoglobin A1c, either MRA head and neck or CTA head and neck. Would start full strength aspirin. Encourage tobacco use cessation.  CT HEAD: Showed No Acute Hemorrhage or Acute Core Infarct Reviewed  Our recommendations are outlined below.  Diagnostic Studies: Recommend MRI brain without contrast Routine CTA head and neck visualization of vasculature Transthoracic Echo with bubble study, if available  Laboratory Studies: Recommend Lipid panel Hemoglobin A1c  Medication: Initiate Aspirin 325 mg daily Statins for LDL goal less than 70  Nursing Recommendations: Telemetry, IV Fluids, avoid dextrose containing fluids, Maintain euglycemia Neuro checks q4 hrs x 24 hrs and then per shift Head of bed 30 degrees  Consultations: Recommend Speech therapy if failed dysphagia screen Physical therapy/Occupational therapy  DVT Prophylaxis: Choice of Primary Team  Disposition: Neurology will follow   Metrics: [facility] Notification Time: 08/28/2020 20:05:27 Stamp Time: 08/28/2020 20:07:05 Callback Response Time: 08/28/2020 20:14:57   ----------------------------------------------------------------------------------------------------  Chief Complaint: transient right eye vision loss, headache  History of Present  Illness: Patient is a 50 year old Male.   The patient is a 50 year old man with a history of HTN, HLD, tobacco use. he states that he used to smoke 2-1/2 packs a day for many years and now is down to 1-1/2 packs a day. He states his blood pressure has been labile, difficult to control. He said it spiking to [id number] systolic and then it'll stay that way for several hours and then come back down again to the 160s or so. Yesterday evening he had transient right eye vision change were he said everything became very cloudy he couldn't see at all out of his right eye. He did not have any vision changes in the left eye. He denies any other weakness numbness or tingling. In addition, he's had a headache the past couple of days. No fevers or chills. CT of the head did not show any acute changes. the vision change only lasted about a minute and then resolved. Other than the headache he now feels back to neurologic baseline. He states he typically does not get headaches, no n/v.   Past Medical History:      Hypertension      Hyperlipidemia      There is NO history of Atrial Fibrillation      There is NO history of Coronary Artery Disease      There is NO history of Stroke  Anticoagulant use:  No  Antiplatelet use: No   Examination: BP(133/76), Pulse(71), Blood Glucose(94) 1A: Level of Consciousness - Alert; keenly responsive + 0 1B: Ask Month and Age - Both Questions Right + 0 1C: Blink Eyes & Squeeze Hands - Performs Both Tasks + 0 2: Test Horizontal Extraocular Movements - Normal + 0 3: Test Visual Fields - No Visual Loss + 0 4: Test Facial Palsy (Use Grimace if Obtunded) - Normal symmetry +  0 5A: Test Left Arm Motor Drift - No Drift for 10 Seconds + 0 5B: Test Right Arm Motor Drift - No Drift for 10 Seconds + 0 6A: Test Left Leg Motor Drift - No Drift for 5 Seconds + 0 6B: Test Right Leg Motor Drift - No Drift for 5 Seconds + 0 7: Test Limb Ataxia (FNF/Heel-Shin) - No Ataxia + 0 8: Test  Sensation - Normal; No sensory loss + 0 9: Test Language/Aphasia - Normal; No aphasia + 0 10: Test Dysarthria - Normal + 0 11: Test Extinction/Inattention - No abnormality + 0  NIHSS Score: 0     Patient / Family was informed the Neurology Consult would occur via TeleHealth consult by way of interactive audio and video telecommunications and consented to receiving care in this manner.  Patient is being evaluated for possible acute neurologic impairment and high probability of imminent or life - threatening deterioration.I spent total of 35 minutes providing care to this patient, including time for face to face visit via telemedicine, review of medical records, imaging studies and discussion of findings with providers, the patient and / or family.   Dr Katina Degree   [facility] [phone]  Case [id number]

## 2020-08-29 NOTE — Evaluation (Signed)
Physical Therapy Evaluation Patient Details Name: Caleb Powell MRN: [id number] DOB: 1970-11-29 Today's Date: 08/29/2020   History of Present Illness  Caleb Powell  is a 50 y.o. male, with medical history on file but known hypertension and work-up reported for thyroid disease, and nasopharyngeal scope for neck cancer- both negative per patient report. He had these workups done 2/2 throat pain. Patient does not smoke 2-1/2 packs a day for 30 years.  He presents the ED today with a chief complaint of vision loss in his right eye.  He reports that the last 1 lasted for 55 seconds.  He was speaking incoherently for 5 minutes afterwards.  He did have dizziness.  He also had a headache, that he has been having for several days.  Patient reports that his blood pressure was also very high.  Patient reports that his blood pressure at home is routinely over 200.  He does take hydrochlorothiazide and ramipril.  He refuses his Norvasc because somebody he knew had side effects from it.  Patient reports no weakness on one side more than the other.  Patient reports he had no trouble swallowing or walking.  No focal deficits on exam and my exam.  He was given Reglan and Benadryl for headache in the ED.  Telemetry neuro was consulted and recommended starting him on full dose aspirin.  They report that is difficult to discern between TIA or complicated migraine at this point.  They also agree the patient is not having any focal deficits at this time and only has a headache that is less intense than his arrival.  Patient reports an associated decrease in appetite.  He reports he is lost 13 pounds in 5 days.  He denies any fever or cough.  Patient has no other complaints at this time.   Clinical Impression  Patient functioning at baseline for functional mobility and gait.  Plan:  Patient discharged from physical therapy to care of nursing for ambulation daily as tolerated for length of stay.      Follow Up Recommendations No PT  follow up    Equipment Recommendations  None recommended by PT    Recommendations for Other Services       Precautions / Restrictions Precautions Precautions: None Restrictions Weight Bearing Restrictions: No      Mobility  Bed Mobility Overal bed mobility: Independent                  Transfers Overall transfer level: Independent               General transfer comment: Able to stand from bed, walk in hall, and return to bed with independence.  Ambulation/Gait Ambulation/Gait assistance: Modified independent (Device/Increase time) Gait Distance (Feet): 120 Feet Assistive device: None Gait Pattern/deviations: WFL(Within Functional Limits) Gait velocity: slightly decreased   General Gait Details: demonstrates good return for ambulation in room and hallways without loss of balance  Stairs            Wheelchair Mobility    Modified Rankin (Stroke Patients Only)       Balance Overall balance assessment: Independent                                           Pertinent Vitals/Pain Pain Assessment: 0-10 Pain Score: 2  Pain Location: throat Pain Descriptors / Indicators: Sore (chronic) Pain Intervention(s): Monitored during session  Home Living Family/patient expects to be discharged to:: Private residence Living Arrangements: Children Available Help at Discharge: Family;Available 24 hours/day Type of Home: House Home Access: Stairs to enter Entrance Stairs-Rails: Right Entrance Stairs-Number of Steps: 3 Home Layout: One level Home Equipment: Bedside commode;Shower seat      Prior Function Level of Independence: Independent               Hand Dominance        Extremity/Trunk Assessment   Upper Extremity Assessment Upper Extremity Assessment: Defer to OT evaluation    Lower Extremity Assessment Lower Extremity Assessment: Overall WFL for tasks assessed    Cervical / Trunk Assessment Cervical / Trunk  Assessment: Normal  Communication   Communication: No difficulties  Cognition Arousal/Alertness: Awake/alert Behavior During Therapy: WFL for tasks assessed/performed Overall Cognitive Status: Within Functional Limits for tasks assessed                                        General Comments      Exercises     Assessment/Plan    PT Assessment Patent does not need any further PT services  PT Problem List         PT Treatment Interventions      PT Goals (Current goals can be found in the Care Plan section)  Acute Rehab PT Goals Patient Stated Goal: return home PT Goal Formulation: With patient/family Time For Goal Achievement: 08/29/20 Potential to Achieve Goals: Good    Frequency     Barriers to discharge        Co-evaluation PT/OT/SLP Co-Evaluation/Treatment: Yes Reason for Co-Treatment: To address functional/ADL transfers PT goals addressed during session: Mobility/safety with mobility;Balance OT goals addressed during session: ADL's and self-care;Strengthening/ROM       AM-PAC PT "6 Clicks" Mobility  Outcome Measure Help needed turning from your back to your side while in a flat bed without using bedrails?: None Help needed moving from lying on your back to sitting on the side of a flat bed without using bedrails?: None Help needed moving to and from a bed to a chair (including a wheelchair)?: None Help needed standing up from a chair using your arms (e.g., wheelchair or bedside chair)?: None Help needed to walk in hospital room?: None Help needed climbing 3-5 steps with a railing? : None 6 Click Score: 24    End of Session   Activity Tolerance: Patient tolerated treatment well Patient left: in bed Nurse Communication: Mobility status PT Visit Diagnosis: Unsteadiness on feet (R26.81);Other abnormalities of gait and mobility (R26.89);Muscle weakness (generalized) (M62.81)    Time: [id number] PT Time Calculation (min) (ACUTE ONLY): 15  min   Charges:   PT Evaluation $PT Eval Low Complexity: 1 Low PT Treatments $Therapeutic Activity: 8-22 mins        11:21 AM, 08/29/20 Lonell Grandchild, MPT Physical Therapist with [facility] [phone] office [phone] mobile phone

## 2020-08-29 NOTE — Discharge Summary (Signed)
Physician Discharge Summary  Caleb Powell [id number] DOB: 1970-03-06 DOA: 08/28/2020  PCP: Leslie Andrea, MD  Admit date: 08/28/2020  Discharge date: 08/29/2020  Admitted From:Home  Disposition:  [facility]:None  Equipment/Devices:None  Discharge Condition:Stable  CODE STATUS: Full  Diet recommendation: Heart Healthy  Brief/Interim Summary: Caleb Powell  is a 50 y.o. male, with medical history on file but known hypertension and work-up reported for thyroid disease, and nasopharyngeal scope for neck cancer- both negative per patient report.  He had presented to the ED on 8/23 with a chief complaint of vision loss in his right eye that lasted less than 1 minute.  He began to speak incoherently for 5 minutes afterwards and had some dizziness as well as a headache which spontaneously resolved.  He was noted to have labile blood pressure readings and eventually underwent brain MRI as well as MRA of the head and neck.  MRI did not reveal any stroke and it appears that he had a TIA.  His MRA of the neck demonstrated severe proximal ICA stenosis and case was discussed with neurology and subsequently vascular surgery due to the need for emergent right carotid endarterectomy.  He would have required transfer to [facility] for vascular evaluation, but patient refuses to go at this time and adamantly states that he would like to go home first and then will go to the ED at [facility] first thing tomorrow morning for further evaluation and work-up.  He has full decision-making capacity and is with his daughter at bedside.  He fully understands the risks of not remaining in the hospital at this time.  Discharge Diagnoses:  Active Problems:   Tobacco abuse   TIA (transient ischemic attack)   Hypertensive crisis   Leukocytosis   Chest pain   Principal discharge diagnosis: TIA.   Allergies  Allergen Reactions   Penicillins     Any "cillins"  Unknown allergy reaction.     Consultations: Neurology   Procedures/Studies: CT HEAD WO CONTRAST (5MM)  Result Date: 08/28/2020 CLINICAL DATA:  TIA, headache, vision loss in right eye EXAM: CT HEAD WITHOUT CONTRAST TECHNIQUE: Contiguous axial images were obtained from the base of the skull through the vertex without intravenous contrast. COMPARISON:  None. FINDINGS: Brain: No acute intracranial abnormality. Specifically, no hemorrhage, hydrocephalus, mass lesion, acute infarction, or significant intracranial injury. Vascular: No hyperdense vessel or unexpected calcification. Skull: No acute calvarial abnormality. Sinuses/Orbits: No acute findings Other: None IMPRESSION: No acute intracranial abnormality. Electronically Signed   By: Rolm Baptise M.D.   On: 08/28/2020 19:32   MR ANGIO HEAD WO CONTRAST  Result Date: 08/29/2020 CLINICAL DATA:  Neuro deficit, acute, stroke suspected. EXAM: MRI HEAD WITHOUT CONTRAST MRA HEAD WITHOUT CONTRAST MRA NECK WITHOUT AND WITH CONTRAST TECHNIQUE: Multiplanar, multi-echo pulse sequences of the brain and surrounding structures were acquired without intravenous contrast. Angiographic images of the Circle of Willis were acquired using MRA technique without intravenous contrast. Angiographic images of the neck were acquired using MRA technique without and with intravenous contrast. Carotid stenosis measurements (when applicable) are obtained utilizing NASCET criteria, using the distal internal carotid diameter as the denominator. CONTRAST:  32m GADAVIST GADOBUTROL 1 MMOL/ML IV SOLN COMPARISON:  CT head 08/28/2020. FINDINGS: MRI HEAD FINDINGS Brain: No acute infarction, hemorrhage, hydrocephalus, extra-axial collection or mass lesion. No pathologic intracranial enhancement. 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 Additional  mild scattered T2 hyperintensities in the white matter, nonspecific  but most likely related to chronic microvascular ischemic disease. Vascular: See below. Skull and upper cervical spine: Diffuse T1 hypointensity of the visualized cervical bone marrow. Sinuses/Orbits: Mild paranasal sinus mucosal thickening with retention cysts and the left sphenoid sinus and inferior right maxillary sinus. No acute orbital findings. Other: No mastoid effusions. MRA HEAD FINDINGS Anterior circulation: Bilateral intracranial ICAs are patent with mild bilateral paraclinoid ICA narrowing. 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 Bilateral A2 ACAs are patent. Bilateral M1 MCAs are patent without hemodynamically significant stenosis. No proximal M2 branch occlusion. Small 2 mm superiorly directed conical outpatching with vessel arising from the tip involving the right M1 MCA, compatible with infundibulum. Posterior circulation: Non dominant/small left vertebral artery with superimposed moderate intradural stenosis. Bilateral intradural vertebral arteries are patent. The basilar artery and bilateral posterior cerebral arteries are patent without proximal hemodynamically significant stenosis. Anatomic variants: Detailed above. MRA NECK FINDINGS Aortic arch: Great vessel origins are patent. Right carotid system: Moderate narrowing at the carotid bifurcation with severe short-segment stenosis of the proximal ICA (for example see series 1159, image 144; series 1146, image 6). The more distal ICA remains opacified. Left carotid system: Patent. Mild carotid bifurcation and ICA narrowing. Vertebral arteries: Right dominant. Moderate stenosis of the right vertebral artery origin with multifocal mild narrowing of the right vertebral artery. Multifocal severe stenosis of the left vertebral artery with suspected occlusion of the mid V2 segment with distal V2 reconstitution. IMPRESSION: MRI head: 1. No evidence of acute intracranial  abnormality. 2. 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 3. Diffuse T1 hypointensity of the visualized cervical bone marrow. This finding is nonspecific but can be seen with chronic anemia, chronic hypoxia (such as in smokers), obesity, or less commonly a lymphoproliferative disorder. MRA neck: 1. Severe short-segment proximal right ICA stenosis. 2. 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 3. Moderate right vertebral artery origin stenosis. MRA head: 1. No large vessel occlusion. 2. Small left vertebral artery with superimposed moderate intradural stenosis. Electronically Signed   By: Margaretha Sheffield M.D.   On: 08/29/2020 09:20   MR ANGIO NECK W WO CONTRAST  Result Date: 08/29/2020 CLINICAL DATA:  Neuro deficit, acute, stroke suspected. EXAM: MRI HEAD WITHOUT CONTRAST MRA HEAD WITHOUT CONTRAST MRA NECK WITHOUT AND WITH CONTRAST TECHNIQUE: Multiplanar, multi-echo pulse sequences of the brain and surrounding structures were acquired without intravenous contrast. Angiographic images of the Circle of Willis were acquired using MRA technique without intravenous contrast. Angiographic images of the neck were acquired using MRA technique without and with intravenous contrast. Carotid stenosis measurements (when applicable) are obtained utilizing NASCET criteria, using the distal internal carotid diameter as the denominator. CONTRAST:  46m GADAVIST GADOBUTROL 1 MMOL/ML IV SOLN COMPARISON:  CT head 08/28/2020. FINDINGS: MRI HEAD FINDINGS Brain: No acute infarction, hemorrhage, hydrocephalus, extra-axial collection or mass lesion. No pathologic intracranial enhancement. 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 Additional mild scattered T2  hyperintensities in the white matter, nonspecific but most likely related to chronic microvascular ischemic disease. Vascular: See below. Skull and upper cervical spine: Diffuse T1 hypointensity of the visualized cervical bone marrow. Sinuses/Orbits: Mild paranasal sinus mucosal  thickening with retention cysts and the left sphenoid sinus and inferior right maxillary sinus. No acute orbital findings. Other: No mastoid effusions. MRA HEAD FINDINGS Anterior circulation: Bilateral intracranial ICAs are patent with mild bilateral paraclinoid ICA narrowing. 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 Bilateral A2 ACAs are patent. Bilateral M1 MCAs are patent without hemodynamically significant stenosis. No proximal M2 branch occlusion. Small 2 mm superiorly directed conical outpatching with vessel arising from the tip involving the right M1 MCA, compatible with infundibulum. Posterior circulation: Non dominant/small left vertebral artery with superimposed moderate intradural stenosis. Bilateral intradural vertebral arteries are patent. The basilar artery and bilateral posterior cerebral arteries are patent without proximal hemodynamically significant stenosis. Anatomic variants: Detailed above. MRA NECK FINDINGS Aortic arch: Great vessel origins are patent. Right carotid system: Moderate narrowing at the carotid bifurcation with severe short-segment stenosis of the proximal ICA (for example see series 1159, image 144; series 1146, image 6). The more distal ICA remains opacified. Left carotid system: Patent. Mild carotid bifurcation and ICA narrowing. Vertebral arteries: Right dominant. Moderate stenosis of the right vertebral artery origin with multifocal mild narrowing of the right vertebral artery. Multifocal severe stenosis of the left vertebral artery with suspected occlusion of the mid V2 segment with distal V2 reconstitution. IMPRESSION:  MRI head: 1. No evidence of acute intracranial abnormality. 2. 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 3. Diffuse T1 hypointensity of the visualized cervical bone marrow. This finding is nonspecific but can be seen with chronic anemia, chronic hypoxia (such as in smokers), obesity, or less commonly a lymphoproliferative disorder. MRA neck: 1. Severe short-segment proximal right ICA stenosis. 2. 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 3. Moderate right vertebral artery origin stenosis. MRA head: 1. No large vessel occlusion. 2. Small left vertebral artery with superimposed moderate intradural stenosis. Electronically Signed   By: Margaretha Sheffield M.D.   On: 08/29/2020 09:20   MR BRAIN WO CONTRAST  Result Date: 08/29/2020 CLINICAL DATA:  Neuro deficit, acute, stroke suspected. EXAM: MRI HEAD WITHOUT CONTRAST MRA HEAD WITHOUT CONTRAST MRA NECK WITHOUT AND WITH CONTRAST TECHNIQUE: Multiplanar, multi-echo pulse sequences of the brain and surrounding structures were acquired without intravenous contrast. Angiographic images of the Circle of Willis were acquired using MRA technique without intravenous contrast. Angiographic images of the neck were acquired using MRA technique without and with intravenous contrast. Carotid stenosis measurements (when applicable) are obtained utilizing NASCET criteria, using the distal internal carotid diameter as the denominator. CONTRAST:  64m GADAVIST GADOBUTROL 1 MMOL/ML IV SOLN COMPARISON:  CT head 08/28/2020. FINDINGS: MRI HEAD FINDINGS Brain: No acute infarction, hemorrhage, hydrocephalus, extra-axial collection or mass lesion. No pathologic intracranial enhancement. 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  Additional mild scattered T2 hyperintensities in the white matter, nonspecific but most likely related to chronic microvascular ischemic disease. Vascular: See below. Skull and upper cervical spine: Diffuse T1 hypointensity of the visualized cervical bone marrow. Sinuses/Orbits: Mild paranasal sinus mucosal thickening with retention cysts and the left sphenoid sinus and inferior right maxillary sinus. No acute orbital findings. Other: No mastoid effusions. MRA HEAD FINDINGS Anterior circulation: Bilateral intracranial ICAs are patent with mild bilateral paraclinoid ICA narrowing. 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 Bilateral A2 ACAs are patent. Bilateral M1 MCAs are patent without hemodynamically significant stenosis. No proximal M2 branch occlusion. Small 2 mm superiorly directed conical outpatching with vessel arising from the tip involving the right M1 MCA, compatible with infundibulum. Posterior circulation: Non dominant/small left vertebral artery with superimposed moderate intradural stenosis. Bilateral intradural vertebral arteries are patent. The basilar artery and bilateral posterior cerebral arteries are patent without proximal hemodynamically significant stenosis. Anatomic variants: Detailed above. MRA NECK FINDINGS Aortic arch: Great vessel origins are patent. Right carotid system: Moderate narrowing at the carotid bifurcation with severe short-segment stenosis of the proximal ICA (for example see series 1159, image 144; series 1146, image 6). The more distal ICA remains opacified. Left carotid system: Patent. Mild carotid bifurcation and ICA narrowing. Vertebral arteries: Right dominant. Moderate stenosis of the right vertebral artery origin with multifocal mild narrowing of the right vertebral artery. Multifocal severe stenosis of the left vertebral artery with suspected occlusion of the mid V2 segment with distal V2  reconstitution. IMPRESSION: MRI head: 1. No evidence of acute intracranial abnormality. 2. 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 3. Diffuse T1 hypointensity of the visualized cervical bone marrow. This finding is nonspecific but can be seen with chronic anemia, chronic hypoxia (such as in smokers), obesity, or less commonly a lymphoproliferative disorder. MRA neck: 1. Severe short-segment proximal right ICA stenosis. 2. 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 3. Moderate right vertebral artery origin stenosis. MRA head: 1. No large vessel occlusion. 2. Small left vertebral artery with superimposed moderate intradural stenosis. Electronically Signed   By: Margaretha Sheffield M.D.   On: 08/29/2020 09:20   US RENAL  Result Date: 08/29/2020 CLINICAL DATA:  Hypertensive crisis EXAM: RENAL / URINARY TRACT ULTRASOUND COMPLETE COMPARISON:  None. FINDINGS: Right Kidney: Renal measurements: 11.3 cm x 6.0 cm x 6.2 cm = volume: 218 mL. Echogenicity within normal limits. No mass or hydronephrosis visualized. Left Kidney: Renal measurements: 11.7 cm x 6.5 cm x 5.4 cm = volume: 213 mL. Echogenicity within normal limits. No mass or hydronephrosis visualized. Bladder: Appears normal for degree of bladder distention. Other: None. IMPRESSION: Normal renal ultrasound. Electronically Signed   By: Valetta Mole M.D.   On: 08/29/2020 11:07   DG Chest Port 1 View  Result Date: 08/28/2020 CLINICAL DATA:  Chest pain, dyspnea EXAM: PORTABLE CHEST 1 VIEW COMPARISON:  None. FINDINGS: Lungs are clear. No pneumothorax or pleural effusion. Cardiac size within normal limits. Pulmonary vascularity is normal. Healed left mid clavicular fracture noted. No acute bone abnormality IMPRESSION: No active disease. Electronically Signed   By: Fidela Salisbury M.D.   On: 08/28/2020 19:28    ECHOCARDIOGRAM COMPLETE  Result Date: 08/29/2020    ECHOCARDIOGRAM REPORT   Patient Name:   EASTIN WHITELOW Date of Exam: 08/29/2020 Medical Rec #:  [id number]    Height:       69.0 in Accession #:    [id number]   Weight:       175.0 lb Date of Birth:  June 20, 1970     BSA:          1.952 m Patient Age:    61 years     BP:           146/70 mmHg Patient Gender: M  HR:           62 bpm. Exam Location:  [name] Procedure: 2D Echo, Cardiac Doppler and Color Doppler Indications:    TIA  History:        Patient has no prior history of Echocardiogram examinations.                 TIA, Signs/Symptoms:Chest Pain; Risk Factors:Hypertension and                 Current Smoker. COVID +.  Sonographer:    Wenda Low Referring Phys: [id number] ASIA B [place]  1. Left ventricular ejection fraction, by estimation, is 60 to 65%. The left ventricle has normal function. The left ventricle has no regional wall motion abnormalities. Left ventricular diastolic parameters were normal.  2. Right ventricular systolic function is normal. The right ventricular size is normal. Tricuspid regurgitation signal is inadequate for assessing PA pressure.  3. The mitral valve is normal in structure. No evidence of mitral valve regurgitation. No evidence of mitral stenosis.  4. The aortic valve is tricuspid. There is moderate calcification of the aortic valve. There is moderate thickening of the aortic valve. Aortic valve regurgitation is not visualized. No aortic stenosis is present. FINDINGS  Left Ventricle: Left ventricular ejection fraction, by estimation, is 60 to 65%. The left ventricle has normal function. The left ventricle has no regional wall motion abnormalities. The left ventricular internal cavity size was normal in size. There is  no left ventricular hypertrophy. Left ventricular diastolic parameters were normal. Right Ventricle: The right ventricular size is normal. No increase in right ventricular wall  thickness. Right ventricular systolic function is normal. Tricuspid regurgitation signal is inadequate for assessing PA pressure. Left Atrium: Left atrial size was normal in size. Right Atrium: Right atrial size was normal in size. Pericardium: There is no evidence of pericardial effusion. Mitral Valve: The mitral valve is normal in structure. Mild mitral annular calcification. No evidence of mitral valve regurgitation. No evidence of mitral valve stenosis. MV peak gradient, 4.0 mmHg. The mean mitral valve gradient is 2.0 mmHg. Tricuspid Valve: The tricuspid valve is normal in structure. Tricuspid valve regurgitation is not demonstrated. No evidence of tricuspid stenosis. Aortic Valve: The aortic valve is tricuspid. There is moderate calcification of the aortic valve. There is moderate thickening of the aortic valve. There is moderate aortic valve annular calcification. Aortic valve regurgitation is not visualized. No aortic stenosis is present. Aortic valve mean gradient measures 4.0 mmHg. Aortic valve peak gradient measures 8.9 mmHg. Aortic valve area, by VTI measures 2.41 cm. Pulmonic Valve: The pulmonic valve was not well visualized. Pulmonic valve regurgitation is not visualized. No evidence of pulmonic stenosis. Aorta: The aortic root is normal in size and structure. Venous: IVC is small, suggesting low RA pressure and hypovolemia. IAS/Shunts: No atrial level shunt detected by color flow Doppler.  LEFT VENTRICLE PLAX 2D LVIDd:         4.94 cm  Diastology LVIDs:         3.35 cm  LV e' medial:    9.37 cm/s LV PW:         1.06 cm  LV E/e' medial:  8.5 LV IVS:        0.91 cm  LV e' lateral:   11.60 cm/s LVOT diam:     2.00 cm  LV E/e' lateral: 6.9 LV SV:         75 LV SV Index:   39 LVOT  Area:     3.14 cm  RIGHT VENTRICLE RV Basal diam:  2.76 cm RV Mid diam:    2.75 cm LEFT ATRIUM             Index       RIGHT ATRIUM           Index LA diam:        3.90 cm 2.00 cm/m  RA Area:     15.90 cm LA Vol (A2C):   48.4  ml 24.79 ml/m RA Volume:   45.20 ml  23.15 ml/m LA Vol (A4C):   52.8 ml 27.05 ml/m LA Biplane Vol: 51.2 ml 26.23 ml/m  AORTIC VALVE AV Area (Vmax):    2.05 cm AV Area (Vmean):   2.06 cm AV Area (VTI):     2.41 cm AV Vmax:           149.00 cm/s AV Vmean:          94.700 cm/s AV VTI:            0.313 m AV Peak Grad:      8.9 mmHg AV Mean Grad:      4.0 mmHg LVOT Vmax:         97.30 cm/s LVOT Vmean:        62.200 cm/s LVOT VTI:          0.240 m LVOT/AV VTI ratio: 0.77  AORTA Ao Root diam: 2.80 cm MITRAL VALVE MV Area (PHT): 3.79 cm    SHUNTS MV Area VTI:   2.51 cm    Systemic VTI:  0.24 m MV Peak grad:  4.0 mmHg    Systemic Diam: 2.00 cm MV Mean grad:  2.0 mmHg MV Vmax:       1.00 m/s MV Vmean:      58.1 cm/s MV Decel Time: 200 msec MV E velocity: 80.10 cm/s MV A velocity: 78.00 cm/s MV E/A ratio:  1.03 Carlyle Dolly MD Electronically signed by Carlyle Dolly MD Signature Date/Time: 08/29/2020/12:08:14 PM    Final      Discharge Exam: Vitals:   08/29/20 1400 08/29/20 1600  BP: (!) 140/105 (!) 141/86  Pulse: 75 72  Resp: 16 16  Temp: 98.6 F (37 C) 98.7 F (37.1 C)  SpO2: 99% 99%   Vitals:   08/29/20 0930 08/29/20 1315 08/29/20 1400 08/29/20 1600  BP: (!) 162/61 (!) 147/70 (!) 140/105 (!) 141/86  Pulse: 75 62 75 72  Resp: '17 16 16 16  '$ Temp:   98.6 F (37 C) 98.7 F (37.1 C)  TempSrc:   Oral Oral  SpO2: 95% 93% 99% 99%  Weight:      Height:        General: Pt is alert, awake, not in acute distress Cardiovascular: RRR, S1/S2 +, no rubs, no gallops Respiratory: CTA bilaterally, no wheezing, no rhonchi Abdominal: Soft, NT, ND, bowel sounds + Extremities: no edema, no cyanosis    The results of significant diagnostics from this hospitalization (including imaging, microbiology, ancillary and laboratory) are listed below for reference.     Microbiology: Recent Results (from the past 240 hour(s))  Resp Panel by RT-PCR (Flu A&B, Covid) Nasopharyngeal Swab     Status: Abnormal    Collection Time: 08/29/20  6:00 AM   Specimen: Nasopharyngeal Swab; Nasopharyngeal(NP) swabs in vial transport medium  Result Value Ref Range Status   SARS Coronavirus 2 by RT PCR POSITIVE (A) NEGATIVE Final    Comment: CRITICAL RESULT CALLED TO,  READ BACK BY AND VERIFIED WITH: MARTIN,D AT 0910 ON 8.24.22 BY RUCINSKI,B (NOTE) SARS-CoV-2 target nucleic acids are DETECTED.  The SARS-CoV-2 RNA is generally detectable in upper respiratory specimens during the acute phase of infection. Positive results are indicative of the presence of the identified virus, but do not rule out bacterial infection or co-infection with other pathogens not detected by the test. Clinical correlation with patient history and other diagnostic information is necessary to determine patient infection status. The expected result is Negative.  Fact Sheet for Patients: EntrepreneurPulse.com.au  Fact Sheet for Healthcare Providers: IncredibleEmployment.be  This test is not yet approved or cleared by the [place] FDA and  has been authorized for detection and/or diagnosis of SARS-CoV-2 by FDA under an Emergency Use Authorization (EUA).  This EUA will remain in effect (meaning  this test can be used) for the duration of  the COVID-19 declaration under Section 564(b)(1) of the Act, 21 U.S.C. section 360bbb-3(b)(1), unless the authorization is terminated or revoked sooner.     Influenza A by PCR NEGATIVE NEGATIVE Final   Influenza B by PCR NEGATIVE NEGATIVE Final    Comment: (NOTE) The Xpert Xpress SARS-CoV-2/FLU/RSV plus assay is intended as an aid in the diagnosis of influenza from Nasopharyngeal swab specimens and should not be used as a sole basis for treatment. Nasal washings and aspirates are unacceptable for Xpert Xpress SARS-CoV-2/FLU/RSV testing.  Fact Sheet for Patients: EntrepreneurPulse.com.au  Fact Sheet for Healthcare  Providers: IncredibleEmployment.be  This test is not yet approved or cleared by the [place] FDA and has been authorized for detection and/or diagnosis of SARS-CoV-2 by FDA under an Emergency Use Authorization (EUA). This EUA will remain in effect (meaning this test can be used) for the duration of the COVID-19 declaration under Section 564(b)(1) of the Act, 21 U.S.C. section 360bbb-3(b)(1), unless the authorization is terminated or revoked.  Performed at [facility], [address] [place], [place] [address]      Labs: BNP (last 3 results) No results for input(s): BNP in the last 8760 hours. Basic Metabolic Panel: Recent Labs  Lab 08/28/20 1859  NA 136  K 3.5  CL 101  CO2 26  GLUCOSE 94  BUN 16  CREATININE 0.98  CALCIUM 9.1   Liver Function Tests: Recent Labs  Lab 08/28/20 1859  AST 15  ALT 18  ALKPHOS 81  BILITOT 0.5  PROT 7.9  ALBUMIN 4.8   No results for input(s): LIPASE, AMYLASE in the last 168 hours. No results for input(s): AMMONIA in the last 168 hours. CBC: Recent Labs  Lab 08/28/20 1859  WBC 12.5*  NEUTROABS 10.0*  HGB 16.0  HCT 47.6  MCV 91.0  PLT 270   Cardiac Enzymes: No results for input(s): CKTOTAL, CKMB, CKMBINDEX, TROPONINI in the last 168 hours. BNP: Invalid input(s): POCBNP CBG: Recent Labs  Lab 08/29/20 1610  GLUCAP 131*   D-Dimer No results for input(s): DDIMER in the last 72 hours. Hgb A1c Recent Labs    08/29/20 0436  HGBA1C 5.6   Lipid Profile Recent Labs    08/29/20 0436  CHOL 177  HDL 26*  LDLCALC 125*  TRIG 131  CHOLHDL 6.8   Thyroid function studies Recent Labs    08/29/20 0436  TSH 0.563   Anemia work up No results for input(s): VITAMINB12, FOLATE, FERRITIN, TIBC, IRON, RETICCTPCT in the last 72 hours. Urinalysis    Component Value Date/Time   COLORURINE STRAW (A) 08/28/2020 2109   APPEARANCEUR CLEAR 08/28/2020 2109  LABSPEC 1.005 08/28/2020 2109   PHURINE 6.0  08/28/2020 2109   GLUCOSEU NEGATIVE 08/28/2020 2109   HGBUR SMALL (A) 08/28/2020 2109   BILIRUBINUR NEGATIVE 08/28/2020 2109   KETONESUR NEGATIVE 08/28/2020 2109   PROTEINUR NEGATIVE 08/28/2020 2109   NITRITE NEGATIVE 08/28/2020 2109   LEUKOCYTESUR NEGATIVE 08/28/2020 2109   Sepsis Labs Invalid input(s): PROCALCITONIN,  WBC,  LACTICIDVEN Microbiology Recent Results (from the past 240 hour(s))  Resp Panel by RT-PCR (Flu A&B, Covid) Nasopharyngeal Swab     Status: Abnormal   Collection Time: 08/29/20  6:00 AM   Specimen: Nasopharyngeal Swab; Nasopharyngeal(NP) swabs in vial transport medium  Result Value Ref Range Status   SARS Coronavirus 2 by RT PCR POSITIVE (A) NEGATIVE Final    Comment: CRITICAL RESULT CALLED TO, READ BACK BY AND VERIFIED WITH: MARTIN,D AT 0910 ON 8.24.22 BY RUCINSKI,B (NOTE) SARS-CoV-2 target nucleic acids are DETECTED.  The SARS-CoV-2 RNA is generally detectable in upper respiratory specimens during the acute phase of infection. Positive results are indicative of the presence of the identified virus, but do not rule out bacterial infection or co-infection with other pathogens not detected by the test. Clinical correlation with patient history and other diagnostic information is necessary to determine patient infection status. The expected result is Negative.  Fact Sheet for Patients: EntrepreneurPulse.com.au  Fact Sheet for Healthcare Providers: IncredibleEmployment.be  This test is not yet approved or cleared by the [place] FDA and  has been authorized for detection and/or diagnosis of SARS-CoV-2 by FDA under an Emergency Use Authorization (EUA).  This EUA will remain in effect (meaning  this test can be used) for the duration of  the COVID-19 declaration under Section 564(b)(1) of the Act, 21 U.S.C. section 360bbb-3(b)(1), unless the authorization is terminated or revoked sooner.     Influenza A by PCR  NEGATIVE NEGATIVE Final   Influenza B by PCR NEGATIVE NEGATIVE Final    Comment: (NOTE) The Xpert Xpress SARS-CoV-2/FLU/RSV plus assay is intended as an aid in the diagnosis of influenza from Nasopharyngeal swab specimens and should not be used as a sole basis for treatment. Nasal washings and aspirates are unacceptable for Xpert Xpress SARS-CoV-2/FLU/RSV testing.  Fact Sheet for Patients: EntrepreneurPulse.com.au  Fact Sheet for Healthcare Providers: IncredibleEmployment.be  This test is not yet approved or cleared by the [place] FDA and has been authorized for detection and/or diagnosis of SARS-CoV-2 by FDA under an Emergency Use Authorization (EUA). This EUA will remain in effect (meaning this test can be used) for the duration of the COVID-19 declaration under Section 564(b)(1) of the Act, 21 U.S.C. section 360bbb-3(b)(1), unless the authorization is terminated or revoked.  Performed at [facility], [address], [place], [place] [address]      Time coordinating discharge: 35 minutes  SIGNED:   Rodena Goldmann, DO [facility] 08/29/2020, 4:35 PM  If 7PM-7AM, please contact night-coverage [email or web address]

## 2020-08-29 NOTE — ED Notes (Signed)
ED TO INPATIENT HANDOFF REPORT  ED Nurse Name and Phone #:   S Name/Age/Gender Caleb Powell 50 y.o. male Room/Bed: APA17/APA17  Code Status   Code Status: Full Code  Home/SNF/Other Home Patient oriented to: self, place, time and situation Is this baseline? Yes   Triage Complete: Triage complete  Chief Complaint TIA (transient ischemic attack) [G45.9]  Triage Note No notes on file   Allergies Allergies  Allergen Reactions  . Penicillins     Any "cillins"  Unknown allergy reaction.    Level of Care/Admitting Diagnosis ED Disposition    ED Disposition  Admit   Condition  --   [place]: [facility] [id number]  Level of Care: Telemetry [5]  Covid Evaluation: Asymptomatic Screening Protocol (No Symptoms)  Diagnosis: TIA (transient ischemic attack) [id number]  Admitting Physician: Rolla Plate [id number]  Attending Physician: Rolla Plate [id number]         B Medical/Surgery History History reviewed. No pertinent past medical history. History reviewed. No pertinent surgical history.   A IV Location/Drains/Wounds Patient Lines/Drains/Airways Status    Active Line/Drains/Airways    Name Placement date Placement time Site Days   Peripheral IV 08/28/20 20 G Right Antecubital 08/28/20  1910  Antecubital  1          Intake/Output Last 24 hours  Intake/Output Summary (Last 24 hours) at 08/29/2020 1328 Last data filed at 08/28/2020 2322 Gross per 24 hour  Intake 1000 ml  Output --  Net 1000 ml    Labs/Imaging Results for orders placed or performed during the hospital encounter of 08/28/20 (from the past 48 hour(s))  Comprehensive metabolic panel     Status: None   Collection Time: 08/28/20  6:59 PM  Result Value Ref Range   Sodium 136 135 - 145 mmol/L   Potassium 3.5 3.5 - 5.1 mmol/L   Chloride 101 98 - 111 mmol/L   CO2 26 22 - 32 mmol/L   Glucose, Bld 94 70 - 99 mg/dL    Comment: Glucose reference range applies  only to samples taken after fasting for at least 8 hours.   BUN 16 6 - 20 mg/dL   Creatinine, Ser 0.98 0.61 - 1.24 mg/dL   Calcium 9.1 8.9 - 10.3 mg/dL   Total Protein 7.9 6.5 - 8.1 g/dL   Albumin 4.8 3.5 - 5.0 g/dL   AST 15 15 - 41 U/L   ALT 18 0 - 44 U/L   Alkaline Phosphatase 81 38 - 126 U/L   Total Bilirubin 0.5 0.3 - 1.2 mg/dL   GFR, Estimated >60 >60 mL/min    Comment: (NOTE) Calculated using the CKD-EPI Creatinine Equation (2021)    Anion gap 9 5 - 15    Comment: Performed at [facility], [address] [place], [place] [address]  CBC with Differential     Status: Abnormal   Collection Time: 08/28/20  6:59 PM  Result Value Ref Range   WBC 12.5 (H) 4.0 - 10.5 K/uL   RBC 5.23 4.22 - 5.81 MIL/uL   Hemoglobin 16.0 13.0 - 17.0 g/dL   HCT 47.6 39.0 - 52.0 %   MCV 91.0 80.0 - 100.0 fL   MCH 30.6 26.0 - 34.0 pg   MCHC 33.6 30.0 - 36.0 g/dL   RDW 11.9 11.5 - 15.5 %   Platelets 270 150 - 400 K/uL   nRBC 0.0 0.0 - 0.2 %   Neutrophils Relative % 79 %   Neutro Abs  10.0 (H) 1.7 - 7.7 K/uL   Lymphocytes Relative 8 %   Lymphs Abs 1.0 0.7 - 4.0 K/uL   Monocytes Relative 10 %   Monocytes Absolute 1.2 (H) 0.1 - 1.0 K/uL   Eosinophils Relative 1 %   Eosinophils Absolute 0.1 0.0 - 0.5 K/uL   Basophils Relative 1 %   Basophils Absolute 0.1 0.0 - 0.1 K/uL   Immature Granulocytes 1 %   Abs Immature Granulocytes 0.09 (H) 0.00 - 0.07 K/uL    Comment: Performed at [facility], [address], [place], [place] [address]  Protime-INR     Status: None   Collection Time: 08/28/20  6:59 PM  Result Value Ref Range   Prothrombin Time 13.9 11.4 - 15.2 seconds   INR 1.1 0.8 - 1.2    Comment: (NOTE) INR goal varies based on device and disease states. Performed at [facility], [address] [place], [place] [address]   Troponin I (High Sensitivity)     Status: None   Collection Time: 08/28/20  6:59 PM  Result Value Ref Range   Troponin I (High Sensitivity) 5 <18 ng/L    Comment:  (NOTE) Elevated high sensitivity troponin I (hsTnI) values and significant  changes across serial measurements may suggest ACS but many other  chronic and acute conditions are known to elevate hsTnI results.  Refer to the "Links" section for chest pain algorithms and additional  guidance. Performed at [facility], [address], [place], [place] [address]   Troponin I (High Sensitivity)     Status: None   Collection Time: 08/28/20  8:55 PM  Result Value Ref Range   Troponin I (High Sensitivity) 6 <18 ng/L    Comment: (NOTE) Elevated high sensitivity troponin I (hsTnI) values and significant  changes across serial measurements may suggest ACS but many other  chronic and acute conditions are known to elevate hsTnI results.  Refer to the "Links" section for chest pain algorithms and additional  guidance. Performed at [facility], [address] [place], [place] [address]   Urinalysis, Routine w reflex microscopic Urine, Clean Catch     Status: Abnormal   Collection Time: 08/28/20  9:09 PM  Result Value Ref Range   Color, Urine STRAW (A) YELLOW   APPearance CLEAR CLEAR   Specific Gravity, Urine 1.005 1.005 - 1.030   pH 6.0 5.0 - 8.0   Glucose, UA NEGATIVE NEGATIVE mg/dL   Hgb urine dipstick SMALL (A) NEGATIVE   Bilirubin Urine NEGATIVE NEGATIVE   Ketones, ur NEGATIVE NEGATIVE mg/dL   Protein, ur NEGATIVE NEGATIVE mg/dL   Nitrite NEGATIVE NEGATIVE   Leukocytes,Ua NEGATIVE NEGATIVE   RBC / HPF 0-5 0 - 5 RBC/hpf   WBC, UA 0-5 0 - 5 WBC/hpf   Bacteria, UA NONE SEEN NONE SEEN   Mucus PRESENT     Comment: Performed at [facility], [address], [place],  [address]  Urine rapid drug screen (hosp performed)     Status: None   Collection Time: 08/29/20  1:06 AM  Result Value Ref Range   Opiates NONE DETECTED NONE DETECTED   Cocaine NONE DETECTED NONE DETECTED   Benzodiazepines NONE DETECTED NONE DETECTED   Amphetamines NONE DETECTED NONE DETECTED   Tetrahydrocannabinol  NONE DETECTED NONE DETECTED   Barbiturates NONE DETECTED NONE DETECTED    Comment: (NOTE) DRUG SCREEN FOR MEDICAL PURPOSES ONLY.  IF CONFIRMATION IS NEEDED FOR ANY PURPOSE, NOTIFY LAB WITHIN 5 DAYS.  LOWEST DETECTABLE LIMITS FOR URINE  DRUG SCREEN Drug Class                     Cutoff (ng/mL) Amphetamine and metabolites    1000 Barbiturate and metabolites    200 Benzodiazepine                 [id number] Tricyclics and metabolites     300 Opiates and metabolites        300 Cocaine and metabolites        300 THC                            50 Performed at [facility], [address] [place], [place] [address]   HIV Antibody (routine testing w rflx)     Status: None   Collection Time: 08/29/20  4:36 AM  Result Value Ref Range   HIV Screen 4th Generation wRfx Non Reactive Non Reactive    Comment: Performed at [facility], [place] [address] [place], [place] [address]  Hemoglobin A1c     Status: None   Collection Time: 08/29/20  4:36 AM  Result Value Ref Range   Hgb A1c MFr Bld 5.6 4.8 - 5.6 %    Comment: (NOTE) Pre diabetes:          5.7%-6.4%  Diabetes:              >6.4%  Glycemic control for   <7.0% adults with diabetes    Mean Plasma Glucose 114.02 mg/dL    Comment: Performed at [place] [address] [place], [place] [address]  Lipid panel     Status: Abnormal   Collection Time: 08/29/20  4:36 AM  Result Value Ref Range   Cholesterol 177 0 - 200 mg/dL   Triglycerides 131 <150 mg/dL   HDL 26 (L) >40 mg/dL   Total CHOL/HDL Ratio 6.8 RATIO   VLDL 26 0 - 40 mg/dL   LDL Cholesterol 125 (H) 0 - 99 mg/dL    Comment:        Total Cholesterol/HDL:CHD Risk Coronary Heart Disease Risk Table                     Men   Women  1/2 Average Risk   3.4   3.3  Average Risk       5.0   4.4  2 X Average Risk   9.6   7.1  3 X Average Risk  23.4   11.0        Use the calculated Patient Ratio above and the CHD Risk Table to determine the patient's CHD Risk.        ATP  III CLASSIFICATION (LDL):  <100     mg/dL   Optimal  100-129  mg/dL   Near or Above                    Optimal  130-159  mg/dL   Borderline  160-189  mg/dL   High  >190     mg/dL   Very High Performed at [facility], [address], [place], [place] [address]   TSH     Status: None   Collection Time: 08/29/20  4:36 AM  Result Value Ref Range   TSH 0.563 0.350 - 4.500 uIU/mL    Comment: Performed by a 3rd Generation assay with a functional sensitivity of <=0.01 uIU/mL. Performed at [facility], [address] [place], [place] [address]   Resp Panel by RT-PCR (Flu A&B, Covid) Nasopharyngeal Swab     Status: Abnormal   Collection Time: 08/29/20  6:00 AM   Specimen: Nasopharyngeal Swab; Nasopharyngeal(NP) swabs in vial transport medium  Result Value Ref Range   SARS Coronavirus 2 by RT PCR POSITIVE (A) NEGATIVE    Comment: CRITICAL RESULT CALLED TO, READ BACK BY AND VERIFIED WITH: MARTIN,D AT 0910 ON 8.24.22 BY RUCINSKI,B (NOTE) SARS-CoV-2 target nucleic acids are DETECTED.  The SARS-CoV-2 RNA is generally detectable in upper respiratory specimens during the acute phase of infection. Positive results are indicative of the presence of the identified virus, but do not rule out bacterial infection or co-infection with other pathogens not detected by the test. Clinical correlation with patient history and other diagnostic information is necessary to determine patient infection status. The expected result is Negative.  Fact Sheet for Patients: EntrepreneurPulse.com.au  Fact Sheet for Healthcare Providers: IncredibleEmployment.be  This test is not yet approved or cleared by the [place] FDA and  has been authorized for detection and/or diagnosis of SARS-CoV-2 by FDA under an Emergency Use Authorization (EUA).  This EUA will remain in effect (meaning  this test can be used) for the duration of  the COVID-19 declaration under Section 564(b)(1)  of the Act, 21 U.S.C. section 360bbb-3(b)(1), unless the authorization is terminated or revoked sooner.     Influenza A by PCR NEGATIVE NEGATIVE   Influenza B by PCR NEGATIVE NEGATIVE    Comment: (NOTE) The Xpert Xpress SARS-CoV-2/FLU/RSV plus assay is intended as an aid in the diagnosis of influenza from Nasopharyngeal swab specimens and should not be used as a sole basis for treatment. Nasal washings and aspirates are unacceptable for Xpert Xpress SARS-CoV-2/FLU/RSV testing.  Fact Sheet for Patients: EntrepreneurPulse.com.au  Fact Sheet for Healthcare Providers: IncredibleEmployment.be  This test is not yet approved or cleared by the [place] FDA and has been authorized for detection and/or diagnosis of SARS-CoV-2 by FDA under an Emergency Use Authorization (EUA). This EUA will remain in effect (meaning this test can be used) for the duration of the COVID-19 declaration under Section 564(b)(1) of the Act, 21 U.S.C. section 360bbb-3(b)(1), unless the authorization is terminated or revoked.  Performed at [facility], [address] [place], [place] [address]    CT HEAD WO CONTRAST (5MM)  Result Date: 08/28/2020 CLINICAL DATA:  TIA, headache, vision loss in right eye EXAM: CT HEAD WITHOUT CONTRAST TECHNIQUE: Contiguous axial images were obtained from the base of the skull through the vertex without intravenous contrast. COMPARISON:  None. FINDINGS: Brain: No acute intracranial abnormality. Specifically, no hemorrhage, hydrocephalus, mass lesion, acute infarction, or significant intracranial injury. Vascular: No hyperdense vessel or unexpected calcification. Skull: No acute calvarial abnormality. Sinuses/Orbits: No acute findings Other: None IMPRESSION: No acute intracranial abnormality. Electronically Signed   By: Rolm Baptise M.D.   On: 08/28/2020 19:32   MR ANGIO HEAD WO CONTRAST  Result Date: 08/29/2020 CLINICAL DATA:  Neuro deficit,  acute, stroke suspected. EXAM: MRI HEAD WITHOUT CONTRAST MRA HEAD WITHOUT CONTRAST MRA NECK WITHOUT AND WITH CONTRAST TECHNIQUE: Multiplanar, multi-echo pulse sequences of the brain and surrounding structures were acquired without intravenous contrast. Angiographic images of the Circle of Willis were acquired using MRA technique without intravenous contrast. Angiographic images of the neck were acquired using MRA technique without and with intravenous contrast. Carotid stenosis measurements (when applicable) are obtained utilizing NASCET criteria, using the distal internal carotid diameter as  the denominator. CONTRAST:  90m GADAVIST GADOBUTROL 1 MMOL/ML IV SOLN COMPARISON:  CT head 08/28/2020. FINDINGS: MRI HEAD FINDINGS Brain: No acute infarction, hemorrhage, hydrocephalus, extra-axial collection or mass lesion. No pathologic intracranial enhancement. 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 Additional mild scattered T2 hyperintensities in the white matter, nonspecific but most likely related to chronic microvascular ischemic disease. Vascular: See below. Skull and upper cervical spine: Diffuse T1 hypointensity of the visualized cervical bone marrow. Sinuses/Orbits: Mild paranasal sinus mucosal thickening with retention cysts and the left sphenoid sinus and inferior right maxillary sinus. No acute orbital findings. Other: No mastoid effusions. MRA HEAD FINDINGS Anterior circulation: Bilateral intracranial ICAs are patent with mild bilateral paraclinoid ICA narrowing. 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 Bilateral A2 ACAs are patent. Bilateral M1 MCAs are patent without hemodynamically significant stenosis. No proximal M2 branch occlusion. Small 2 mm superiorly directed conical outpatching with vessel arising from the tip  involving the right M1 MCA, compatible with infundibulum. Posterior circulation: Non dominant/small left vertebral artery with superimposed moderate intradural stenosis. Bilateral intradural vertebral arteries are patent. The basilar artery and bilateral posterior cerebral arteries are patent without proximal hemodynamically significant stenosis. Anatomic variants: Detailed above. MRA NECK FINDINGS Aortic arch: Great vessel origins are patent. Right carotid system: Moderate narrowing at the carotid bifurcation with severe short-segment stenosis of the proximal ICA (for example see series 1159, image 144; series 1146, image 6). The more distal ICA remains opacified. Left carotid system: Patent. Mild carotid bifurcation and ICA narrowing. Vertebral arteries: Right dominant. Moderate stenosis of the right vertebral artery origin with multifocal mild narrowing of the right vertebral artery. Multifocal severe stenosis of the left vertebral artery with suspected occlusion of the mid V2 segment with distal V2 reconstitution. IMPRESSION: MRI head: 1. No evidence of acute intracranial abnormality. 2. 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 3. Diffuse T1 hypointensity of the visualized cervical bone marrow. This finding is nonspecific but can be seen with chronic anemia, chronic hypoxia (such as in smokers), obesity, or less commonly a lymphoproliferative disorder. MRA neck: 1. Severe short-segment proximal right ICA stenosis. 2. 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 3. Moderate right vertebral artery origin stenosis. MRA head: 1. No large vessel occlusion. 2. Small left vertebral artery with superimposed moderate intradural stenosis. Electronically Signed   By: FMargaretha SheffieldM.D.   On: 08/29/2020 09:20   MR ANGIO NECK W WO CONTRAST  Result Date:  08/29/2020 CLINICAL DATA:  Neuro deficit, acute, stroke suspected. EXAM: MRI HEAD WITHOUT CONTRAST MRA HEAD WITHOUT CONTRAST MRA NECK WITHOUT AND WITH CONTRAST TECHNIQUE: Multiplanar, multi-echo pulse sequences of the brain and surrounding structures were acquired without intravenous contrast. Angiographic images of the Circle of Willis were acquired using MRA technique without intravenous contrast. Angiographic images of the neck were acquired using MRA technique without and with intravenous contrast. Carotid stenosis measurements (when applicable) are obtained utilizing NASCET criteria, using the distal internal carotid diameter as the denominator. CONTRAST:  744mGADAVIST GADOBUTROL 1 MMOL/ML IV SOLN COMPARISON:  CT head 08/28/2020. FINDINGS: MRI HEAD FINDINGS Brain: No acute infarction, hemorrhage, hydrocephalus, extra-axial collection or mass lesion. No pathologic intracranial enhancement. Small remote cortical infarct in the  right frontal lobe (series 10, image 18) and small remote infarcts in the left cerebellum (series 10, image 4). Additional mild scattered T2 hyperintensities in the white matter, nonspecific but most likely related to chronic microvascular ischemic disease. Vascular: See below. Skull and upper cervical spine: Diffuse T1 hypointensity of the visualized cervical bone marrow. Sinuses/Orbits: Mild paranasal sinus mucosal thickening with retention cysts and the left sphenoid sinus and inferior right maxillary sinus. No acute orbital findings. Other: No mastoid effusions. MRA HEAD FINDINGS Anterior circulation: Bilateral intracranial ICAs are patent with mild bilateral paraclinoid ICA narrowing. 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 Bilateral A2 ACAs are patent. Bilateral M1 MCAs are patent without hemodynamically significant stenosis. No proximal M2 branch occlusion. Small 2 mm superiorly directed conical  outpatching with vessel arising from the tip involving the right M1 MCA, compatible with infundibulum. Posterior circulation: Non dominant/small left vertebral artery with superimposed moderate intradural stenosis. Bilateral intradural vertebral arteries are patent. The basilar artery and bilateral posterior cerebral arteries are patent without proximal hemodynamically significant stenosis. Anatomic variants: Detailed above. MRA NECK FINDINGS Aortic arch: Great vessel origins are patent. Right carotid system: Moderate narrowing at the carotid bifurcation with severe short-segment stenosis of the proximal ICA (for example see series 1159, image 144; series 1146, image 6). The more distal ICA remains opacified. Left carotid system: Patent. Mild carotid bifurcation and ICA narrowing. Vertebral arteries: Right dominant. Moderate stenosis of the right vertebral artery origin with multifocal mild narrowing of the right vertebral artery. Multifocal severe stenosis of the left vertebral artery with suspected occlusion of the mid V2 segment with distal V2 reconstitution. IMPRESSION: MRI head: 1. No evidence of acute intracranial abnormality. 2. 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 3. Diffuse T1 hypointensity of the visualized cervical bone marrow. This finding is nonspecific but can be seen with chronic anemia, chronic hypoxia (such as in smokers), obesity, or less commonly a lymphoproliferative disorder. MRA neck: 1. Severe short-segment proximal right ICA stenosis. 2. 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 3. Moderate right vertebral artery origin stenosis. MRA head: 1. No large vessel occlusion. 2. Small left vertebral artery with superimposed moderate intradural stenosis. Electronically Signed   By: Margaretha Sheffield M.D.   On: 08/29/2020 09:20   MR  BRAIN WO CONTRAST  Result Date: 08/29/2020 CLINICAL DATA:  Neuro deficit, acute, stroke suspected. EXAM: MRI HEAD WITHOUT CONTRAST MRA HEAD WITHOUT CONTRAST MRA NECK WITHOUT AND WITH CONTRAST TECHNIQUE: Multiplanar, multi-echo pulse sequences of the brain and surrounding structures were acquired without intravenous contrast. Angiographic images of the Circle of Willis were acquired using MRA technique without intravenous contrast. Angiographic images of the neck were acquired using MRA technique without and with intravenous contrast. Carotid stenosis measurements (when applicable) are obtained utilizing NASCET criteria, using the distal internal carotid diameter as the denominator. CONTRAST:  79m GADAVIST GADOBUTROL 1 MMOL/ML IV SOLN COMPARISON:  CT head 08/28/2020. FINDINGS: MRI HEAD FINDINGS Brain: No acute infarction, hemorrhage, hydrocephalus, extra-axial collection or mass lesion. No pathologic intracranial enhancement. 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 Additional mild scattered T2 hyperintensities in the white matter, nonspecific but most likely related to chronic microvascular ischemic disease. Vascular: See below. Skull and  upper cervical spine: Diffuse T1 hypointensity of the visualized cervical bone marrow. Sinuses/Orbits: Mild paranasal sinus mucosal thickening with retention cysts and the left sphenoid sinus and inferior right maxillary sinus. No acute orbital findings. Other: No mastoid effusions. MRA HEAD FINDINGS Anterior circulation: Bilateral intracranial ICAs are patent with mild bilateral paraclinoid ICA narrowing. 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 Bilateral A2 ACAs are patent. Bilateral M1 MCAs are patent without hemodynamically significant stenosis. No proximal M2 branch occlusion. Small 2 mm  superiorly directed conical outpatching with vessel arising from the tip involving the right M1 MCA, compatible with infundibulum. Posterior circulation: Non dominant/small left vertebral artery with superimposed moderate intradural stenosis. Bilateral intradural vertebral arteries are patent. The basilar artery and bilateral posterior cerebral arteries are patent without proximal hemodynamically significant stenosis. Anatomic variants: Detailed above. MRA NECK FINDINGS Aortic arch: Great vessel origins are patent. Right carotid system: Moderate narrowing at the carotid bifurcation with severe short-segment stenosis of the proximal ICA (for example see series 1159, image 144; series 1146, image 6). The more distal ICA remains opacified. Left carotid system: Patent. Mild carotid bifurcation and ICA narrowing. Vertebral arteries: Right dominant. Moderate stenosis of the right vertebral artery origin with multifocal mild narrowing of the right vertebral artery. Multifocal severe stenosis of the left vertebral artery with suspected occlusion of the mid V2 segment with distal V2 reconstitution. IMPRESSION: MRI head: 1. No evidence of acute intracranial abnormality. 2. 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 3. Diffuse T1 hypointensity of the visualized cervical bone marrow. This finding is nonspecific but can be seen with chronic anemia, chronic hypoxia (such as in smokers), obesity, or less commonly a lymphoproliferative disorder. MRA neck: 1. Severe short-segment proximal right ICA stenosis. 2. 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 3. Moderate right vertebral artery origin stenosis. MRA head: 1. No large vessel occlusion. 2. Small left vertebral artery with superimposed moderate intradural stenosis. Electronically Signed   By: Margaretha Sheffield M.D.   On:  08/29/2020 09:20   US RENAL  Result Date: 08/29/2020 CLINICAL DATA:  Hypertensive crisis EXAM: RENAL / URINARY TRACT ULTRASOUND COMPLETE COMPARISON:  None. FINDINGS: Right Kidney: Renal measurements: 11.3 cm x 6.0 cm x 6.2 cm = volume: 218 mL. Echogenicity within normal limits. No mass or hydronephrosis visualized. Left Kidney: Renal measurements: 11.7 cm x 6.5 cm x 5.4 cm = volume: 213 mL. Echogenicity within normal limits. No mass or hydronephrosis visualized. Bladder: Appears normal for degree of bladder distention. Other: None. IMPRESSION: Normal renal ultrasound. Electronically Signed   By: Valetta Mole M.D.   On: 08/29/2020 11:07   DG Chest Port 1 View  Result Date: 08/28/2020 CLINICAL DATA:  Chest pain, dyspnea EXAM: PORTABLE CHEST 1 VIEW COMPARISON:  None. FINDINGS: Lungs are clear. No pneumothorax or pleural effusion. Cardiac size within normal limits. Pulmonary vascularity is normal. Healed left mid clavicular fracture noted. No acute bone abnormality IMPRESSION: No active disease. Electronically Signed   By: Fidela Salisbury M.D.   On: 08/28/2020 19:28   ECHOCARDIOGRAM COMPLETE  Result Date: 08/29/2020    ECHOCARDIOGRAM REPORT   Patient Name:   Caleb Powell Date of Exam: 08/29/2020 Medical Rec #:  [id number]    Height:       69.0 in Accession #:    [id number]  Weight:       175.0 lb Date of Birth:  11-Mar-1970     BSA:          1.952 m Patient Age:    17 years     BP:           146/70 mmHg Patient Gender: M            HR:           62 bpm. Exam Location:  [name] Procedure: 2D Echo, Cardiac Doppler and Color Doppler Indications:    TIA  History:        Patient has no prior history of Echocardiogram examinations.                 TIA, Signs/Symptoms:Chest Pain; Risk Factors:Hypertension and                 Current Smoker. COVID +.  Sonographer:    Wenda Low Referring Phys: [id number] ASIA B [place]  1. Left ventricular ejection fraction, by estimation, is 60 to 65%. The left  ventricle has normal function. The left ventricle has no regional wall motion abnormalities. Left ventricular diastolic parameters were normal.  2. Right ventricular systolic function is normal. The right ventricular size is normal. Tricuspid regurgitation signal is inadequate for assessing PA pressure.  3. The mitral valve is normal in structure. No evidence of mitral valve regurgitation. No evidence of mitral stenosis.  4. The aortic valve is tricuspid. There is moderate calcification of the aortic valve. There is moderate thickening of the aortic valve. Aortic valve regurgitation is not visualized. No aortic stenosis is present. FINDINGS  Left Ventricle: Left ventricular ejection fraction, by estimation, is 60 to 65%. The left ventricle has normal function. The left ventricle has no regional wall motion abnormalities. The left ventricular internal cavity size was normal in size. There is  no left ventricular hypertrophy. Left ventricular diastolic parameters were normal. Right Ventricle: The right ventricular size is normal. No increase in right ventricular wall thickness. Right ventricular systolic function is normal. Tricuspid regurgitation signal is inadequate for assessing PA pressure. Left Atrium: Left atrial size was normal in size. Right Atrium: Right atrial size was normal in size. Pericardium: There is no evidence of pericardial effusion. Mitral Valve: The mitral valve is normal in structure. Mild mitral annular calcification. No evidence of mitral valve regurgitation. No evidence of mitral valve stenosis. MV peak gradient, 4.0 mmHg. The mean mitral valve gradient is 2.0 mmHg. Tricuspid Valve: The tricuspid valve is normal in structure. Tricuspid valve regurgitation is not demonstrated. No evidence of tricuspid stenosis. Aortic Valve: The aortic valve is tricuspid. There is moderate calcification of the aortic valve. There is moderate thickening of the aortic valve. There is moderate aortic valve annular  calcification. Aortic valve regurgitation is not visualized. No aortic stenosis is present. Aortic valve mean gradient measures 4.0 mmHg. Aortic valve peak gradient measures 8.9 mmHg. Aortic valve area, by VTI measures 2.41 cm. Pulmonic Valve: The pulmonic valve was not well visualized. Pulmonic valve regurgitation is not visualized. No evidence of pulmonic stenosis. Aorta: The aortic root is normal in size and structure. Venous: IVC is small, suggesting low RA pressure and hypovolemia. IAS/Shunts: No atrial level shunt detected by color flow Doppler.  LEFT VENTRICLE PLAX 2D LVIDd:         4.94 cm  Diastology LVIDs:         3.35 cm  LV e' medial:    9.37  cm/s LV PW:         1.06 cm  LV E/e' medial:  8.5 LV IVS:        0.91 cm  LV e' lateral:   11.60 cm/s LVOT diam:     2.00 cm  LV E/e' lateral: 6.9 LV SV:         75 LV SV Index:   39 LVOT Area:     3.14 cm  RIGHT VENTRICLE RV Basal diam:  2.76 cm RV Mid diam:    2.75 cm LEFT ATRIUM             Index       RIGHT ATRIUM           Index LA diam:        3.90 cm 2.00 cm/m  RA Area:     15.90 cm LA Vol (A2C):   48.4 ml 24.79 ml/m RA Volume:   45.20 ml  23.15 ml/m LA Vol (A4C):   52.8 ml 27.05 ml/m LA Biplane Vol: 51.2 ml 26.23 ml/m  AORTIC VALVE AV Area (Vmax):    2.05 cm AV Area (Vmean):   2.06 cm AV Area (VTI):     2.41 cm AV Vmax:           149.00 cm/s AV Vmean:          94.700 cm/s AV VTI:            0.313 m AV Peak Grad:      8.9 mmHg AV Mean Grad:      4.0 mmHg LVOT Vmax:         97.30 cm/s LVOT Vmean:        62.200 cm/s LVOT VTI:          0.240 m LVOT/AV VTI ratio: 0.77  AORTA Ao Root diam: 2.80 cm MITRAL VALVE MV Area (PHT): 3.79 cm    SHUNTS MV Area VTI:   2.51 cm    Systemic VTI:  0.24 m MV Peak grad:  4.0 mmHg    Systemic Diam: 2.00 cm MV Mean grad:  2.0 mmHg MV Vmax:       1.00 m/s MV Vmean:      58.1 cm/s MV Decel Time: 200 msec MV E velocity: 80.10 cm/s MV A velocity: 78.00 cm/s MV E/A ratio:  1.03 Carlyle Dolly MD Electronically signed by  Carlyle Dolly MD Signature Date/Time: 08/29/2020/12:08:14 PM    Final     Pending Labs Unresulted Labs (From admission, onward)   None      Vitals/Pain Today's Vitals   08/29/20 0705 08/29/20 0900 08/29/20 0930 08/29/20 1315  BP:  130/75 (!) 162/61 (!) 147/70  Pulse:  63 75 62  Resp:  '16 17 16  '$ Temp:      SpO2:  97% 95% 93%  Weight:      Height:      PainSc: Asleep       Isolation Precautions Airborne and Contact precautions  Medications Medications  amLODipine (NORVASC) tablet 5 mg (5 mg Oral Given 08/29/20 1015)  hydrochlorothiazide (MICROZIDE) capsule 12.5 mg (12.5 mg Oral Given 08/29/20 1014)  ramipril (ALTACE) capsule 5 mg (has no administration in time range)   stroke: mapping our early stages of recovery book (has no administration in time range)  acetaminophen (TYLENOL) tablet 650 mg (has no administration in time range)    Or  acetaminophen (TYLENOL) 160 MG/5ML solution 650 mg (has no administration in time range)  Or  acetaminophen (TYLENOL) suppository 650 mg (has no administration in time range)  senna-docusate (Senokot-S) tablet 1 tablet (has no administration in time range)  heparin injection 5,000 Units (5,000 Units Subcutaneous Given 08/29/20 0559)  LORazepam (ATIVAN) tablet 0.5 mg (0.5 mg Oral Given 08/29/20 1014)  hydrALAZINE (APRESOLINE) injection 10 mg (has no administration in time range)  0.9 %  sodium chloride infusion ( Intravenous New Bag/Given 08/29/20 0026)  aspirin tablet 325 mg (325 mg Oral Given 08/29/20 0104)  albuterol (PROVENTIL) (2.5 MG/3ML) 0.083% nebulizer solution 2.5 mg (has no administration in time range)  SUMAtriptan (IMITREX) tablet 50 mg (has no administration in time range)  aspirin 325 MG tablet (  Not Given 08/29/20 0107)  nicotine (NICODERM CQ - dosed in mg/24 hours) patch 21 mg (21 mg Transdermal Patch Applied 08/29/20 1015)  metoCLOPramide (REGLAN) injection 10 mg (10 mg Intravenous Given 08/28/20 2234)  sodium chloride 0.9 %  bolus 1,000 mL (0 mLs Intravenous Stopped 08/28/20 2322)  diphenhydrAMINE (BENADRYL) injection 12.5 mg (12.5 mg Intravenous Given 08/28/20 2234)  ondansetron (ZOFRAN) injection 4 mg (4 mg Intravenous Given 08/28/20 2233)  gadobutrol (GADAVIST) 1 MMOL/ML injection 7 mL (7 mLs Intravenous Contrast Given 08/29/20 0837)    Mobility walks     Focused Assessments    R Recommendations: See Admitting Provider Note  Report given to:   Additional Notes:

## 2020-08-29 NOTE — ED Notes (Signed)
Pt is requesting to leave AMA.  Dr Manuella Ghazi informed and says pt can sign out AMA.

## 2020-08-29 NOTE — ED Notes (Signed)
Neurology via video at bedside

## 2020-08-29 NOTE — H&P (Addendum)
TRH H&P    Patient Demographics:    Caleb Powell, is a 50 y.o. male  MRN: [id number]  DOB - Jun 16, 1970  Admit Date - 08/28/2020  Referring MD/NP/PA: Ileene Patrick  Outpatient Primary MD for the patient is Leslie Andrea, MD  Patient coming from: Home   Chief complaint- vision loss   HPI:    Caleb Powell  is a 50 y.o. male, with medical history on file but known hypertension and work-up reported for thyroid disease, and nasopharyngeal scope for neck cancer- both negative per patient report. He had these workups done 2/2 throat pain. Patient does not smoke 2-1/2 packs a day for 30 years.  He presents the ED today with a chief complaint of vision loss in his right eye.  He reports that the last 1 lasted for 55 seconds.  He was speaking incoherently for 5 minutes afterwards.  He did have dizziness.  He also had a headache, that he has been having for several days.  Patient reports that his blood pressure was also very high.  Patient reports that his blood pressure at home is routinely over 200.  He does take hydrochlorothiazide and ramipril.  He refuses his Norvasc because somebody he knew had side effects from it.  Patient reports no weakness on one side more than the other.  Patient reports he had no trouble swallowing or walking.  No focal deficits on exam and my exam.  He was given Reglan and Benadryl for headache in the ED.  Telemetry neuro was consulted and recommended starting him on full dose aspirin.  They report that is difficult to discern between TIA or complicated migraine at this point.  They also agree the patient is not having any focal deficits at this time and only has a headache that is less intense than his arrival.  Patient reports an associated decrease in appetite.  He reports he is lost 13 pounds in 5 days.  He denies any fever or cough.  Patient has no other complaints at this time.  Patient smokes, does  not drink, does not use illicit drugs.  He is not vaccinated for COVID.  Patient is full code.  In the ED Temp 99.3, heart rate 94-1 01, respiratory rate 15-19, blood pressure initially 200s over 130, improving to 133/76, satting 92% Leukocytosis of 12.5, hemoglobin 16.0, platelets 270 Chemistry panel is unremarkable Tropes are 9, 5, 6 CT head shows no acute intracranial abnormality UA is negative Chest x-ray shows no acute disease EKG shows sinus tachycardia with a heart rate of 102, QTc 442 Patient was given Benadryl, Reglan, Zofran, normal saline 1 L Neuro consulted as above Labs and telemetry    Review of systems:    In addition to the HPI above,  No Fever-chills, Admits to headache, vision changes, no changes in hearing  no problems swallowing food or Liquids, Admits to intermittent chest tightness when his blood pressure is elevated, no cough or Shortness of Breath, No Abdominal pain, No Nausea or Vomiting, bowel movements are regular, No Blood in stool or  Urine, No dysuria, No new skin rashes or bruises, No new joints pains-aches,  No new weakness, tingling, numbness in any extremity, No recent weight gain or loss, No polyuria, polydypsia or polyphagia, No significant Mental Stressors.  All other systems reviewed and are negative.    Past History of the following :  History of tobacco abuse  History reviewed. No pertinent past medical history.    History reviewed. No pertinent surgical history.    Social History:      Social History   Tobacco Use   Smoking status: Every Day    Packs/day: 2.50    Years: 36.00    Pack years: 90.00    Types: Cigarettes   Smokeless tobacco: Former  Substance Use Topics   Alcohol use: No       Family History :     Family History  Problem Relation Age of Onset   Diabetes Mother    Heart disease Mother    Stroke Mother    Hypertension Maternal Grandmother    Hypertension Maternal Grandfather    Heart disease  Father       Home Medications:   Prior to Admission medications   Medication Sig Start Date End Date Taking? Authorizing Provider  hydrochlorothiazide (MICROZIDE) 12.5 MG capsule Take 12.5 mg by mouth daily. 08/13/20  Yes [provider]  LORazepam (ATIVAN) 0.5 MG tablet Take 0.5 mg by mouth See admin instructions. Take 1/2 in the morning and 1/2 every evening 08/13/20  Yes [provider]  omeprazole (PRILOSEC) 40 MG capsule Take 1 capsule (40 mg total) by mouth 2 (two) times daily. 04/17/20  Yes Soyla Dryer, PA-C  ramipril (ALTACE) 5 MG capsule Take 5 mg by mouth daily. 08/28/20  Yes [provider]  amLODipine (NORVASC) 5 MG tablet Take 1 tablet (5 mg total) by mouth daily. Patient not taking: No sig reported 04/17/20   Soyla Dryer, PA-C  fluvastatin (LESCOL) 20 MG capsule Take 1 capsule (20 mg total) by mouth at bedtime. Patient not taking: Reported on 08/28/2020 06/01/19   Soyla Dryer, PA-C  LORazepam (ATIVAN PO) Take 1 tablet by mouth daily. Patient not taking: Reported on 08/28/2020    [provider]  lovastatin (MEVACOR) 10 MG tablet Take 10 mg by mouth daily. Patient not taking: Reported on 08/28/2020 08/28/20   [provider]     Allergies:     Allergies  Allergen Reactions   Penicillins     Any "cillins"  Unknown allergy reaction.     Physical Exam:   Vitals  Blood pressure 133/76, pulse 94, temperature 99.3 F (37.4 C), resp. rate 17, height '5\' 9"'$  (1.753 m), weight 79.4 kg, SpO2 92 %.  1.  General: Patient lying left lateral decubitus in bed,  no acute distress   2. Psychiatric: Alert and oriented x 3, mood and behavior normal for situation, pleasant and cooperative with exam   3. Neurologic: Speech and language are normal, face is symmetric, moves all 4 extremities voluntarily, visual fields intact, at baseline without acute deficits on limited exam   4. HEENMT:  Head is atraumatic, normocephalic, pupils  reactive to light, neck is supple, trachea is midline, mucous membranes are moist   5. Respiratory : Mild wheezing present bilaterally, no rales or rhonchi, no cyanosis, clubbing present, no increase in work of breathing or accessory muscle use   6. Cardiovascular : Heart rate normal, rhythm is regular, no murmurs, rubs or gallops, no peripheral edema, peripheral pulses palpated  7. Gastrointestinal:  Abdomen is soft, nondistended, nontender to palpation bowel sounds active, no masses or organomegaly palpated   8. Skin:  Skin is warm, dry and intact without rashes, acute lesions, or ulcers on limited exam   9.Musculoskeletal:  No acute deformities or trauma, no asymmetry in tone, no peripheral edema, peripheral pulses palpated, no tenderness to palpation in the extremities     Data Review:    CBC Recent Labs  Lab 08/28/20 1859  WBC 12.5*  HGB 16.0  HCT 47.6  PLT 270  MCV 91.0  MCH 30.6  MCHC 33.6  RDW 11.9  LYMPHSABS 1.0  MONOABS 1.2*  EOSABS 0.1  BASOSABS 0.1   ------------------------------------------------------------------------------------------------------------------  Results for orders placed or performed during the hospital encounter of 08/28/20 (from the past 48 hour(s))  Comprehensive metabolic panel     Status: None   Collection Time: 08/28/20  6:59 PM  Result Value Ref Range   Sodium 136 135 - 145 mmol/L   Potassium 3.5 3.5 - 5.1 mmol/L   Chloride 101 98 - 111 mmol/L   CO2 26 22 - 32 mmol/L   Glucose, Bld 94 70 - 99 mg/dL    Comment: Glucose reference range applies only to samples taken after fasting for at least 8 hours.   BUN 16 6 - 20 mg/dL   Creatinine, Ser 0.98 0.61 - 1.24 mg/dL   Calcium 9.1 8.9 - 10.3 mg/dL   Total Protein 7.9 6.5 - 8.1 g/dL   Albumin 4.8 3.5 - 5.0 g/dL   AST 15 15 - 41 U/L   ALT 18 0 - 44 U/L   Alkaline Phosphatase 81 38 - 126 U/L   Total Bilirubin 0.5 0.3 - 1.2 mg/dL   GFR, Estimated >60 >60 mL/min    Comment:  (NOTE) Calculated using the CKD-EPI Creatinine Equation (2021)    Anion gap 9 5 - 15    Comment: Performed at [facility], [address] [place], [place] [address]  CBC with Differential     Status: Abnormal   Collection Time: 08/28/20  6:59 PM  Result Value Ref Range   WBC 12.5 (H) 4.0 - 10.5 K/uL   RBC 5.23 4.22 - 5.81 MIL/uL   Hemoglobin 16.0 13.0 - 17.0 g/dL   HCT 47.6 39.0 - 52.0 %   MCV 91.0 80.0 - 100.0 fL   MCH 30.6 26.0 - 34.0 pg   MCHC 33.6 30.0 - 36.0 g/dL   RDW 11.9 11.5 - 15.5 %   Platelets 270 150 - 400 K/uL   nRBC 0.0 0.0 - 0.2 %   Neutrophils Relative % 79 %   Neutro Abs 10.0 (H) 1.7 - 7.7 K/uL   Lymphocytes Relative 8 %   Lymphs Abs 1.0 0.7 - 4.0 K/uL   Monocytes Relative 10 %   Monocytes Absolute 1.2 (H) 0.1 - 1.0 K/uL   Eosinophils Relative 1 %   Eosinophils Absolute 0.1 0.0 - 0.5 K/uL   Basophils Relative 1 %   Basophils Absolute 0.1 0.0 - 0.1 K/uL   Immature Granulocytes 1 %   Abs Immature Granulocytes 0.09 (H) 0.00 - 0.07 K/uL    Comment: Performed at [facility], [address] [place], [place] [address]  Protime-INR     Status: None   Collection Time: 08/28/20  6:59 PM  Result Value Ref Range   Prothrombin Time 13.9 11.4 - 15.2 seconds   INR 1.1 0.8 - 1.2    Comment: (NOTE) INR goal varies based  on device and disease states. Performed at [facility], [address] [place], [place] [address]   Troponin I (High Sensitivity)     Status: None   Collection Time: 08/28/20  6:59 PM  Result Value Ref Range   Troponin I (High Sensitivity) 5 <18 ng/L    Comment: (NOTE) Elevated high sensitivity troponin I (hsTnI) values and significant  changes across serial measurements may suggest ACS but many other  chronic and acute conditions are known to elevate hsTnI results.  Refer to the "Links" section for chest pain algorithms and additional  guidance. Performed at [facility], [address] [place], [place] [address]   Troponin I (High Sensitivity)      Status: None   Collection Time: 08/28/20  8:55 PM  Result Value Ref Range   Troponin I (High Sensitivity) 6 <18 ng/L    Comment: (NOTE) Elevated high sensitivity troponin I (hsTnI) values and significant  changes across serial measurements may suggest ACS but many other  chronic and acute conditions are known to elevate hsTnI results.  Refer to the "Links" section for chest pain algorithms and additional  guidance. Performed at [facility], [address] [place], [place] [address]   Urinalysis, Routine w reflex microscopic Urine, Clean Catch     Status: Abnormal   Collection Time: 08/28/20  9:09 PM  Result Value Ref Range   Color, Urine STRAW (A) YELLOW   APPearance CLEAR CLEAR   Specific Gravity, Urine 1.005 1.005 - 1.030   pH 6.0 5.0 - 8.0   Glucose, UA NEGATIVE NEGATIVE mg/dL   Hgb urine dipstick SMALL (A) NEGATIVE   Bilirubin Urine NEGATIVE NEGATIVE   Ketones, ur NEGATIVE NEGATIVE mg/dL   Protein, ur NEGATIVE NEGATIVE mg/dL   Nitrite NEGATIVE NEGATIVE   Leukocytes,Ua NEGATIVE NEGATIVE   RBC / HPF 0-5 0 - 5 RBC/hpf   WBC, UA 0-5 0 - 5 WBC/hpf   Bacteria, UA NONE SEEN NONE SEEN   Mucus PRESENT     Comment: Performed at [facility], [address], [place], [place] [address]    Chemistries  Recent Labs  Lab 08/28/20 1859  NA 136  K 3.5  CL 101  CO2 26  GLUCOSE 94  BUN 16  CREATININE 0.98  CALCIUM 9.1  AST 15  ALT 18  ALKPHOS 81  BILITOT 0.5   ------------------------------------------------------------------------------------------------------------------  ------------------------------------------------------------------------------------------------------------------ GFR: Estimated Creatinine Clearance: 88.2 mL/min (by C-G formula based on SCr of 0.98 mg/dL). Liver Function Tests: Recent Labs  Lab 08/28/20 1859  AST 15  ALT 18  ALKPHOS 81  BILITOT 0.5  PROT 7.9  ALBUMIN 4.8   No results for input(s): LIPASE, AMYLASE in the last 168 hours. No  results for input(s): AMMONIA in the last 168 hours. Coagulation Profile: Recent Labs  Lab 08/28/20 1859  INR 1.1   Cardiac Enzymes: No results for input(s): CKTOTAL, CKMB, CKMBINDEX, TROPONINI in the last 168 hours. BNP (last 3 results) No results for input(s): PROBNP in the last 8760 hours. HbA1C: No results for input(s): HGBA1C in the last 72 hours. CBG: No results for input(s): GLUCAP in the last 168 hours. Lipid Profile: No results for input(s): CHOL, HDL, LDLCALC, TRIG, CHOLHDL, LDLDIRECT in the last 72 hours. Thyroid Function Tests: No results for input(s): TSH, T4TOTAL, FREET4, T3FREE, THYROIDAB in the last 72 hours. Anemia Panel: No results for input(s): VITAMINB12, FOLATE, FERRITIN, TIBC, IRON, RETICCTPCT in the last 72 hours.  --------------------------------------------------------------------------------------------------------------- Urine analysis:    Component Value Date/Time  COLORURINE STRAW (A) 08/28/2020 2109   APPEARANCEUR CLEAR 08/28/2020 2109   LABSPEC 1.005 08/28/2020 2109   PHURINE 6.0 08/28/2020 2109   GLUCOSEU NEGATIVE 08/28/2020 2109   HGBUR SMALL (A) 08/28/2020 2109   BILIRUBINUR NEGATIVE 08/28/2020 2109   KETONESUR NEGATIVE 08/28/2020 2109   PROTEINUR NEGATIVE 08/28/2020 2109   NITRITE NEGATIVE 08/28/2020 2109   LEUKOCYTESUR NEGATIVE 08/28/2020 2109      Imaging Results:    CT HEAD WO CONTRAST (5MM)  Result Date: 08/28/2020 CLINICAL DATA:  TIA, headache, vision loss in right eye EXAM: CT HEAD WITHOUT CONTRAST TECHNIQUE: Contiguous axial images were obtained from the base of the skull through the vertex without intravenous contrast. COMPARISON:  None. FINDINGS: Brain: No acute intracranial abnormality. Specifically, no hemorrhage, hydrocephalus, mass lesion, acute infarction, or significant intracranial injury. Vascular: No hyperdense vessel or unexpected calcification. Skull: No acute calvarial abnormality. Sinuses/Orbits: No acute findings  Other: None IMPRESSION: No acute intracranial abnormality. Electronically Signed   By: Rolm Baptise M.D.   On: 08/28/2020 19:32   DG Chest Port 1 View  Result Date: 08/28/2020 CLINICAL DATA:  Chest pain, dyspnea EXAM: PORTABLE CHEST 1 VIEW COMPARISON:  None. FINDINGS: Lungs are clear. No pneumothorax or pleural effusion. Cardiac size within normal limits. Pulmonary vascularity is normal. Healed left mid clavicular fracture noted. No acute bone abnormality IMPRESSION: No active disease. Electronically Signed   By: Fidela Salisbury M.D.   On: 08/28/2020 19:28       Assessment & Plan:    Active Problems:   Tobacco abuse   TIA (transient ischemic attack)   Hypertensive crisis   Leukocytosis   Chest pain   TIA Short-term vision loss in the right eye Migraine, hypertensive emergency also on the differential Blood pressure was as high as 200s over 100s Restart home Norvasc, hydrochlorothiazide, ramipril -patient has been noncompliant with his Norvasc CT head is normal MRI brain, MRA head and neck in the a.m. Telemetry neuro recommends full stroke work-up and full dose aspirin -ordered Continue to monitor Hypertensive crisis Markedly elevated blood pressures Hydralazine as needed Home antihypertensives Check TSH Check renal ultrasound Check UDS Patient does not experience jittery or adrenaline like feeling with the spikes in blood pressure Continue to monitor Tobacco abuse Advised on the importance of cessation Patient is a 2-1/2 pack/day smoker for 30 years Is try to cut back and is now a 1-1/2 pack/day smoker Will give nicotine patch if needed Leukocytosis Likely acute phase reactant to above processes Monitoring the a.m. No infectious symptoms Chest pain Secondary to hypertensive crisis most likely Normal troponin Continue to monitor   DVT Prophylaxis-   Heparin- SCDs   AM Labs Ordered, also please review Full Orders  Family Communication: Admission, patients condition  and plan of care including tests being ordered have been discussed with the patient and daughter who indicate understanding and agree with the plan and Code Status.  Code Status: Full  Admission status: Observation Time spent in minutes : [place]

## 2020-08-30 ENCOUNTER — Inpatient Hospital Stay (HOSPITAL_COMMUNITY)
Admission: EM | Admit: 2020-08-30 | Discharge: 2020-08-31 | DRG: 034 | Disposition: A | Discharge status: Home / Self Care | Payer: Medicaid Other | Attending: Critical Care Medicine | Admitting: Critical Care Medicine

## 2020-08-30 ENCOUNTER — Inpatient Hospital Stay (HOSPITAL_COMMUNITY): Payer: Medicaid Other | Admitting: Certified Registered Nurse Anesthetist

## 2020-08-30 ENCOUNTER — Other Ambulatory Visit: Payer: Self-pay

## 2020-08-30 ENCOUNTER — Encounter (HOSPITAL_COMMUNITY): Payer: Self-pay

## 2020-08-30 ENCOUNTER — Inpatient Hospital Stay (HOSPITAL_COMMUNITY): Payer: Medicaid Other

## 2020-08-30 ENCOUNTER — Encounter (HOSPITAL_COMMUNITY)
Admission: EM | Disposition: A | Discharge status: Home / Self Care | Payer: Self-pay | Source: Home / Self Care | Attending: Pulmonary Disease

## 2020-08-30 DIAGNOSIS — Z880 Allergy status to penicillin: Secondary | ICD-10-CM | POA: Diagnosis not present

## 2020-08-30 DIAGNOSIS — I6529 Occlusion and stenosis of unspecified carotid artery: Secondary | ICD-10-CM | POA: Diagnosis not present

## 2020-08-30 DIAGNOSIS — U071 COVID-19: Secondary | ICD-10-CM | POA: Diagnosis not present

## 2020-08-30 DIAGNOSIS — G453 Amaurosis fugax: Secondary | ICD-10-CM | POA: Diagnosis not present

## 2020-08-30 DIAGNOSIS — F17210 Nicotine dependence, cigarettes, uncomplicated: Secondary | ICD-10-CM | POA: Diagnosis not present

## 2020-08-30 DIAGNOSIS — Z823 Family history of stroke: Secondary | ICD-10-CM | POA: Diagnosis not present

## 2020-08-30 DIAGNOSIS — E785 Hyperlipidemia, unspecified: Secondary | ICD-10-CM | POA: Diagnosis not present

## 2020-08-30 DIAGNOSIS — I10 Essential (primary) hypertension: Secondary | ICD-10-CM | POA: Diagnosis not present

## 2020-08-30 DIAGNOSIS — G459 Transient cerebral ischemic attack, unspecified: Secondary | ICD-10-CM | POA: Diagnosis present

## 2020-08-30 DIAGNOSIS — I6503 Occlusion and stenosis of bilateral vertebral arteries: Secondary | ICD-10-CM | POA: Diagnosis not present

## 2020-08-30 DIAGNOSIS — I6521 Occlusion and stenosis of right carotid artery: Principal | ICD-10-CM | POA: Diagnosis present

## 2020-08-30 DIAGNOSIS — Z8249 Family history of ischemic heart disease and other diseases of the circulatory system: Secondary | ICD-10-CM | POA: Diagnosis not present

## 2020-08-30 DIAGNOSIS — Z833 Family history of diabetes mellitus: Secondary | ICD-10-CM | POA: Diagnosis not present

## 2020-08-30 DIAGNOSIS — F419 Anxiety disorder, unspecified: Secondary | ICD-10-CM | POA: Diagnosis not present

## 2020-08-30 HISTORY — PX: IR US GUIDE VASC ACCESS RIGHT: IMG2390

## 2020-08-30 HISTORY — PX: RADIOLOGY WITH ANESTHESIA: SHX6223

## 2020-08-30 HISTORY — DX: Essential (primary) hypertension: I10

## 2020-08-30 HISTORY — PX: IR INTRAVSC STENT CERV CAROTID W/EMB-PROT MOD SED INCL ANGIO: IMG2303

## 2020-08-30 HISTORY — PX: IR ANGIO VERTEBRAL SEL VERTEBRAL BILAT MOD SED: IMG5369

## 2020-08-30 HISTORY — PX: IR ANGIO INTRA EXTRACRAN SEL INTERNAL CAROTID BILAT MOD SED: IMG5363

## 2020-08-30 LAB — CBC
HCT: 46.8 % (ref 39.0–52.0)
Hemoglobin: 15.4 g/dL (ref 13.0–17.0)
MCH: 30.2 pg (ref 26.0–34.0)
MCHC: 32.9 g/dL (ref 30.0–36.0)
MCV: 91.8 fL (ref 80.0–100.0)
Platelets: 226 10*3/uL (ref 150–400)
RBC: 5.1 MIL/uL (ref 4.22–5.81)
RDW: 12.1 % (ref 11.5–15.5)
WBC: 5.9 10*3/uL (ref 4.0–10.5)
nRBC: 0 % (ref 0.0–0.2)

## 2020-08-30 LAB — URINALYSIS, ROUTINE W REFLEX MICROSCOPIC
Bacteria, UA: NONE SEEN
Bilirubin Urine: NEGATIVE
Glucose, UA: NEGATIVE mg/dL
Ketones, ur: 5 mg/dL — AB
Leukocytes,Ua: NEGATIVE
Nitrite: NEGATIVE
Protein, ur: NEGATIVE mg/dL
Specific Gravity, Urine: 1.009 (ref 1.005–1.030)
pH: 6 (ref 5.0–8.0)

## 2020-08-30 LAB — PROCALCITONIN: Procalcitonin: <0.1 ng/mL

## 2020-08-30 LAB — CORTISOL: Cortisol, Plasma: 13.5 ug/dL

## 2020-08-30 LAB — BASIC METABOLIC PANEL
Anion gap: 9 (ref 5–15)
BUN: 17 mg/dL (ref 6–20)
CO2: 25 mmol/L (ref 22–32)
Calcium: 8.7 mg/dL — ABNORMAL LOW (ref 8.9–10.3)
Chloride: 103 mmol/L (ref 98–111)
Creatinine, Ser: 1.04 mg/dL (ref 0.61–1.24)
GFR, Estimated: >60 mL/min (ref >60)
Glucose, Bld: 94 mg/dL (ref 70–99)
Potassium: 3.4 mmol/L — ABNORMAL LOW (ref 3.5–5.1)
Sodium: 137 mmol/L (ref 135–145)

## 2020-08-30 LAB — PROTIME-INR
INR: 1 (ref 0.8–1.2)
Prothrombin Time: 13.4 seconds (ref 11.4–15.2)

## 2020-08-30 LAB — APTT: aPTT: 33 seconds (ref 24–36)

## 2020-08-30 LAB — CBG MONITORING, ED: Glucose-Capillary: 96 mg/dL (ref 70–99)

## 2020-08-30 LAB — AMYLASE: Amylase: 60 U/L (ref 28–100)

## 2020-08-30 LAB — SURGICAL PCR SCREEN
MRSA, PCR: NEGATIVE
Staphylococcus aureus: NEGATIVE

## 2020-08-30 LAB — GLUCOSE, CAPILLARY: Glucose-Capillary: 96 mg/dL (ref 70–99)

## 2020-08-30 LAB — STREP PNEUMONIAE URINARY ANTIGEN: Strep Pneumo Urinary Antigen: NEGATIVE

## 2020-08-30 LAB — LIPASE, BLOOD: Lipase: 45 U/L (ref 11–51)

## 2020-08-30 IMAGING — XA IR CAROTID INTERNAL HEAD/NECK BILAT  (MS)
11 of 20 series · 11 of 24 positions shown · IV contrast (IODINE)
Comparison: MR angiogram of the head and neck [DATE]

INDICATION: 50-year-old male with past medical history anxiety, hypertension and
tobacco use who presented to [HOSPITAL] after episode of
amaurosis fugax on [DATE]. He underwent an MR angiogram of the
head and neck that showed severe stenosis of the cervical right ICA
as well as occlusion of the left vertebral. He left the hospital AMA
on the same day and presented today to [REDACTED] for follow-up
imaging findings. Given his symptomatic severe right carotid
stenosis, he was loaded on dual anti-platelet with aspirin and
ticagrelor in anticipation to diagnostic cerebral angiogram and
right carotid angioplasty and stenting.

EXAM:
ULTRASOUND-GUIDED VASCULAR [REDACTED] CEREBRAL ANGIOGRAM
RIGHT CAROTID ANGIOPLASTY AND STENTING WITH CEREBRAL PROTECTION
DEVICE
TECHNIQUE: Informed written consent was obtained from the patient after a
thorough discussion of the procedural risks, benefits and
alternatives. All questions were addressed.

[Series 1: ir intravsc stent cerv · 1 of 1 slices shown]
[im 1/1]
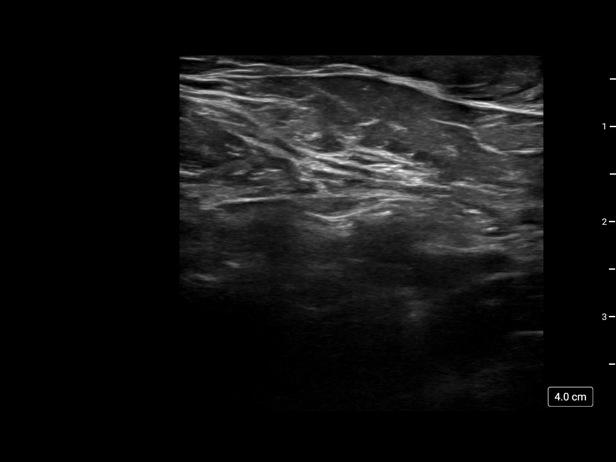

[Series 3: cerebral care 2 · 2 acquisitions, 1 frame shown (1 of 7)]
[im 1/2]
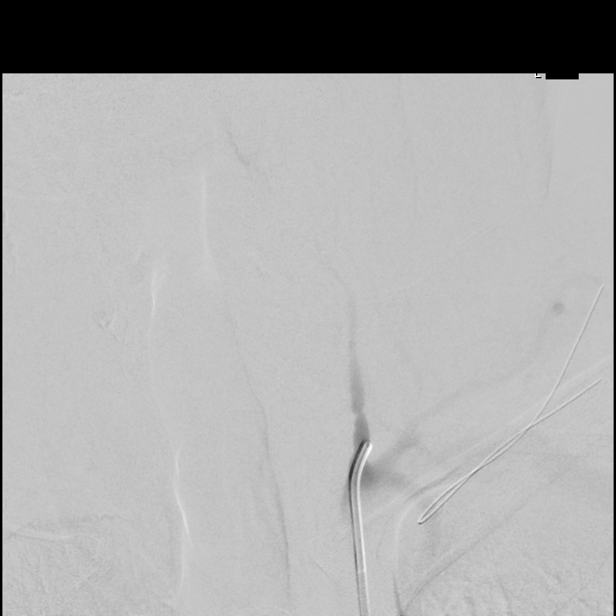

[Series 4: cerebral care 2 · 2 acquisitions, 1 frame shown (2 of 7)]
[im 1/2]
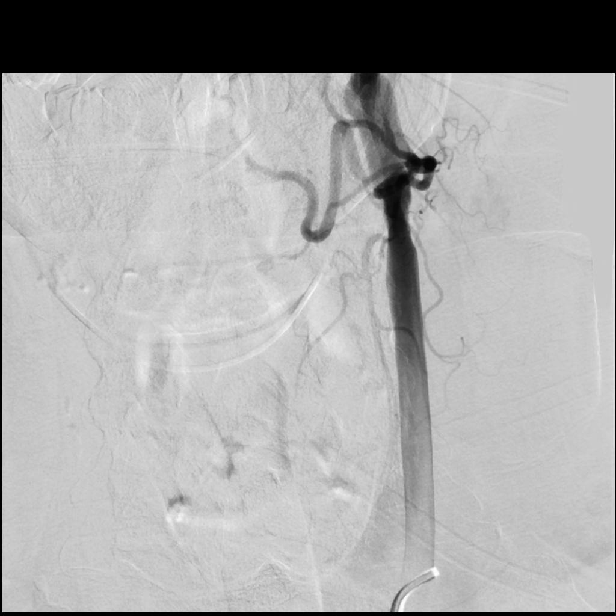

[Series 6: cerebral care 2 · 1 of 10 frames shown (3 of 7)]
[frame 2/10]
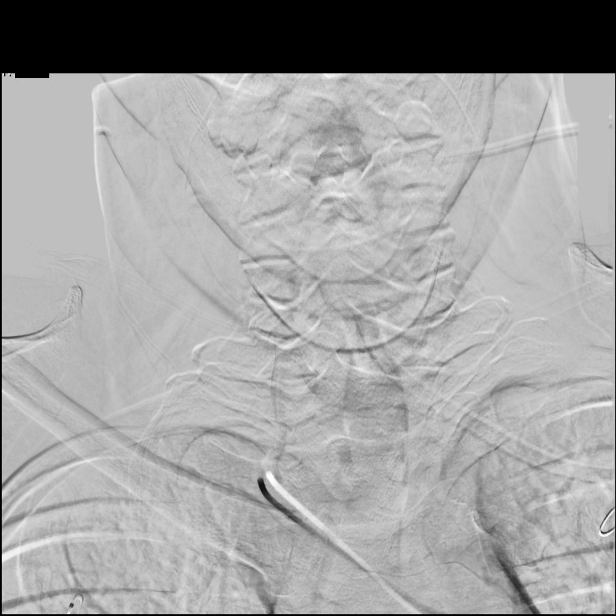

[Series 7: cerebral care 2 · 2 acquisitions, 1 frame shown (4 of 7)]
[im 1/2]
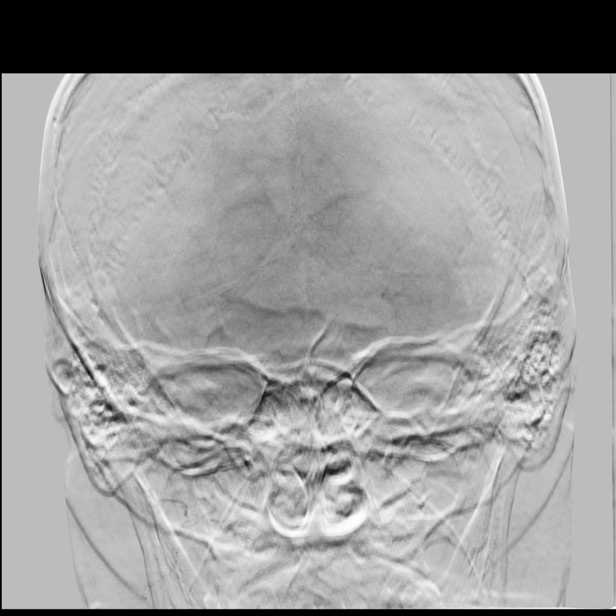

[Series 10: cerebral care 2 · 2 acquisitions, 1 frame shown (5 of 7)]
[im 1/2]
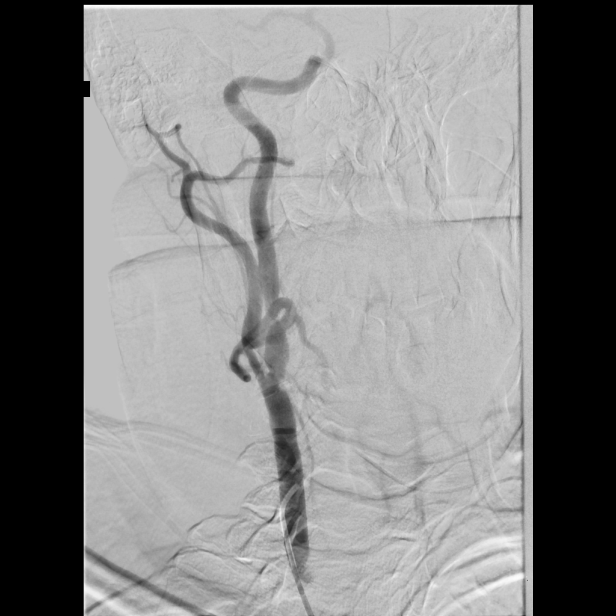

[Series 12: single · 1 of 2 slices shown (1 of 2)]
[im 1/2]
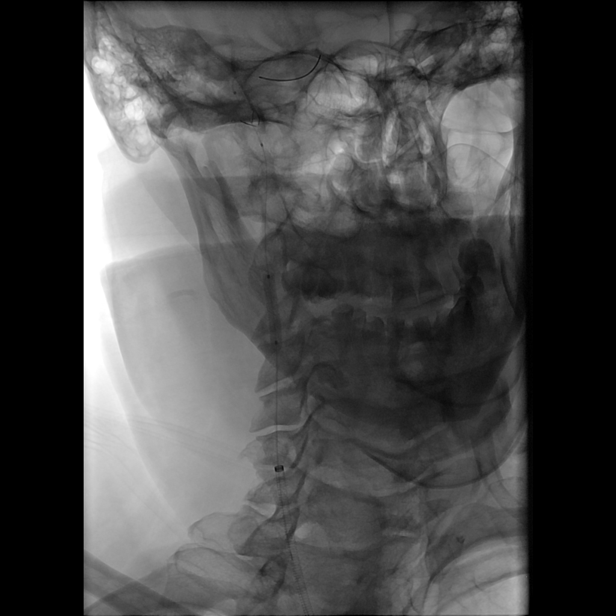

[Series 14: single · 1 of 2 slices shown (2 of 2)]
[im 1/2]
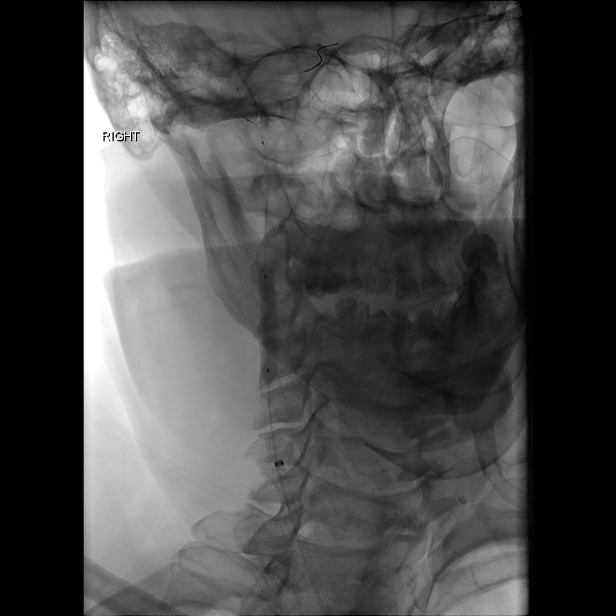

[Series 16: cerebral care 2 · 2 acquisitions, 1 frame shown (6 of 7)]
[im 1/2]
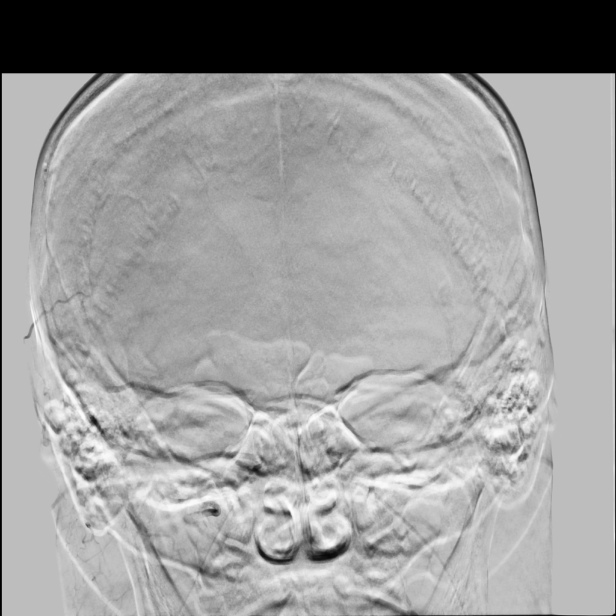

[Series 18: cerebral care 2 · 1 of 9 frames shown (7 of 7)]
[frame 2/9]
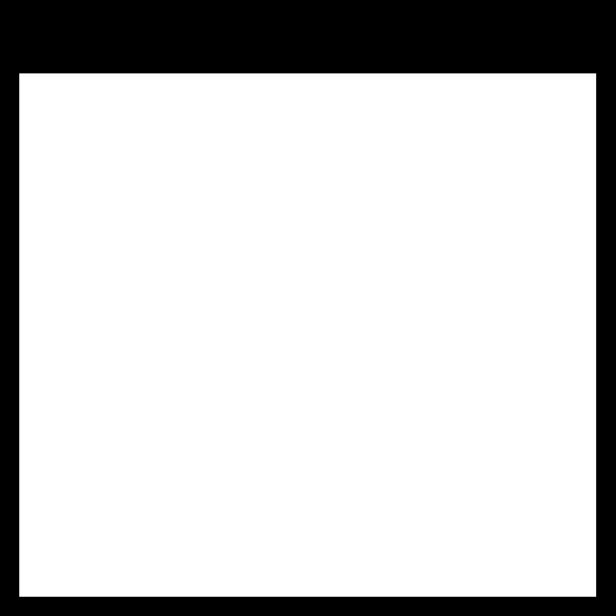

[Series 300: ir angio intra extracran sel com carotid · 1 of 77 slices shown]
[im 13/77]
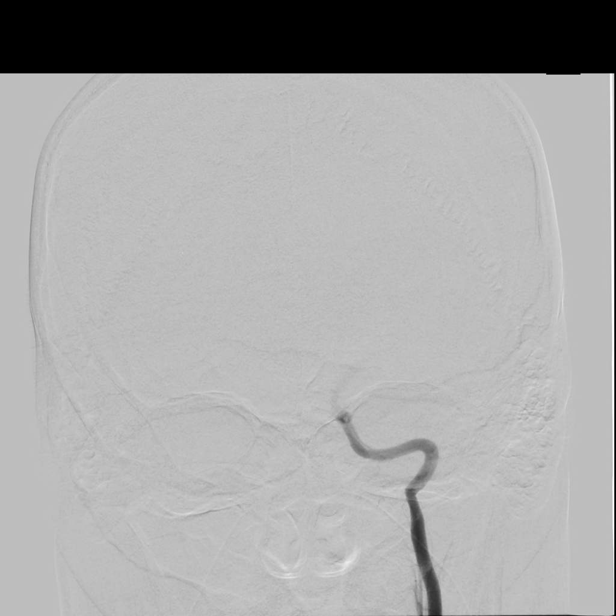

[11 of 24 positions shown; findings below may reference images not displayed]

MEDICATIONS:
[XW] units of heparin IV

ANESTHESIA/SEDATION:
The procedure was performed under monitored anesthesia care.

CONTRAST:  125 mL of Omnipaque 240 milligram/mL

FLUOROSCOPY TIME:  Fluoroscopy Time: 15 minutes 21 seconds (469
mGy).

COMPLICATIONS:
None immediate.
Maximal Sterile Barrier Technique was utilized including caps, mask,
sterile gowns, sterile gloves, sterile drape, hand hygiene and skin
antiseptic. A timeout was performed prior to the initiation of the
procedure.

The right groin was prepped and draped in the usual sterile fashion.
Using a micropuncture kit and the modified Seldinger technique,
access was gained to the right common femoral artery and a 5 French
sheath was placed. Real-time ultrasound guidance was utilized for
vascular access including the acquisition of a permanent ultrasound
image documenting patency of the accessed vessel.

Under fluoroscopy, a 5 JONATAN ALEXANDER 2 catheter was navigated
over a 0.035" Terumo Glidewire into the aortic arch. The catheter
was placed into the left subclavian artery. Frontal angiogram of the
neck was obtained. The catheter was then advanced into the left
vertebral artery. Frontal and lateral angiograms of the neck were
obtained.

Next, the catheter was placed into the left common carotid artery.
Frontal and lateral angiograms neck obtained. Under biplane roadmap,
the catheter was advanced into the left internal carotid artery.
Frontal and lateral angiograms of the head obtained.

The catheter was subsequently navigated into the right subclavian
artery. Frontal angiogram of the neck was obtained. On the roadmap
guidance, the catheter was advanced into the right vertebral artery.
Frontal and lateral angiogram of the head were obtained.

The catheter was then placed into the right common carotid artery.
Frontal and lateral angiograms of the neck were obtained followed by
frontal and lateral angiograms of the head.
FINDINGS: 1. The right common femoral artery has adequate caliber for vascular
access.
2. Diffuse luminal irregularity of the cervical left vertebral
artery with multifocal areas of stenosis at V1 and proximal V2
segment and occlusion of the mid V2 segment at the level of the V4
vertebral body. Reconstitution at the level of the C3 vertebral body
from ascending cervical branches.
3. Mild atherosclerotic changes of the left carotid bifurcation
without hemodynamically significant stenosis.
4. Brisk contrast opacification of the left MCA vascular tree on
left internal carotid artery angiograms noting severely hypoplastic
left A1/ACA segment.
5. Normal course and caliber of the cervical right vertebral artery.
Cranial angiograms showed brisk opacification of the right vertebral
artery, basilar artery and bilateral posterior cerebral arteries as
well as distal cervical segment of the left artery and left PICA via
contrast reflux mild luminal irregularity of the basilar artery and
bilateral distal P2/PCA segments without hemodynamically significant
stenosis.
6. Calcified plaque at the right carotid bifurcation resulting in
approximately 40% stenosis. However, approximately 80% stenosis is
seen at the cervical right ICA just distal to the bulb.
7. Contrast opacification of the right MCA and bilateral ACA
vascular tree is seen on right common carotid artery angiograms.

PROCEDURE:
Frontal, lateral and bilateral oblique angiograms of the neck were
obtained. The JONATAN ALEXANDER 2 catheter and the 5 French femoral sheath
were exchanged over a 0.035 inch Terumo Glidewire for a 6 French
shuttle sheath which was placed in the distal right common carotid
artery.

Frontal and lateral angiograms of the neck were obtained. Under
biplane roadmap, a 4-7 mm Emboshield nav 6 cerebral protection
device was navigated into the distal cervical segment of the right
ICA. Then, a 4 x 20 mm Viatrac balloon was navigated into the right
ICA, at the level of occlusion. Angioplasty was performed under
fluoroscopy. The balloon was removed and a 6-8 x 40 mm Xact carotid
stent was deployed spanning the distal right common carotid artery
and proximal right ICA, across both the 40% and 80% stenoses.
Subsequently, a 5 x 30 mm Viatrac balloon was navigated into the
recently deployed stent. In stent angioplasty was performed under
fluoroscopy. The balloon was removed and the cerebral protection
device was recaptured.

Right common carotid artery angiograms with frontal and lateral
views of the neck showed excellent anterograde flow with resolution
of stenosis.

Right internal carotid artery angiograms with frontal and lateral
views of the head showed no evidence of thromboembolic complication.

Delay angiograms of the right common carotid artery with frontal and
lateral views of the neck showed no evidence of in stent clot
formation.

The shuttle sheath was then retracted into the right external iliac
artery. Angiogram was performed in right anterior oblique view. The
access is at the level of the right common femoral artery which has
adequate caliber for closure device. The shuttle sheath was
exchanged over the wire for an 8 French Angio-Seal which was
utilized for access closure. Immediate hemostasis was achieved.
IMPRESSION: 1. Successful and uncomplicated angioplasty and stenting of a
symptomatic severe (80%) stenosis of the cervical right ICA.
2. Occlusion of the cervical left vertebral artery at the mid V2
segment with reconstitution of flow via ascending cervical artery.
3. Normal caliber of the cervical and intracranial right vertebral
artery.
4. No hemodynamically significant stenosis of the left carotid
bifurcation.

PLAN:
1. Continue on dual anti-platelet therapy with ticagrelor and
aspirin.
2. Follow-up carotid duplex in 3 months.

## 2020-08-30 SURGERY — IR WITH ANESTHESIA
Anesthesia: Monitor Anesthesia Care

## 2020-08-30 MED ORDER — LACTATED RINGERS IV SOLN: INTRAVENOUS | Status: DC | PRN

## 2020-08-30 MED ORDER — FENTANYL CITRATE (PF) 250 MCG/5ML IJ SOLN
INTRAMUSCULAR | Status: DC | PRN
  Administered 2020-08-30 (×2): 25 ug via INTRAVENOUS

## 2020-08-30 MED ORDER — LORAZEPAM 0.5 MG PO TABS
0.5000 mg | ORAL_TABLET | Freq: Two times a day (BID) | ORAL | Status: DC
  Administered 2020-08-30 – 2020-08-31 (×2): 0.5 mg via ORAL
  Filled 2020-08-30 (×2): qty 1

## 2020-08-30 MED ORDER — DIPHENHYDRAMINE HCL 50 MG/ML IJ SOLN: 50.0000 mg | Freq: Once | INTRAMUSCULAR | Status: DC | PRN

## 2020-08-30 MED ORDER — PROPOFOL 10 MG/ML IV BOLUS
INTRAVENOUS | Status: DC | PRN
  Administered 2020-08-30 (×2): 10 mg via INTRAVENOUS

## 2020-08-30 MED ORDER — PANTOPRAZOLE SODIUM 40 MG IV SOLR
40.0000 mg | Freq: Every day | INTRAVENOUS | Status: DC
  Administered 2020-08-30: 40 mg via INTRAVENOUS
  Filled 2020-08-30: qty 40

## 2020-08-30 MED ORDER — SODIUM CHLORIDE 0.9 % IV SOLN: INTRAVENOUS | Status: DC | PRN

## 2020-08-30 MED ORDER — PHENYLEPHRINE HCL-NACL 20-0.9 MG/250ML-% IV SOLN: 0.0000 ug/min | INTRAVENOUS | Status: DC

## 2020-08-30 MED ORDER — PROPOFOL 500 MG/50ML IV EMUL
INTRAVENOUS | Status: DC | PRN
  Administered 2020-08-30: 50 ug/kg/min via INTRAVENOUS

## 2020-08-30 MED ORDER — BEBTELOVIMAB 175 MG/2 ML IV (EUA)
175.0000 mg | Freq: Once | INTRAMUSCULAR | Status: AC
  Administered 2020-08-30: 175 mg via INTRAVENOUS
  Filled 2020-08-30: qty 2

## 2020-08-30 MED ORDER — PHENYLEPHRINE 40 MCG/ML (10ML) SYRINGE FOR IV PUSH (FOR BLOOD PRESSURE SUPPORT)
PREFILLED_SYRINGE | INTRAVENOUS | Status: DC | PRN
  Administered 2020-08-30: 80 ug via INTRAVENOUS
  Administered 2020-08-30: 40 ug via INTRAVENOUS
  Administered 2020-08-30: 80 ug via INTRAVENOUS
  Administered 2020-08-30 (×4): 40 ug via INTRAVENOUS

## 2020-08-30 MED ORDER — LIDOCAINE 2% (20 MG/ML) 5 ML SYRINGE
INTRAMUSCULAR | Status: DC | PRN
  Administered 2020-08-30: 40 mg via INTRAVENOUS

## 2020-08-30 MED ORDER — HYDROCHLOROTHIAZIDE 12.5 MG PO CAPS: 12.5000 mg | ORAL_CAPSULE | Freq: Every day | ORAL | Status: DC

## 2020-08-30 MED ORDER — ASPIRIN 81 MG PO CHEW
81.0000 mg | CHEWABLE_TABLET | Freq: Every day | ORAL | Status: DC
  Administered 2020-08-30 – 2020-08-31 (×2): 81 mg via ORAL
  Filled 2020-08-30: qty 1

## 2020-08-30 MED ORDER — DOCUSATE SODIUM 100 MG PO CAPS: 100.0000 mg | ORAL_CAPSULE | Freq: Two times a day (BID) | ORAL | Status: DC | PRN

## 2020-08-30 MED ORDER — FENTANYL CITRATE (PF) 250 MCG/5ML IJ SOLN
INTRAMUSCULAR | Status: AC
  Filled 2020-08-30: qty 5

## 2020-08-30 MED ORDER — ASPIRIN 81 MG PO CHEW: 81.0000 mg | CHEWABLE_TABLET | Freq: Every day | ORAL | Status: DC

## 2020-08-30 MED ORDER — TICAGRELOR 90 MG PO TABS: 90.0000 mg | ORAL_TABLET | Freq: Two times a day (BID) | ORAL | Status: DC

## 2020-08-30 MED ORDER — EPINEPHRINE 0.3 MG/0.3ML IJ SOAJ
0.3000 mg | Freq: Once | INTRAMUSCULAR | Status: DC | PRN
  Filled 2020-08-30 (×2): qty 0.6

## 2020-08-30 MED ORDER — ACETAMINOPHEN 650 MG RE SUPP: 650.0000 mg | RECTAL | Status: DC | PRN

## 2020-08-30 MED ORDER — CLEVIDIPINE BUTYRATE 0.5 MG/ML IV EMUL: 0.0000 mg/h | INTRAVENOUS | Status: DC

## 2020-08-30 MED ORDER — FAMOTIDINE IN NACL 20-0.9 MG/50ML-% IV SOLN
20.0000 mg | Freq: Once | INTRAVENOUS | Status: DC | PRN
  Filled 2020-08-30: qty 50

## 2020-08-30 MED ORDER — IOHEXOL 240 MG/ML SOLN
50.0000 mL | Freq: Once | INTRAMUSCULAR | Status: AC
  Administered 2020-08-30: 125 mL via INTRAVENOUS

## 2020-08-30 MED ORDER — POLYETHYLENE GLYCOL 3350 17 G PO PACK: 17.0000 g | PACK | Freq: Every day | ORAL | Status: DC | PRN

## 2020-08-30 MED ORDER — PROMETHAZINE HCL 25 MG/ML IJ SOLN
6.2500 mg | INTRAMUSCULAR | Status: DC | PRN
  Filled 2020-08-30: qty 1

## 2020-08-30 MED ORDER — RAMIPRIL 5 MG PO CAPS
5.0000 mg | ORAL_CAPSULE | Freq: Every day | ORAL | Status: DC
  Filled 2020-08-30 (×2): qty 1

## 2020-08-30 MED ORDER — ONDANSETRON HCL 4 MG/2ML IJ SOLN: 4.0000 mg | Freq: Four times a day (QID) | INTRAMUSCULAR | Status: DC | PRN

## 2020-08-30 MED ORDER — AMLODIPINE BESYLATE 5 MG PO TABS: 5.0000 mg | ORAL_TABLET | Freq: Every day | ORAL | Status: DC

## 2020-08-30 MED ORDER — METHYLPREDNISOLONE SODIUM SUCC 125 MG IJ SOLR: 125.0000 mg | Freq: Once | INTRAMUSCULAR | Status: DC | PRN

## 2020-08-30 MED ORDER — HEPARIN SODIUM (PORCINE) 1000 UNIT/ML IJ SOLN
INTRAMUSCULAR | Status: DC | PRN
  Administered 2020-08-30: 5000 [IU] via INTRAVENOUS

## 2020-08-30 MED ORDER — PHENYLEPHRINE HCL-NACL 20-0.9 MG/250ML-% IV SOLN
INTRAVENOUS | Status: DC | PRN
  Administered 2020-08-30: 30 ug/min via INTRAVENOUS

## 2020-08-30 MED ORDER — SODIUM CHLORIDE 0.9 % IV SOLN
250.0000 mL | INTRAVENOUS | Status: DC
  Administered 2020-08-30: 250 mL via INTRAVENOUS

## 2020-08-30 MED ORDER — LIDOCAINE HCL (PF) 1 % IJ SOLN
INTRAMUSCULAR | Status: AC | PRN
Start: 2020-08-30 — End: 2020-08-30
  Administered 2020-08-30: 10 mL

## 2020-08-30 MED ORDER — CHLORHEXIDINE GLUCONATE CLOTH 2 % EX PADS: 6.0000 | MEDICATED_PAD | Freq: Every day | CUTANEOUS | Status: DC

## 2020-08-30 MED ORDER — ASPIRIN 81 MG PO CHEW
81.0000 mg | CHEWABLE_TABLET | Freq: Every day | ORAL | Status: DC
  Filled 2020-08-30: qty 1

## 2020-08-30 MED ORDER — TICAGRELOR 90 MG PO TABS
180.0000 mg | ORAL_TABLET | Freq: Once | ORAL | Status: AC
  Administered 2020-08-30: 180 mg via ORAL
  Filled 2020-08-30: qty 2

## 2020-08-30 MED ORDER — ACETAMINOPHEN 325 MG PO TABS: 650.0000 mg | ORAL_TABLET | ORAL | Status: DC | PRN

## 2020-08-30 MED ORDER — PROPOFOL 10 MG/ML IV BOLUS
INTRAVENOUS | Status: AC
  Filled 2020-08-30: qty 20

## 2020-08-30 MED ORDER — ACETAMINOPHEN 160 MG/5ML PO SOLN: 650.0000 mg | ORAL | Status: DC | PRN

## 2020-08-30 MED ORDER — GLYCOPYRROLATE PF 0.2 MG/ML IJ SOSY
PREFILLED_SYRINGE | INTRAMUSCULAR | Status: DC | PRN
  Administered 2020-08-30: .2 mg via INTRAVENOUS
  Administered 2020-08-30: .1 mg via INTRAVENOUS

## 2020-08-30 MED ORDER — EPHEDRINE SULFATE-NACL 50-0.9 MG/10ML-% IV SOSY
PREFILLED_SYRINGE | INTRAVENOUS | Status: DC | PRN
  Administered 2020-08-30 (×3): 5 mg via INTRAVENOUS

## 2020-08-30 MED ORDER — PRAVASTATIN SODIUM 10 MG PO TABS
10.0000 mg | ORAL_TABLET | Freq: Every day | ORAL | Status: DC
  Administered 2020-08-30: 10 mg via ORAL
  Filled 2020-08-30: qty 1

## 2020-08-30 MED ORDER — LIDOCAINE HCL 1 % IJ SOLN
INTRAMUSCULAR | Status: AC
  Filled 2020-08-30: qty 20

## 2020-08-30 MED ORDER — SODIUM CHLORIDE 0.9 % IV SOLN
INTRAVENOUS | Status: DC | PRN
Start: 2020-08-30 — End: 2020-08-31

## 2020-08-30 MED ORDER — MEPERIDINE HCL 25 MG/ML IJ SOLN: 6.2500 mg | INTRAMUSCULAR | Status: DC | PRN

## 2020-08-30 MED ORDER — ALBUTEROL SULFATE HFA 108 (90 BASE) MCG/ACT IN AERS
2.0000 | INHALATION_SPRAY | Freq: Once | RESPIRATORY_TRACT | Status: DC | PRN
  Filled 2020-08-30: qty 6.7

## 2020-08-30 MED ORDER — PHENYLEPHRINE HCL-NACL 20-0.9 MG/250ML-% IV SOLN
0.0000 ug/min | INTRAVENOUS | Status: AC
Start: 2020-08-30 — End: 2020-08-31
  Administered 2020-08-30: 40 ug/min via INTRAVENOUS
  Administered 2020-08-31: 50 ug/min via INTRAVENOUS
  Administered 2020-08-31: 60 ug/min via INTRAVENOUS
  Filled 2020-08-30 (×2): qty 250

## 2020-08-30 MED ORDER — FENTANYL CITRATE (PF) 100 MCG/2ML IJ SOLN: 25.0000 ug | INTRAMUSCULAR | Status: DC | PRN

## 2020-08-30 MED ORDER — ONDANSETRON HCL 4 MG/2ML IJ SOLN
INTRAMUSCULAR | Status: DC | PRN
  Administered 2020-08-30: 4 mg via INTRAVENOUS

## 2020-08-30 MED ORDER — TICAGRELOR 90 MG PO TABS
90.0000 mg | ORAL_TABLET | Freq: Two times a day (BID) | ORAL | Status: DC
  Administered 2020-08-30 – 2020-08-31 (×2): 90 mg via ORAL
  Filled 2020-08-30 (×2): qty 1

## 2020-08-30 NOTE — ED Provider Notes (Signed)
[facility] EMERGENCY DEPARTMENT Provider Note   CSN: [id number] Arrival date & time: 08/30/20  [id number]     History Chief Complaint  Patient presents with   Eye Problem    Caleb Powell is a 50 y.o. male.  50 yo M with a cc of stroke like symptoms.  Occurred yesterday and was thought to be a TIA.  He was admitted to the [facility] and was found to have a severe stenosis to the right IJ.  He was planned to be sent here to be evaluated for stent placement.  He left AGAINST MEDICAL ADVICE and then returned here this morning to be evaluated.  Denies any change in his symptoms.  Remain completely resolved.  No issue with his vision no difficulty with speech no one-sided weakness or numbness.  He denies cough congestion or fever.  Has had a sore throat off and on for months and is seen his family doctor for this.  The history is provided by the patient.  Eye Problem Associated symptoms: no discharge, no headaches and no vomiting   Illness Severity:  Moderate Onset quality:  Gradual Duration:  5 minutes Timing:  Rare Progression:  Resolved Chronicity:  New Associated symptoms: no abdominal pain, no chest pain, no congestion, no diarrhea, no fever, no headaches, no myalgias, no rash, no shortness of breath and no vomiting       Past Medical History:  Diagnosis Date   Hypertension     Patient Active Problem List   Diagnosis Date Noted   Internal carotid artery stenosis, right 08/30/2020   Hypertensive crisis 08/29/2020   Leukocytosis 08/29/2020   Chest pain 08/29/2020   TIA (transient ischemic attack) 08/28/2020   Hyperlipidemia 02/24/2018   Tobacco abuse 02/24/2018   Mood disorder ([place]) 02/24/2018    History reviewed. No pertinent surgical history.     Family History  Problem Relation Age of Onset   Diabetes Mother    Heart disease Mother    Stroke Mother    Hypertension Maternal Grandmother    Hypertension Maternal Grandfather    Heart  disease Father     Social History   Tobacco Use   Smoking status: Every Day    Packs/day: 2.50    Years: 36.00    Pack years: 90.00    Types: Cigarettes   Smokeless tobacco: Former  Scientific laboratory technician Use: Former   Devices: used for about one month  Substance Use Topics   Alcohol use: No   Drug use: No    Home Medications Prior to Admission medications   Medication Sig Start Date End Date Taking? Authorizing Provider  amLODipine (NORVASC) 5 MG tablet Take 1 tablet (5 mg total) by mouth daily. Patient not taking: No sig reported 04/17/20   Soyla Dryer, PA-C  fluvastatin (LESCOL) 20 MG capsule Take 1 capsule (20 mg total) by mouth at bedtime. Patient not taking: Reported on 08/28/2020 06/01/19   Soyla Dryer, PA-C  hydrochlorothiazide (MICROZIDE) 12.5 MG capsule Take 12.5 mg by mouth daily. 08/13/20   [provider]  LORazepam (ATIVAN PO) Take 1 tablet by mouth daily. Patient not taking: Reported on 08/28/2020    [provider]  LORazepam (ATIVAN) 0.5 MG tablet Take 0.5 mg by mouth See admin instructions. Take 1/2 in the morning and 1/2 every evening 08/13/20   [provider]  lovastatin (MEVACOR) 10 MG tablet Take 10 mg by mouth daily. Patient not taking: Reported on 08/28/2020 08/28/20  [provider]  omeprazole (PRILOSEC) 40 MG capsule Take 1 capsule (40 mg total) by mouth 2 (two) times daily. 04/17/20   Soyla Dryer, PA-C  ramipril (ALTACE) 5 MG capsule Take 5 mg by mouth daily. 08/28/20   [provider]    Allergies    Penicillins  Review of Systems   Review of Systems  Constitutional:  Negative for chills and fever.  HENT:  Negative for congestion and facial swelling.   Eyes:  Negative for discharge and visual disturbance.  Respiratory:  Negative for shortness of breath.   Cardiovascular:  Negative for chest pain and palpitations.  Gastrointestinal:  Negative for abdominal pain, diarrhea and vomiting.   Musculoskeletal:  Negative for arthralgias and myalgias.  Skin:  Negative for color change and rash.  Neurological:  Positive for dizziness and speech difficulty. Negative for tremors, syncope and headaches.  Psychiatric/Behavioral:  Negative for confusion and dysphoric mood.    Physical Exam Updated Vital Signs BP (!) 145/113   Pulse 75   Temp 98.5 F (36.9 C) (Oral)   Resp 17   Ht '5\' 9"'$  (1.753 m)   Wt 73.9 kg   SpO2 98%   BMI 24.07 kg/m   Physical Exam Vitals and nursing note reviewed.  Constitutional:      Appearance: He is well-developed.  HENT:     Head: Normocephalic and atraumatic.  Eyes:     Pupils: Pupils are equal, round, and reactive to light.  Neck:     Vascular: No JVD.  Cardiovascular:     Rate and Rhythm: Normal rate and regular rhythm.     Heart sounds: No murmur heard.   No friction rub. No gallop.  Pulmonary:     Effort: No respiratory distress.     Breath sounds: No wheezing.  Abdominal:     General: There is no distension.     Tenderness: There is no abdominal tenderness. There is no guarding or rebound.  Musculoskeletal:        General: Normal range of motion.     Cervical back: Normal range of motion and neck supple.  Skin:    Coloration: Skin is not pale.     Findings: No rash.  Neurological:     Mental Status: He is alert and oriented to person, place, and time.  Psychiatric:        Behavior: Behavior normal.    ED Results / Procedures / Treatments   Labs (all labs ordered are listed, but only abnormal results are displayed) Labs Reviewed  BASIC METABOLIC PANEL - Abnormal; Notable for the following components:      Result Value   Potassium 3.4 (*)    Calcium 8.7 (*)    All other components within normal limits  CBC  URINALYSIS, ROUTINE W REFLEX MICROSCOPIC  CBG MONITORING, ED    EKG EKG Interpretation  Date/Time:  Thursday August 30 2020 07:07:18 EDT Ventricular Rate:  80 PR Interval:  132 QRS Duration: 92 QT  Interval:  370 QTC Calculation: 426 R Axis:   70 Text Interpretation: Normal sinus rhythm Normal ECG No significant change since last tracing Confirmed by Deno Etienne [id number]) on 08/30/2020 7:18:48 AM  Radiology CT HEAD WO CONTRAST (5MM)  Result Date: 08/28/2020 CLINICAL DATA:  TIA, headache, vision loss in right eye EXAM: CT HEAD WITHOUT CONTRAST TECHNIQUE: Contiguous axial images were obtained from the base of the skull through the vertex without intravenous contrast. COMPARISON:  None. FINDINGS: Brain: No acute intracranial abnormality. Specifically,  no hemorrhage, hydrocephalus, mass lesion, acute infarction, or significant intracranial injury. Vascular: No hyperdense vessel or unexpected calcification. Skull: No acute calvarial abnormality. Sinuses/Orbits: No acute findings Other: None IMPRESSION: No acute intracranial abnormality. Electronically Signed   By: Rolm Baptise M.D.   On: 08/28/2020 19:32   MR ANGIO HEAD WO CONTRAST  Result Date: 08/29/2020 CLINICAL DATA:  Neuro deficit, acute, stroke suspected. EXAM: MRI HEAD WITHOUT CONTRAST MRA HEAD WITHOUT CONTRAST MRA NECK WITHOUT AND WITH CONTRAST TECHNIQUE: Multiplanar, multi-echo pulse sequences of the brain and surrounding structures were acquired without intravenous contrast. Angiographic images of the Circle of Willis were acquired using MRA technique without intravenous contrast. Angiographic images of the neck were acquired using MRA technique without and with intravenous contrast. Carotid stenosis measurements (when applicable) are obtained utilizing NASCET criteria, using the distal internal carotid diameter as the denominator. CONTRAST:  28m GADAVIST GADOBUTROL 1 MMOL/ML IV SOLN COMPARISON:  CT head 08/28/2020. FINDINGS: MRI HEAD FINDINGS Brain: No acute infarction, hemorrhage, hydrocephalus, extra-axial collection or mass lesion. No pathologic intracranial enhancement. 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 Additional mild scattered T2 hyperintensities in the white matter, nonspecific but most likely related to chronic microvascular ischemic disease. Vascular: See below. Skull and upper cervical spine: Diffuse T1 hypointensity of the visualized cervical bone marrow. Sinuses/Orbits: Mild paranasal sinus mucosal thickening with retention cysts and the left sphenoid sinus and inferior right maxillary sinus. No acute orbital findings. Other: No mastoid effusions. MRA HEAD FINDINGS Anterior circulation: Bilateral intracranial ICAs are patent with mild bilateral paraclinoid ICA narrowing. 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 Bilateral A2 ACAs are patent. Bilateral M1 MCAs are patent without hemodynamically significant stenosis. No proximal M2 branch occlusion. Small 2 mm superiorly directed conical outpatching with vessel arising from the tip involving the right M1 MCA, compatible with infundibulum. Posterior circulation: Non dominant/small left vertebral artery with superimposed moderate intradural stenosis. Bilateral intradural vertebral arteries are patent. The basilar artery and bilateral posterior cerebral arteries are patent without proximal hemodynamically significant stenosis. Anatomic variants: Detailed above. MRA NECK FINDINGS Aortic arch: Great vessel origins are patent. Right carotid system: Moderate narrowing at the carotid bifurcation with severe short-segment stenosis of the proximal ICA (for example see series 1159, image 144; series 1146, image 6). The more distal ICA remains opacified. Left carotid system: Patent. Mild carotid bifurcation and ICA narrowing. Vertebral arteries: Right dominant. Moderate stenosis of the right vertebral artery origin with multifocal mild narrowing of the right vertebral artery. Multifocal severe stenosis of the left  vertebral artery with suspected occlusion of the mid V2 segment with distal V2 reconstitution. IMPRESSION: MRI head: 1. No evidence of acute intracranial abnormality. 2. 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 3. Diffuse T1 hypointensity of the visualized cervical bone marrow. This finding is nonspecific but can be seen with chronic anemia, chronic hypoxia (such as in smokers), obesity, or less commonly a lymphoproliferative disorder. MRA neck: 1. Severe short-segment proximal right ICA stenosis. 2. 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 3. Moderate right vertebral artery origin stenosis. MRA head: 1. No large vessel occlusion. 2. Small left vertebral artery with superimposed moderate intradural stenosis. Electronically Signed   By: FMargaretha Sheffield  M.D.   On: 08/29/2020 09:20   MR ANGIO NECK W WO CONTRAST  Result Date: 08/29/2020 CLINICAL DATA:  Neuro deficit, acute, stroke suspected. EXAM: MRI HEAD WITHOUT CONTRAST MRA HEAD WITHOUT CONTRAST MRA NECK WITHOUT AND WITH CONTRAST TECHNIQUE: Multiplanar, multi-echo pulse sequences of the brain and surrounding structures were acquired without intravenous contrast. Angiographic images of the Circle of Willis were acquired using MRA technique without intravenous contrast. Angiographic images of the neck were acquired using MRA technique without and with intravenous contrast. Carotid stenosis measurements (when applicable) are obtained utilizing NASCET criteria, using the distal internal carotid diameter as the denominator. CONTRAST:  71m GADAVIST GADOBUTROL 1 MMOL/ML IV SOLN COMPARISON:  CT head 08/28/2020. FINDINGS: MRI HEAD FINDINGS Brain: No acute infarction, hemorrhage, hydrocephalus, extra-axial collection or mass lesion. No pathologic intracranial enhancement. Small remote cortical infarct in the  right frontal lobe (series 10, image 18) and small remote infarcts in the left cerebellum (series 10, image 4). Additional mild scattered T2 hyperintensities in the white matter, nonspecific but most likely related to chronic microvascular ischemic disease. Vascular: See below. Skull and upper cervical spine: Diffuse T1 hypointensity of the visualized cervical bone marrow. Sinuses/Orbits: Mild paranasal sinus mucosal thickening with retention cysts and the left sphenoid sinus and inferior right maxillary sinus. No acute orbital findings. Other: No mastoid effusions. MRA HEAD FINDINGS Anterior circulation: Bilateral intracranial ICAs are patent with mild bilateral paraclinoid ICA narrowing. 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 Bilateral A2 ACAs are patent. Bilateral M1 MCAs are patent without hemodynamically significant stenosis. No proximal M2 branch occlusion. Small 2 mm superiorly directed conical outpatching with vessel arising from the tip involving the right M1 MCA, compatible with infundibulum. Posterior circulation: Non dominant/small left vertebral artery with superimposed moderate intradural stenosis. Bilateral intradural vertebral arteries are patent. The basilar artery and bilateral posterior cerebral arteries are patent without proximal hemodynamically significant stenosis. Anatomic variants: Detailed above. MRA NECK FINDINGS Aortic arch: Great vessel origins are patent. Right carotid system: Moderate narrowing at the carotid bifurcation with severe short-segment stenosis of the proximal ICA (for example see series 1159, image 144; series 1146, image 6). The more distal ICA remains opacified. Left carotid system: Patent. Mild carotid bifurcation and ICA narrowing. Vertebral arteries: Right dominant. Moderate stenosis of the right vertebral artery origin with multifocal mild narrowing of the right vertebral artery.  Multifocal severe stenosis of the left vertebral artery with suspected occlusion of the mid V2 segment with distal V2 reconstitution. IMPRESSION: MRI head: 1. No evidence of acute intracranial abnormality. 2. 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 3. Diffuse T1 hypointensity of the visualized cervical bone marrow. This finding is nonspecific but can be seen with chronic anemia, chronic hypoxia (such as in smokers), obesity, or less commonly a lymphoproliferative disorder. MRA neck: 1. Severe short-segment proximal right ICA stenosis. 2. 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 3. Moderate right vertebral artery origin stenosis. MRA head: 1. No large vessel occlusion. 2. Small left vertebral artery with superimposed moderate intradural stenosis. Electronically Signed   By: FMargaretha SheffieldM.D.   On: 08/29/2020 09:20   MR BRAIN WO CONTRAST  Result Date: 08/29/2020 CLINICAL DATA:  Neuro deficit, acute, stroke suspected. EXAM: MRI HEAD WITHOUT CONTRAST MRA HEAD WITHOUT CONTRAST MRA NECK WITHOUT AND WITH CONTRAST TECHNIQUE: Multiplanar,  multi-echo pulse sequences of the brain and surrounding structures were acquired without intravenous contrast. Angiographic images of the Circle of Willis were acquired using MRA technique without intravenous contrast. Angiographic images of the neck were acquired using MRA technique without and with intravenous contrast. Carotid stenosis measurements (when applicable) are obtained utilizing NASCET criteria, using the distal internal carotid diameter as the denominator. CONTRAST:  37m GADAVIST GADOBUTROL 1 MMOL/ML IV SOLN COMPARISON:  CT head 08/28/2020. FINDINGS: MRI HEAD FINDINGS Brain: No acute infarction, hemorrhage, hydrocephalus, extra-axial collection or mass lesion. No pathologic intracranial enhancement. Small  remote cortical infarct in the right frontal lobe (series 10, image 18) and small remote infarcts in the left cerebellum (series 10, image 4). Additional mild scattered T2 hyperintensities in the white matter, nonspecific but most likely related to chronic microvascular ischemic disease. Vascular: See below. Skull and upper cervical spine: Diffuse T1 hypointensity of the visualized cervical bone marrow. Sinuses/Orbits: Mild paranasal sinus mucosal thickening with retention cysts and the left sphenoid sinus and inferior right maxillary sinus. No acute orbital findings. Other: No mastoid effusions. MRA HEAD FINDINGS Anterior circulation: Bilateral intracranial ICAs are patent with mild bilateral paraclinoid ICA narrowing. 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 Bilateral A2 ACAs are patent. Bilateral M1 MCAs are patent without hemodynamically significant stenosis. No proximal M2 branch occlusion. Small 2 mm superiorly directed conical outpatching with vessel arising from the tip involving the right M1 MCA, compatible with infundibulum. Posterior circulation: Non dominant/small left vertebral artery with superimposed moderate intradural stenosis. Bilateral intradural vertebral arteries are patent. The basilar artery and bilateral posterior cerebral arteries are patent without proximal hemodynamically significant stenosis. Anatomic variants: Detailed above. MRA NECK FINDINGS Aortic arch: Great vessel origins are patent. Right carotid system: Moderate narrowing at the carotid bifurcation with severe short-segment stenosis of the proximal ICA (for example see series 1159, image 144; series 1146, image 6). The more distal ICA remains opacified. Left carotid system: Patent. Mild carotid bifurcation and ICA narrowing. Vertebral arteries: Right dominant. Moderate stenosis of the right vertebral artery origin with multifocal mild narrowing of the  right vertebral artery. Multifocal severe stenosis of the left vertebral artery with suspected occlusion of the mid V2 segment with distal V2 reconstitution. IMPRESSION: MRI head: 1. No evidence of acute intracranial abnormality. 2. 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 3. Diffuse T1 hypointensity of the visualized cervical bone marrow. This finding is nonspecific but can be seen with chronic anemia, chronic hypoxia (such as in smokers), obesity, or less commonly a lymphoproliferative disorder. MRA neck: 1. Severe short-segment proximal right ICA stenosis. 2. 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 3. Moderate right vertebral artery origin stenosis. MRA head: 1. No large vessel occlusion. 2. Small left vertebral artery with superimposed moderate intradural stenosis. Electronically Signed   By: FMargaretha SheffieldM.D.   On: 08/29/2020 09:20   [place]NAL  Result Date: 08/29/2020 CLINICAL DATA:  Hypertensive crisis EXAM: RENAL / URINARY TRACT ULTRASOUND COMPLETE COMPARISON:  None. FINDINGS: Right Kidney: Renal measurements: 11.3 cm x 6.0 cm x 6.2 cm = volume: 218 mL. Echogenicity within normal limits. No mass or hydronephrosis visualized. Left Kidney: Renal measurements: 11.7 cm x 6.5 cm x 5.4 cm = volume: 213 mL. Echogenicity within normal limits. No mass or hydronephrosis visualized. Bladder: Appears normal for  degree of bladder distention. Other: None. IMPRESSION: Normal renal ultrasound. Electronically Signed   By: Valetta Mole M.D.   On: 08/29/2020 11:07   DG Chest Port 1 View  Result Date: 08/28/2020 CLINICAL DATA:  Chest pain, dyspnea EXAM: PORTABLE CHEST 1 VIEW COMPARISON:  None. FINDINGS: Lungs are clear. No pneumothorax or pleural effusion. Cardiac size within normal limits. Pulmonary vascularity is normal. Healed left mid clavicular  fracture noted. No acute bone abnormality IMPRESSION: No active disease. Electronically Signed   By: Fidela Salisbury M.D.   On: 08/28/2020 19:28   ECHOCARDIOGRAM COMPLETE  Result Date: 08/29/2020    ECHOCARDIOGRAM REPORT   Patient Name:   Caleb Powell Date of Exam: 08/29/2020 Medical Rec #:  [id number]    Height:       69.0 in Accession #:    [id number]   Weight:       175.0 lb Date of Birth:  10/11/1970     BSA:          1.952 m Patient Age:    54 years     BP:           146/70 mmHg Patient Gender: M            HR:           62 bpm. Exam Location:  [name] Procedure: 2D Echo, Cardiac Doppler and Color Doppler Indications:    TIA  History:        Patient has no prior history of Echocardiogram examinations.                 TIA, Signs/Symptoms:Chest Pain; Risk Factors:Hypertension and                 Current Smoker. COVID +.  Sonographer:    Wenda Low Referring Phys: [id number] ASIA B [place]  1. Left ventricular ejection fraction, by estimation, is 60 to 65%. The left ventricle has normal function. The left ventricle has no regional wall motion abnormalities. Left ventricular diastolic parameters were normal.  2. Right ventricular systolic function is normal. The right ventricular size is normal. Tricuspid regurgitation signal is inadequate for assessing PA pressure.  3. The mitral valve is normal in structure. No evidence of mitral valve regurgitation. No evidence of mitral stenosis.  4. The aortic valve is tricuspid. There is moderate calcification of the aortic valve. There is moderate thickening of the aortic valve. Aortic valve regurgitation is not visualized. No aortic stenosis is present. FINDINGS  Left Ventricle: Left ventricular ejection fraction, by estimation, is 60 to 65%. The left ventricle has normal function. The left ventricle has no regional wall motion abnormalities. The left ventricular internal cavity size was normal in size. There is  no left ventricular hypertrophy. Left  ventricular diastolic parameters were normal. Right Ventricle: The right ventricular size is normal. No increase in right ventricular wall thickness. Right ventricular systolic function is normal. Tricuspid regurgitation signal is inadequate for assessing PA pressure. Left Atrium: Left atrial size was normal in size. Right Atrium: Right atrial size was normal in size. Pericardium: There is no evidence of pericardial effusion. Mitral Valve: The mitral valve is normal in structure. Mild mitral annular calcification. No evidence of mitral valve regurgitation. No evidence of mitral valve stenosis. MV peak gradient, 4.0 mmHg. The mean mitral valve gradient is 2.0 mmHg. Tricuspid Valve: The tricuspid valve is normal in structure. Tricuspid valve regurgitation is not demonstrated. No evidence of tricuspid stenosis. Aortic Valve: The aortic  valve is tricuspid. There is moderate calcification of the aortic valve. There is moderate thickening of the aortic valve. There is moderate aortic valve annular calcification. Aortic valve regurgitation is not visualized. No aortic stenosis is present. Aortic valve mean gradient measures 4.0 mmHg. Aortic valve peak gradient measures 8.9 mmHg. Aortic valve area, by VTI measures 2.41 cm. Pulmonic Valve: The pulmonic valve was not well visualized. Pulmonic valve regurgitation is not visualized. No evidence of pulmonic stenosis. Aorta: The aortic root is normal in size and structure. Venous: IVC is small, suggesting low RA pressure and hypovolemia. IAS/Shunts: No atrial level shunt detected by color flow Doppler.  LEFT VENTRICLE PLAX 2D LVIDd:         4.94 cm  Diastology LVIDs:         3.35 cm  LV e' medial:    9.37 cm/s LV PW:         1.06 cm  LV E/e' medial:  8.5 LV IVS:        0.91 cm  LV e' lateral:   11.60 cm/s LVOT diam:     2.00 cm  LV E/e' lateral: 6.9 LV SV:         75 LV SV Index:   39 LVOT Area:     3.14 cm  RIGHT VENTRICLE RV Basal diam:  2.76 cm RV Mid diam:    2.75 cm LEFT  ATRIUM             Index       RIGHT ATRIUM           Index LA diam:        3.90 cm 2.00 cm/m  RA Area:     15.90 cm LA Vol (A2C):   48.4 ml 24.79 ml/m RA Volume:   45.20 ml  23.15 ml/m LA Vol (A4C):   52.8 ml 27.05 ml/m LA Biplane Vol: 51.2 ml 26.23 ml/m  AORTIC VALVE AV Area (Vmax):    2.05 cm AV Area (Vmean):   2.06 cm AV Area (VTI):     2.41 cm AV Vmax:           149.00 cm/s AV Vmean:          94.700 cm/s AV VTI:            0.313 m AV Peak Grad:      8.9 mmHg AV Mean Grad:      4.0 mmHg LVOT Vmax:         97.30 cm/s LVOT Vmean:        62.200 cm/s LVOT VTI:          0.240 m LVOT/AV VTI ratio: 0.77  AORTA Ao Root diam: 2.80 cm MITRAL VALVE MV Area (PHT): 3.79 cm    SHUNTS MV Area VTI:   2.51 cm    Systemic VTI:  0.24 m MV Peak grad:  4.0 mmHg    Systemic Diam: 2.00 cm MV Mean grad:  2.0 mmHg MV Vmax:       1.00 m/s MV Vmean:      58.1 cm/s MV Decel Time: 200 msec MV E velocity: 80.10 cm/s MV A velocity: 78.00 cm/s MV E/A ratio:  1.03 Carlyle Dolly MD Electronically signed by Carlyle Dolly MD Signature Date/Time: 08/29/2020/12:08:14 PM    Final     Procedures Procedures   Medications Ordered in ED Medications - No data to display  ED Course  I have reviewed the triage vital signs and the nursing notes.  Pertinent labs & imaging results that were available during my care of the patient were reviewed by me and considered in my medical decision making (see chart for details).    MDM Rules/Calculators/A&P                           50 yo M with a cc of stroke like symptoms.  Occurred a couple days ago.  Resolved, thought to be TIA, admitted at [facility] plan to transfer here to be seen for vascular intervention.  Patient still asymptomatic.  Of note he also tested positive for COVID.  He denies any COVID symptoms.  He had his family who he lives with tested and nobody else is positive.  Has a sore throat off and on but no other symptoms.  I discussed the case with Dr. Curly Shores,  neurology she discussed this with interventional who plan to place a stent emergently.  Recommended medical admission.  Patient will require ICU level care.  Discussed with critical care.  CRITICAL CARE Performed by: Cecilio Asper   Total critical care time: 35 minutes  Critical care time was exclusive of separately billable procedures and treating other patients.  Critical care was necessary to treat or prevent imminent or life-threatening deterioration.  Critical care was time spent personally by me on the following activities: development of treatment plan with patient and/or surrogate as well as nursing, discussions with consultants, evaluation of patient's response to treatment, examination of patient, obtaining history from patient or surrogate, ordering and performing treatments and interventions, ordering and review of laboratory studies, ordering and review of radiographic studies, pulse oximetry and re-evaluation of patient's condition.   The patients results and plan were reviewed and discussed.   Any x-rays performed were independently reviewed by myself.   Differential diagnosis were considered with the presenting HPI.  Medications - No data to display  Vitals:   08/30/20 0654 08/30/20 0655 08/30/20 0655 08/30/20 0745  BP: (!) 148/80  (!) 148/80 (!) 145/113  Pulse: 80  79 75  Resp: '18  18 17  '$ Temp: 98.5 F (36.9 C)  98.5 F (36.9 C)   TempSrc: Oral  Oral   SpO2: 100%  97% 98%  Weight:  73.9 kg    Height:  '5\' 9"'$  (1.753 m)      Final diagnoses:  TIA (transient ischemic attack)  ICAO (internal carotid artery occlusion), right    Admission/ observation were discussed with the admitting physician, patient and/or family and they are comfortable with the plan.   Final Clinical Impression(s) / ED Diagnoses Final diagnoses:  TIA (transient ischemic attack)  ICAO (internal carotid artery occlusion), right    Rx / DC Orders ED Discharge Orders     None         Deno Etienne, DO 08/30/20 [phone]

## 2020-08-30 NOTE — Care Plan (Signed)
Patient discussed briefly with Dr. Tyrone Nine.  Presented with amaurosis fugax of the right eye to [facility] yesterday where a full stroke work-up was completed and notable for a severe short segment stenosis of the right ICA.  Discussed with Dr. Karenann Cai, neuro interventional radiology, who will plan to see the patient as he would be a good candidate for a right ICA stent.   Recommendations: - Regarding stroke risk optimization, patient will be started on antiplatelet agents after stent placement per neuro interventional radiology - Additionally given his lipid profile he does need to be on high intensity statin for goal LDL less than 70, recommend at least 40 - 80 mg of atorvastatin or 20 - 40 mg rosuvastatin - Given his full stroke work-up has already been completed and he has no neurological deficits at this time, outpatient follow-up with neurology is appropriate.  Please place referral to [facility] or Dr. Merlene Laughter closer to [facility] if patient prefers to be seen more locally - Neurology will be available on an as-needed basis, please do not hesitate to reach out if new neurological questions arise.  Please do activate a code stroke if there is an acute change in the patient's neurological status  Lesleigh Noe MD-PhD [facility] [phone]  Available 7 AM to 7 PM, outside these hours please contact Neurologist on call listed on AMION

## 2020-08-30 NOTE — Transfer of Care (Signed)
Immediate Anesthesia Transfer of Care Note  Patient: Caleb Powell  Procedure(s) Performed: IR WITH ANESTHESIA  Patient Location: PACU  Anesthesia Type:MAC  Level of Consciousness: awake, alert  and patient cooperative  Airway & Oxygen Therapy: Patient Spontanous Breathing  Post-op Assessment: Report given to RN and Post -op Vital signs reviewed and stable  Post vital signs: Reviewed  Last Vitals:  Vitals Value Taken Time  BP 129/75 08/30/20 1619  Temp    Pulse 60 08/30/20 1623  Resp 21 08/30/20 1623  SpO2 98 % 08/30/20 1623  Vitals shown include unvalidated device data.  Last Pain:  Vitals:   08/30/20 1200  TempSrc: Oral  PainSc: 0-No pain         Complications: No notable events documented.

## 2020-08-30 NOTE — Progress Notes (Signed)
Patient states he is unable to take pills whole, typically either chews or crushes and places them into small amount of applesauce. Plan for patient to go to [facility] this afternoon and NPO for procedure, d/w Dr. Wendelyn Breslow and okay for patient to crush and mix pre-procedure medications in small amount of water.  Candy Sledge, RN

## 2020-08-30 NOTE — Progress Notes (Signed)
eLink Physician-Brief Progress Note Patient Name: Caleb Powell DOB: 1970/02/26 MRN: [id number]   Date of Service  08/30/2020  HPI/Events of Note  Patient takes Ativan 0.5 mg PO bid chronically for extreme anxiety and is asking for it to be ordered.  eICU Interventions  Ativan 0.5 mg PO bid ordered.        Kerry Kass Keayra Graham 08/30/2020, 8:06 PM

## 2020-08-30 NOTE — Progress Notes (Signed)
Chief Complaint: Patient was seen in consultation today for carotid stenosis  Referring Physician(s): Dr. Curly Shores  Supervising Physician: Pedro Earls  Patient Status: [facility] - In-pt  History of Present Illness: Caleb Powell is a 50 y.o. male with hx of HTN and tobacco use developed loss of vision in his right eye a few days ago. Describes the sxs as complete loss of vision followed by 'like a curtain' with loss of the toip visual fields, followed by resolution. He was seen at [facility] and had workup there. He left there and came here [facility] for continuation of workup. His imaging finds critical (R)ICA stenosis as well as bilateral vert art stenosis (L)>(R). NIR is asked to eval for consideration of stent angioplasty. Family at bedside. Pt currently without neurologic sxs. Incidental covid+ test yesterday. Denies sxs or ill contacts. States has dealt with intermittent 'sore throat and cough for a couple years that no one has been able to figure out'    Past Medical History:  Diagnosis Date   Hypertension     History reviewed. No pertinent surgical history.  Allergies: Penicillins  Medications:  Current Facility-Administered Medications:    aspirin chewable tablet 81 mg, 81 mg, Oral, Daily, Takenya Travaglini, PA-C   docusate sodium (COLACE) capsule 100 mg, 100 mg, Oral, BID PRN, Minor, Grace Bushy, NP   pantoprazole (PROTONIX) injection 40 mg, 40 mg, Intravenous, QHS, Minor, Grace Bushy, NP   polyethylene glycol (MIRALAX / GLYCOLAX) packet 17 g, 17 g, Oral, Daily PRN, Minor, Grace Bushy, NP   ticagrelor (BRILINTA) tablet 180 mg, 180 mg, Oral, Once, Ascencion Dike, PA-C  Current Outpatient Medications:    amLODipine (NORVASC) 5 MG tablet, Take 1 tablet (5 mg total) by mouth daily. (Patient not taking: No sig reported), Disp: 30 tablet, Rfl: 3   fluvastatin (LESCOL) 20 MG capsule, Take 1 capsule (20 mg total) by mouth at bedtime. (Patient not taking: Reported on 08/28/2020),  Disp: 30 capsule, Rfl: 4   hydrochlorothiazide (MICROZIDE) 12.5 MG capsule, Take 12.5 mg by mouth daily., Disp: , Rfl:    LORazepam (ATIVAN PO), Take 1 tablet by mouth daily. (Patient not taking: Reported on 08/28/2020), Disp: , Rfl:    LORazepam (ATIVAN) 0.5 MG tablet, Take 0.5 mg by mouth See admin instructions. Take 1/2 in the morning and 1/2 every evening, Disp: , Rfl:    lovastatin (MEVACOR) 10 MG tablet, Take 10 mg by mouth daily. (Patient not taking: Reported on 08/28/2020), Disp: , Rfl:    omeprazole (PRILOSEC) 40 MG capsule, Take 1 capsule (40 mg total) by mouth 2 (two) times daily., Disp: 60 capsule, Rfl: 3   ramipril (ALTACE) 5 MG capsule, Take 5 mg by mouth daily., Disp: , Rfl:     Family History  Problem Relation Age of Onset   Diabetes Mother    Heart disease Mother    Stroke Mother    Hypertension Maternal Grandmother    Hypertension Maternal Grandfather    Heart disease Father     Social History   Socioeconomic History   Marital status: Legally Separated    Spouse name: Not on file   Number of children: Not on file   Years of education: Not on file   Highest education level: Not on file  Occupational History   Not on file  Tobacco Use   Smoking status: Every Day    Packs/day: 2.50    Years: 36.00    Pack years: 90.00    Types: Cigarettes  Smokeless tobacco: Former  Scientific laboratory technician Use: Former   Devices: used for about one month  Substance and Sexual Activity   Alcohol use: No   Drug use: No   Sexual activity: Yes    Birth control/protection: None  Other Topics Concern   Not on file  Social History Narrative   Not on file   Social Determinants of Health   Financial Resource Strain: Not on file  Food Insecurity: Not on file  Transportation Needs: Not on file  Physical Activity: Not on file  Stress: Not on file  Social Connections: Not on file    Review of Systems: A 12 point ROS discussed and pertinent positives are indicated in the HPI  above.  All other systems are negative.  Review of Systems  Vital Signs: BP (!) 145/113   Pulse 75   Temp 98.5 F (36.9 C) (Oral)   Resp 17   Ht '5\' 9"'$  (1.753 m)   Wt 73.9 kg   SpO2 98%   BMI 24.07 kg/m   Physical Exam Constitutional:      General: He is not in acute distress.    Appearance: Normal appearance. He is not ill-appearing.  HENT:     Mouth/Throat:     Mouth: Mucous membranes are moist.     Pharynx: Oropharynx is clear.  Eyes:     Extraocular Movements: Extraocular movements intact.     Pupils: Pupils are equal, round, and reactive to light.  Cardiovascular:     Rate and Rhythm: Normal rate and regular rhythm.     Pulses: Normal pulses.     Heart sounds: Normal heart sounds.  Pulmonary:     Effort: Pulmonary effort is normal. No respiratory distress.     Breath sounds: Normal breath sounds.  Musculoskeletal:     Cervical back: Normal range of motion and neck supple.  Skin:    General: Skin is warm and dry.  Neurological:     General: No focal deficit present.     Mental Status: He is alert and oriented to person, place, and time.     Cranial Nerves: No cranial nerve deficit.     Coordination: Coordination normal.  Psychiatric:        Mood and Affect: Mood normal.        Thought Content: Thought content normal.        Judgment: Judgment normal.     Imaging: CT HEAD WO CONTRAST (5MM)  Result Date: 08/28/2020 CLINICAL DATA:  TIA, headache, vision loss in right eye EXAM: CT HEAD WITHOUT CONTRAST TECHNIQUE: Contiguous axial images were obtained from the base of the skull through the vertex without intravenous contrast. COMPARISON:  None. FINDINGS: Brain: No acute intracranial abnormality. Specifically, no hemorrhage, hydrocephalus, mass lesion, acute infarction, or significant intracranial injury. Vascular: No hyperdense vessel or unexpected calcification. Skull: No acute calvarial abnormality. Sinuses/Orbits: No acute findings Other: None IMPRESSION: No  acute intracranial abnormality. Electronically Signed   By: Rolm Baptise M.D.   On: 08/28/2020 19:32   MR ANGIO HEAD WO CONTRAST  Result Date: 08/29/2020 CLINICAL DATA:  Neuro deficit, acute, stroke suspected. EXAM: MRI HEAD WITHOUT CONTRAST MRA HEAD WITHOUT CONTRAST MRA NECK WITHOUT AND WITH CONTRAST TECHNIQUE: Multiplanar, multi-echo pulse sequences of the brain and surrounding structures were acquired without intravenous contrast. Angiographic images of the Circle of Willis were acquired using MRA technique without intravenous contrast. Angiographic images of the neck were acquired using MRA technique without and with intravenous contrast.  Carotid stenosis measurements (when applicable) are obtained utilizing NASCET criteria, using the distal internal carotid diameter as the denominator. CONTRAST:  28m GADAVIST GADOBUTROL 1 MMOL/ML IV SOLN COMPARISON:  CT head 08/28/2020. FINDINGS: MRI HEAD FINDINGS Brain: No acute infarction, hemorrhage, hydrocephalus, extra-axial collection or mass lesion. No pathologic intracranial enhancement. 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 Additional mild scattered T2 hyperintensities in the white matter, nonspecific but most likely related to chronic microvascular ischemic disease. Vascular: See below. Skull and upper cervical spine: Diffuse T1 hypointensity of the visualized cervical bone marrow. Sinuses/Orbits: Mild paranasal sinus mucosal thickening with retention cysts and the left sphenoid sinus and inferior right maxillary sinus. No acute orbital findings. Other: No mastoid effusions. MRA HEAD FINDINGS Anterior circulation: Bilateral intracranial ICAs are patent with mild bilateral paraclinoid ICA narrowing. 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 Bilateral A2 ACAs are patent.  Bilateral M1 MCAs are patent without hemodynamically significant stenosis. No proximal M2 branch occlusion. Small 2 mm superiorly directed conical outpatching with vessel arising from the tip involving the right M1 MCA, compatible with infundibulum. Posterior circulation: Non dominant/small left vertebral artery with superimposed moderate intradural stenosis. Bilateral intradural vertebral arteries are patent. The basilar artery and bilateral posterior cerebral arteries are patent without proximal hemodynamically significant stenosis. Anatomic variants: Detailed above. MRA NECK FINDINGS Aortic arch: Great vessel origins are patent. Right carotid system: Moderate narrowing at the carotid bifurcation with severe short-segment stenosis of the proximal ICA (for example see series 1159, image 144; series 1146, image 6). The more distal ICA remains opacified. Left carotid system: Patent. Mild carotid bifurcation and ICA narrowing. Vertebral arteries: Right dominant. Moderate stenosis of the right vertebral artery origin with multifocal mild narrowing of the right vertebral artery. Multifocal severe stenosis of the left vertebral artery with suspected occlusion of the mid V2 segment with distal V2 reconstitution. IMPRESSION: MRI head: 1. No evidence of acute intracranial abnormality. 2. 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 3. Diffuse T1 hypointensity of the visualized cervical bone marrow. This finding is nonspecific but can be seen with chronic anemia, chronic hypoxia (such as in smokers), obesity, or less commonly a lymphoproliferative disorder. MRA neck: 1. Severe short-segment proximal right ICA stenosis. 2. 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 3. Moderate right vertebral artery origin stenosis. MRA head: 1. No large vessel occlusion. 2. Small left  vertebral artery with superimposed moderate intradural stenosis. Electronically Signed   By: FMargaretha SheffieldM.D.   On: 08/29/2020 09:20   MR ANGIO NECK W WO CONTRAST  Result Date: 08/29/2020 CLINICAL DATA:  Neuro deficit, acute, stroke suspected. EXAM: MRI HEAD WITHOUT CONTRAST MRA HEAD WITHOUT CONTRAST MRA NECK WITHOUT AND WITH CONTRAST TECHNIQUE: Multiplanar, multi-echo pulse sequences of the brain and surrounding structures were acquired without intravenous contrast. Angiographic images of the Circle of Willis were acquired using MRA technique without intravenous contrast. Angiographic images of the neck were acquired using MRA technique without and with intravenous contrast. Carotid stenosis measurements (when applicable) are obtained utilizing NASCET criteria, using the distal internal carotid diameter as the denominator. CONTRAST:  749mGADAVIST GADOBUTROL 1 MMOL/ML IV SOLN COMPARISON:  CT head 08/28/2020. FINDINGS: MRI HEAD FINDINGS Brain: No acute infarction,  hemorrhage, hydrocephalus, extra-axial collection or mass lesion. No pathologic intracranial enhancement. 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 Additional mild scattered T2 hyperintensities in the white matter, nonspecific but most likely related to chronic microvascular ischemic disease. Vascular: See below. Skull and upper cervical spine: Diffuse T1 hypointensity of the visualized cervical bone marrow. Sinuses/Orbits: Mild paranasal sinus mucosal thickening with retention cysts and the left sphenoid sinus and inferior right maxillary sinus. No acute orbital findings. Other: No mastoid effusions. MRA HEAD FINDINGS Anterior circulation: Bilateral intracranial ICAs are patent with mild bilateral paraclinoid ICA narrowing. Hypoplastic or absent left A1 ACA with widely patent prominent/dominant right A1 ACA, likely congenital anatomic  variant. Bilateral A2 ACAs are patent. Bilateral M1 MCAs are patent without hemodynamically significant stenosis. No proximal M2 branch occlusion. Small 2 mm superiorly directed conical outpatching with vessel arising from the tip involving the right M1 MCA, compatible with infundibulum. Posterior circulation: Non dominant/small left vertebral artery with superimposed moderate intradural stenosis. Bilateral intradural vertebral arteries are patent. The basilar artery and bilateral posterior cerebral arteries are patent without proximal hemodynamically significant stenosis. Anatomic variants: Detailed above. MRA NECK FINDINGS Aortic arch: Great vessel origins are patent. Right carotid system: Moderate narrowing at the carotid bifurcation with severe short-segment stenosis of the proximal ICA (for example see series 1159, image 144; series 1146, image 6). The more distal ICA remains opacified. Left carotid system: Patent. Mild carotid bifurcation and ICA narrowing. Vertebral arteries: Right dominant. Moderate stenosis of the right vertebral artery origin with multifocal mild narrowing of the right vertebral artery. Multifocal severe stenosis of the left vertebral artery with suspected occlusion of the mid V2 segment with distal V2 reconstitution. IMPRESSION: MRI head: 1. No evidence of acute intracranial abnormality. 2. 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 3. Diffuse T1 hypointensity of the visualized cervical bone marrow. This finding is nonspecific but can be seen with chronic anemia, chronic hypoxia (such as in smokers), obesity, or less commonly a lymphoproliferative disorder. MRA neck: 1. Severe short-segment proximal right ICA stenosis. 2. 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 3. Moderate right vertebral artery origin stenosis. MRA head: 1. No  large vessel occlusion. 2. Small left vertebral artery with superimposed moderate intradural stenosis. Electronically Signed   By: Margaretha Sheffield M.D.   On: 08/29/2020 09:20   MR BRAIN WO CONTRAST  Result Date: 08/29/2020 CLINICAL DATA:  Neuro deficit, acute, stroke suspected. EXAM: MRI HEAD WITHOUT CONTRAST MRA HEAD WITHOUT CONTRAST MRA NECK WITHOUT AND WITH CONTRAST TECHNIQUE: Multiplanar, multi-echo pulse sequences of the brain and surrounding structures were acquired without intravenous contrast. Angiographic images of the Circle of Willis were acquired using MRA technique without intravenous contrast. Angiographic images of the neck were acquired using MRA technique without and with intravenous contrast. Carotid stenosis measurements (when applicable) are obtained utilizing NASCET criteria, using the distal internal carotid diameter as the denominator. CONTRAST:  21m GADAVIST GADOBUTROL 1 MMOL/ML IV SOLN COMPARISON:  CT head 08/28/2020. FINDINGS: MRI HEAD FINDINGS Brain: No acute infarction, hemorrhage, hydrocephalus, extra-axial collection or mass lesion. No pathologic intracranial enhancement. 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 Additional mild scattered T2 hyperintensities in the  white matter, nonspecific but most likely related to chronic microvascular ischemic disease. Vascular: See below. Skull and upper cervical spine: Diffuse T1 hypointensity of the visualized cervical bone marrow. Sinuses/Orbits: Mild paranasal sinus mucosal thickening with retention cysts and the left sphenoid sinus and inferior right maxillary sinus. No acute orbital findings. Other: No mastoid effusions. MRA HEAD FINDINGS Anterior circulation: Bilateral intracranial ICAs are patent with mild bilateral paraclinoid ICA narrowing. Hypoplastic or absent left A1 ACA with widely patent prominent/dominant right A1 ACA,  likely congenital anatomic variant. Bilateral A2 ACAs are patent. Bilateral M1 MCAs are patent without hemodynamically significant stenosis. No proximal M2 branch occlusion. Small 2 mm superiorly directed conical outpatching with vessel arising from the tip involving the right M1 MCA, compatible with infundibulum. Posterior circulation: Non dominant/small left vertebral artery with superimposed moderate intradural stenosis. Bilateral intradural vertebral arteries are patent. The basilar artery and bilateral posterior cerebral arteries are patent without proximal hemodynamically significant stenosis. Anatomic variants: Detailed above. MRA NECK FINDINGS Aortic arch: Great vessel origins are patent. Right carotid system: Moderate narrowing at the carotid bifurcation with severe short-segment stenosis of the proximal ICA (for example see series 1159, image 144; series 1146, image 6). The more distal ICA remains opacified. Left carotid system: Patent. Mild carotid bifurcation and ICA narrowing. Vertebral arteries: Right dominant. Moderate stenosis of the right vertebral artery origin with multifocal mild narrowing of the right vertebral artery. Multifocal severe stenosis of the left vertebral artery with suspected occlusion of the mid V2 segment with distal V2 reconstitution. IMPRESSION: MRI head: 1. No evidence of acute intracranial abnormality. 2. 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 3. Diffuse T1 hypointensity of the visualized cervical bone marrow. This finding is nonspecific but can be seen with chronic anemia, chronic hypoxia (such as in smokers), obesity, or less commonly a lymphoproliferative disorder. MRA neck: 1. Severe short-segment proximal right ICA stenosis. 2. 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 3. Moderate right vertebral artery origin  stenosis. MRA head: 1. No large vessel occlusion. 2. Small left vertebral artery with superimposed moderate intradural stenosis. Electronically Signed   By: Margaretha Sheffield M.D.   On: 08/29/2020 09:20   US RENAL  Result Date: 08/29/2020 CLINICAL DATA:  Hypertensive crisis EXAM: RENAL / URINARY TRACT ULTRASOUND COMPLETE COMPARISON:  None. FINDINGS: Right Kidney: Renal measurements: 11.3 cm x 6.0 cm x 6.2 cm = volume: 218 mL. Echogenicity within normal limits. No mass or hydronephrosis visualized. Left Kidney: Renal measurements: 11.7 cm x 6.5 cm x 5.4 cm = volume: 213 mL. Echogenicity within normal limits. No mass or hydronephrosis visualized. Bladder: Appears normal for degree of bladder distention. Other: None. IMPRESSION: Normal renal ultrasound. Electronically Signed   By: Valetta Mole M.D.   On: 08/29/2020 11:07   DG Chest Port 1 View  Result Date: 08/28/2020 CLINICAL DATA:  Chest pain, dyspnea EXAM: PORTABLE CHEST 1 VIEW COMPARISON:  None. FINDINGS: Lungs are clear. No pneumothorax or pleural effusion. Cardiac size within normal limits. Pulmonary vascularity is normal. Healed left mid clavicular fracture noted. No acute bone abnormality IMPRESSION: No active disease. Electronically Signed   By: Fidela Salisbury M.D.   On: 08/28/2020 19:28   ECHOCARDIOGRAM COMPLETE  Result Date: 08/29/2020    ECHOCARDIOGRAM REPORT   Patient Name:   Caleb Powell Date of Exam: 08/29/2020 Medical Rec #:  [id number]  Height:       69.0 in Accession #:    [id number]   Weight:       175.0 lb Date of Birth:  10/22/1970     BSA:          1.952 m Patient Age:    59 years     BP:           146/70 mmHg Patient Gender: M            HR:           62 bpm. Exam Location:  [name] Procedure: 2D Echo, Cardiac Doppler and Color Doppler Indications:    TIA  History:        Patient has no prior history of Echocardiogram examinations.                 TIA, Signs/Symptoms:Chest Pain; Risk Factors:Hypertension and                 Current  Smoker. COVID +.  Sonographer:    Wenda Low Referring Phys: [id number] ASIA B [place]  1. Left ventricular ejection fraction, by estimation, is 60 to 65%. The left ventricle has normal function. The left ventricle has no regional wall motion abnormalities. Left ventricular diastolic parameters were normal.  2. Right ventricular systolic function is normal. The right ventricular size is normal. Tricuspid regurgitation signal is inadequate for assessing PA pressure.  3. The mitral valve is normal in structure. No evidence of mitral valve regurgitation. No evidence of mitral stenosis.  4. The aortic valve is tricuspid. There is moderate calcification of the aortic valve. There is moderate thickening of the aortic valve. Aortic valve regurgitation is not visualized. No aortic stenosis is present. FINDINGS  Left Ventricle: Left ventricular ejection fraction, by estimation, is 60 to 65%. The left ventricle has normal function. The left ventricle has no regional wall motion abnormalities. The left ventricular internal cavity size was normal in size. There is  no left ventricular hypertrophy. Left ventricular diastolic parameters were normal. Right Ventricle: The right ventricular size is normal. No increase in right ventricular wall thickness. Right ventricular systolic function is normal. Tricuspid regurgitation signal is inadequate for assessing PA pressure. Left Atrium: Left atrial size was normal in size. Right Atrium: Right atrial size was normal in size. Pericardium: There is no evidence of pericardial effusion. Mitral Valve: The mitral valve is normal in structure. Mild mitral annular calcification. No evidence of mitral valve regurgitation. No evidence of mitral valve stenosis. MV peak gradient, 4.0 mmHg. The mean mitral valve gradient is 2.0 mmHg. Tricuspid Valve: The tricuspid valve is normal in structure. Tricuspid valve regurgitation is not demonstrated. No evidence of tricuspid stenosis.  Aortic Valve: The aortic valve is tricuspid. There is moderate calcification of the aortic valve. There is moderate thickening of the aortic valve. There is moderate aortic valve annular calcification. Aortic valve regurgitation is not visualized. No aortic stenosis is present. Aortic valve mean gradient measures 4.0 mmHg. Aortic valve peak gradient measures 8.9 mmHg. Aortic valve area, by VTI measures 2.41 cm. Pulmonic Valve: The pulmonic valve was not well visualized. Pulmonic valve regurgitation is not visualized. No evidence of pulmonic stenosis. Aorta: The aortic root is normal in size and structure. Venous: IVC is small, suggesting low RA pressure and hypovolemia. IAS/Shunts: No atrial level shunt detected by color flow Doppler.  LEFT VENTRICLE PLAX 2D LVIDd:         4.94 cm  Diastology LVIDs:  3.35 cm  LV e' medial:    9.37 cm/s LV PW:         1.06 cm  LV E/e' medial:  8.5 LV IVS:        0.91 cm  LV e' lateral:   11.60 cm/s LVOT diam:     2.00 cm  LV E/e' lateral: 6.9 LV SV:         75 LV SV Index:   39 LVOT Area:     3.14 cm  RIGHT VENTRICLE RV Basal diam:  2.76 cm RV Mid diam:    2.75 cm LEFT ATRIUM             Index       RIGHT ATRIUM           Index LA diam:        3.90 cm 2.00 cm/m  RA Area:     15.90 cm LA Vol (A2C):   48.4 ml 24.79 ml/m RA Volume:   45.20 ml  23.15 ml/m LA Vol (A4C):   52.8 ml 27.05 ml/m LA Biplane Vol: 51.2 ml 26.23 ml/m  AORTIC VALVE AV Area (Vmax):    2.05 cm AV Area (Vmean):   2.06 cm AV Area (VTI):     2.41 cm AV Vmax:           149.00 cm/s AV Vmean:          94.700 cm/s AV VTI:            0.313 m AV Peak Grad:      8.9 mmHg AV Mean Grad:      4.0 mmHg LVOT Vmax:         97.30 cm/s LVOT Vmean:        62.200 cm/s LVOT VTI:          0.240 m LVOT/AV VTI ratio: 0.77  AORTA Ao Root diam: 2.80 cm MITRAL VALVE MV Area (PHT): 3.79 cm    SHUNTS MV Area VTI:   2.51 cm    Systemic VTI:  0.24 m MV Peak grad:  4.0 mmHg    Systemic Diam: 2.00 cm MV Mean grad:  2.0 mmHg MV  Vmax:       1.00 m/s MV Vmean:      58.1 cm/s MV Decel Time: 200 msec MV E velocity: 80.10 cm/s MV A velocity: 78.00 cm/s MV E/A ratio:  1.03 Carlyle Dolly MD Electronically signed by Carlyle Dolly MD Signature Date/Time: 08/29/2020/12:08:14 PM    Final     Labs:  CBC: Recent Labs    06/13/20 0825 08/12/20 0058 08/28/20 1859 08/30/20 0730  WBC 9.2 11.2* 12.5* 5.9  HGB 16.1 15.4 16.0 15.4  HCT 49.2 46.5 47.6 46.8  PLT 257 275 270 226    COAGS: Recent Labs    08/28/20 1859  INR 1.1    BMP: Recent Labs    03/02/20 1124 08/12/20 0058 08/28/20 1859 08/30/20 0730  NA 137 140 136 137  K 3.9 3.8 3.5 3.4*  CL 102 104 101 103  CO2 '26 26 26 25  '$ GLUCOSE 119* 110* 94 94  BUN '15 18 16 15  '$ CALCIUM 8.8* 8.8* 9.1 8.7*  CREATININE 0.91 1.11 0.98 1.04  GFRNONAA >60 >60 >60 >60    LIVER FUNCTION TESTS: Recent Labs    03/02/20 1124 08/12/20 0058 08/28/20 1859  BILITOT 0.6 0.6 0.5  AST '18 18 15  '$ ALT '20 18 18  '$ ALKPHOS 61 87 81  PROT 7.5  7.3 7.9  ALBUMIN 4.6 4.5 4.8    TUMOR MARKERS: No results for input(s): AFPTM, CEA, CA199, CHROMGRNA in the last 8760 hours.  Assessment and Plan: Amurosis fugax resolved Critical right ICA stenosis, high risk for recurrent TIA/CVA, especially with HTN and tobacco use. Imaging reviewed and pt needs urgent cerebral arteriogram with stent assisted angioplasty of (R)ICA. Explained need for procedure to pt and family. Procedure, including risks, complications, and post procedure expectations discussed in detail. Labs reviewed. Risks and benefits of cerebral angiogram with intervention were discussed with the patient including, but not limited to bleeding, infection, vascular injury, contrast induced renal failure, stroke or even death.  This interventional procedure involves the use of X-rays and because of the nature of the planned procedure, it is possible that we will have prolonged use of X-ray fluoroscopy.  Potential radiation risks  to you include (but are not limited to) the following: - A slightly elevated risk for cancer  several years later in life. This risk is typically less than 0.5% percent. This risk is low in comparison to the normal incidence of human cancer, which is 33% for women and 50% for men according to the [place]. - Radiation induced injury can include skin redness, resembling a rash, tissue breakdown / ulcers and hair loss (which can be temporary or permanent).   The likelihood of either of these occurring depends on the difficulty of the procedure and whether you are sensitive to radiation due to previous procedures, disease, or genetic conditions.   IF your procedure requires a prolonged use of radiation, you will be notified and given written instructions for further action.  It is your responsibility to monitor the irradiated area for the 2 weeks following the procedure and to notify your physician if you are concerned that you have suffered a radiation induced injury.    All of the patient's questions were answered, patient is agreeable to proceed.  Consent signed and in chart.    Thank you for this interesting consult.  I greatly enjoyed meeting TIAN DOUDS and look forward to participating in their care.  A copy of this report was sent to the requesting provider on this date.  Electronically Signed: Ascencion Dike, PA-C 08/30/2020, 11:02 AM   I spent a total of 40 minutes in face to face in clinical consultation, greater than 50% of which was counseling/coordinating care for carotid stenosis

## 2020-08-30 NOTE — ED Triage Notes (Signed)
Pt states he went to his PCP on Tuesday and was diagnosed with an "eye stroke" in right eye and was sent to [facility] yesterday. Pt states he was diagnosed with mini-strokes yesterday and was told he needed to be transferred to see vascular surgeon. Pt states he decided to drive himself. Pt states he does have covid. Pt denies pain, numbness, tingling or blurred vision at this time.

## 2020-08-30 NOTE — Progress Notes (Addendum)
Patient to 4N19 at 1150, a/ox4 on arrival with an NIH of 0. Patient's stepdaughter in waiting area and patient expressing significant anxiety about procedure, requesting that she be allowed back asap.  Belongings at bedside: Pair shoes Pair socks Underwear Shorts Glasses  Candy Sledge, [place]

## 2020-08-30 NOTE — Anesthesia Preprocedure Evaluation (Addendum)
Anesthesia Evaluation  Patient identified by MRN, date of birth, ID band Patient awake    Reviewed: Allergy & Precautions, NPO status , Patient's Chart, lab work & pertinent test results  Airway Mallampati: II  TM Distance: >3 FB Neck ROM: Full    Dental  (+) Poor Dentition, Chipped, Dental Advisory Given   Pulmonary neg pulmonary ROS, Current Smoker,    Pulmonary exam normal breath sounds clear to auscultation       Cardiovascular hypertension, Pt. on medications Normal cardiovascular exam Rhythm:Regular Rate:Normal     Neuro/Psych PSYCHIATRIC DISORDERS TIA   GI/Hepatic negative GI ROS, Neg liver ROS,   Endo/Other  negative endocrine ROS  Renal/GU negative Renal ROS     Musculoskeletal negative musculoskeletal ROS (+)   Abdominal   Peds  Hematology negative hematology ROS (+)   Anesthesia Other Findings   Reproductive/Obstetrics                            Anesthesia Physical Anesthesia Plan  ASA: 3  Anesthesia Plan: MAC   Post-op Pain Management:    Induction: Intravenous  PONV Risk Score and Plan: Treatment may vary due to age or medical condition and Midazolam  Airway Management Planned:   Additional Equipment: Arterial line  Intra-op Plan:   Post-operative Plan:   Informed Consent: I have reviewed the patients History and Physical, chart, labs and discussed the procedure including the risks, benefits and alternatives for the proposed anesthesia with the patient or authorized representative who has indicated his/her understanding and acceptance.     Dental advisory given  Plan Discussed with: CRNA  Anesthesia Plan Comments:        Anesthesia Quick Evaluation

## 2020-08-30 NOTE — Sedation Documentation (Signed)
8 fr angioseal deployed

## 2020-08-30 NOTE — Procedures (Signed)
INTERVENTIONAL NEURORADIOLOGY BRIEF POSTPROCEDURE NOTE  DIAGNOSTIC CEREBRAL ANGIOGRAM RIGHT CAROTID ANGIOPLASTY AND STENTING WITH CEREBRAL PROTECTION DEVICE  Attending: Dr. Pedro Earls  Assistant: None.  Diagnosis: Right carotid stenosis  Access site: RCFA.  Access closure: 8 [place] angioseal.  Anesthesia: MAC.  Medication used: Refer to anesthesia documentation.  Complications: None.  Estimated blood loss: 50 mL  Specimen: None.  Findings: Approximately 75-80% stenosis of the cervical right ICA just distal to the bulb. Angioplasty and stenting performed with a 6-8 x 40 mm XACT carotid stent  with resolution of the stenosis.  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  The patient tolerated the procedure well without incident or complication and is in stable condition.   PLAN: - Bed rest x 6 hours - SBP 120-140 mmHg (24h) - Continue ASA 81 mg qd and ticagrelor 90 mg bid

## 2020-08-30 NOTE — H&P (Signed)
NAME:  BENITO LEVITIN, MRN:  [id number], DOB:  04/22/1970, LOS: 0 ADMISSION DATE:  08/30/2020, CONSULTATION DATE: 08/30/2020 REFERRING MD: Emergency department physician CHIEF COMPLAINT: Visual field disturbances consistent with right internal carotid artery stenosis  History of Present Illness:    Mr. 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  08/29/2020 he noticed a blind spot in his right eye at which time he presented to [facility] for further evaluation and treatment.  CT of the head was negative and MRI revealed stenosis of the right carotid artery.  His anxiety drove him to leave the hospital and independent and represent for possible interventions.  Transfer to [facility] currently emergency department.  Room 20 and is on the list for interventional radiology intervention for carotid stenosis on the right.  Of note he has an incidental finding of COVID-positive which is asymptomatic at this time.  Pertinent  Medical History  Anxiety disorder Persistent refractory hypertension Past Medical History:  Diagnosis Date   Hypertension       Significant Hospital Events: Including procedures, antibiotic start and stop dates in addition to other pertinent events   08/29/2020 signed out to home 08/30/2020 returned for evaluation  Interim History / Subjective:  Presented with visual field changes radiographic examination reveals right ICA blockage  Objective   Blood pressure (!) 145/113, pulse 75, temperature 98.5 F (36.9 C), temperature source Oral, resp. rate 17, height '5\' 9"'$  (1.753 m), weight 73.9 kg, SpO2 98 %.       No intake or output data in the 24 hours ending 08/30/20 1010 Filed Weights   08/30/20 0655  Weight: 73.9 kg    Examination: General:  Well-nourished well-developed male no acute distress HENT: Denies visual changes at this time formally had right upper quadrant amaurosis fugax of right eye Lungs: Clear to auscultation Cardiovascular: Heart sounds are regular Abdomen: Soft nontender Extremities: Warm and dry no edema Neuro: Grossly intact no further visual field disturbances are noted. GU: Voids  Resolved Hospital Problem list     Assessment & Plan:  New onset of neurological deficits including visual disturbances 5 with radiographic evidence of right ICA on the right internal carotid artery. Admit to the neurological intensive care unit Interventional radiology has been consulted with plan for right ICA balloon dilatation and stent placement Neurological status and call code stroke if neurological status changes Blood pressure control  Poorly controlled hypertension Maintain systolic blood pressure [id number] at this time  Anxiety disorder Continue angiolytics  Tobacco abuse disorder Consider pharmacologic intervention with.  Medications to facilitate discontinuation tobacco   Incidental finding of COVID-positive  COVID isolation no further interventions needed at this time              Best Practice (right click and "Reselect all SmartList Selections" daily)   Diet/type: NPO DVT prophylaxis: other GI prophylaxis: PPI Lines: N/A Foley:  N/A Code Status:  full code Last date of multidisciplinary goals of care discussion [tb d] 08/30/2020 patient and daughter updated at bedside. Labs   CBC: Recent Labs  Lab 08/28/20 1859 08/30/20 0730  WBC 12.5* 5.9  NEUTROABS 10.0*  --   HGB 16.0 15.4  HCT 47.6 46.8  MCV 91.0 91.8  PLT 270 [id number]    Basic Metabolic Panel: Recent Labs  Lab 08/28/20  1859 08/30/20 0730  NA 136 137  K 3.5 3.4*  CL 101 103  CO2 26 25  GLUCOSE 94 94  BUN 16 17  CREATININE 0.98 1.04  CALCIUM 9.1 8.7*   GFR: Estimated Creatinine Clearance: 83.1 mL/min (by C-G  formula based on SCr of 1.04 mg/dL). Recent Labs  Lab 08/28/20 1859 08/30/20 0730  WBC 12.5* 5.9    Liver Function Tests: Recent Labs  Lab 08/28/20 1859  AST 15  ALT 18  ALKPHOS 81  BILITOT 0.5  PROT 7.9  ALBUMIN 4.8   No results for input(s): LIPASE, AMYLASE in the last 168 hours. No results for input(s): AMMONIA in the last 168 hours.  ABG No results found for: PHART, PCO2ART, PO2ART, HCO3, TCO2, ACIDBASEDEF, O2SAT   Coagulation Profile: Recent Labs  Lab 08/28/20 1859  INR 1.1    Cardiac Enzymes: No results for input(s): CKTOTAL, CKMB, CKMBINDEX, TROPONINI in the last 168 hours.  HbA1C: Hgb A1c MFr Bld  Date/Time Value Ref Range Status  08/29/2020 04:36 AM 5.6 4.8 - 5.6 % Final    Comment:    (NOTE) Pre diabetes:          5.7%-6.4%  Diabetes:              >6.4%  Glycemic control for   <7.0% adults with diabetes   06/13/2020 08:26 AM 5.6 4.8 - 5.6 % Corrected    Comment:    (NOTE)         Prediabetes: 5.7 - 6.4         Diabetes: >6.4         Glycemic control for adults with diabetes: <7.0 CORRECTED ON 06/14 AT 1310: PREVIOUSLY REPORTED AS 5.4     CBG: Recent Labs  Lab 08/29/20 1610 08/30/20 0730  GLUCAP 131* 96    Review of Systems:   10 point review of system taken, please see HPI for positives and negatives.   Positive for anxiety Positive for new onset of amaurosis fugax Poorly controlled hypertension   Past Medical History:  He,  has a past medical history of Hypertension.   Surgical History:  History reviewed. No pertinent surgical history.   Social History:   reports that he has been smoking cigarettes. He has a 90.00 pack-year smoking history. He has quit using smokeless tobacco. He reports that he does not drink alcohol and does not use drugs.   Family History:  His family history includes Diabetes in his mother; Heart disease in his father and mother; Hypertension in his maternal grandfather and maternal grandmother; Stroke  in his mother.   Allergies Allergies  Allergen Reactions   Penicillins     Any "cillins"  Unknown allergy reaction.     Home Medications  Prior to Admission medications   Medication Sig Start Date End Date Taking? Authorizing Provider  amLODipine (NORVASC) 5 MG tablet Take 1 tablet (5 mg total) by mouth daily. Patient not taking: No sig reported 04/17/20   Soyla Dryer, PA-C  fluvastatin (LESCOL) 20 MG capsule Take 1 capsule (20 mg total) by mouth at bedtime. Patient not taking: Reported on 08/28/2020 06/01/19   Soyla Dryer, PA-C  hydrochlorothiazide (MICROZIDE) 12.5 MG capsule Take 12.5 mg by mouth daily. 08/13/20   [provider]  LORazepam (ATIVAN PO) Take 1 tablet by mouth daily. Patient not taking: Reported on 08/28/2020    [provider]  LORazepam (ATIVAN) 0.5 MG tablet Take 0.5 mg by mouth See admin instructions. Take 1/2 in  the morning and 1/2 every evening 08/13/20   [provider]  lovastatin (MEVACOR) 10 MG tablet Take 10 mg by mouth daily. Patient not taking: Reported on 08/28/2020 08/28/20   [provider]  omeprazole (PRILOSEC) 40 MG capsule Take 1 capsule (40 mg total) by mouth 2 (two) times daily. 04/17/20   Soyla Dryer, PA-C  ramipril (ALTACE) 5 MG capsule Take 5 mg by mouth daily. 08/28/20   [provider]     Critical care time: 38 min    Richardson Landry Pasha Gadison ACNP Acute Care Nurse Practitioner [place] Please consult Amion 08/30/2020, 10:11 AM

## 2020-08-30 NOTE — Anesthesia Procedure Notes (Signed)
Procedure Name: MAC Date/Time: 08/30/2020 2:33 PM Performed by: Janene Harvey, CRNA Pre-anesthesia Checklist: Patient identified, Emergency Drugs available, Suction available and Patient being monitored Patient Re-evaluated:Patient Re-evaluated prior to induction Oxygen Delivery Method: Nasal cannula Induction Type: IV induction Placement Confirmation: positive ETCO2 Dental Injury: Teeth and Oropharynx as per pre-operative assessment

## 2020-08-30 NOTE — Sedation Documentation (Signed)
Pt transported to PACU room 15 accompanied by RN and CRNA. Robin RN at bedside. Handoff completed. Right groin remains level 0 and bilateral lower distal pulses palpable. Pt awake alert and oriented. No s/s of distress at this time.

## 2020-08-30 NOTE — Progress Notes (Signed)
Pt returned to room, alert and oriented, site unremarkable with TINY amt blood on gauze. BP below goal, neo restarted.

## 2020-08-31 ENCOUNTER — Encounter (HOSPITAL_COMMUNITY): Payer: Self-pay | Admitting: Neuroradiology

## 2020-08-31 ENCOUNTER — Other Ambulatory Visit (HOSPITAL_COMMUNITY): Payer: Self-pay

## 2020-08-31 DIAGNOSIS — G459 Transient cerebral ischemic attack, unspecified: Secondary | ICD-10-CM

## 2020-08-31 LAB — BASIC METABOLIC PANEL
Anion gap: 14 (ref 5–15)
BUN: 16 mg/dL (ref 6–20)
CO2: 20 mmol/L — ABNORMAL LOW (ref 22–32)
Calcium: 8.3 mg/dL — ABNORMAL LOW (ref 8.9–10.3)
Chloride: 103 mmol/L (ref 98–111)
Creatinine, Ser: 1.21 mg/dL (ref 0.61–1.24)
GFR, Estimated: >60 mL/min (ref >60)
Glucose, Bld: 88 mg/dL (ref 70–99)
Potassium: 3.5 mmol/L (ref 3.5–5.1)
Sodium: 137 mmol/L (ref 135–145)

## 2020-08-31 LAB — CBC
HCT: 39.1 % (ref 39.0–52.0)
Hemoglobin: 13.4 g/dL (ref 13.0–17.0)
MCH: 30.7 pg (ref 26.0–34.0)
MCHC: 34.3 g/dL (ref 30.0–36.0)
MCV: 89.7 fL (ref 80.0–100.0)
Platelets: 204 10*3/uL (ref 150–400)
RBC: 4.36 MIL/uL (ref 4.22–5.81)
RDW: 12 % (ref 11.5–15.5)
WBC: 10.4 10*3/uL (ref 4.0–10.5)
nRBC: 0 % (ref 0.0–0.2)

## 2020-08-31 MED ORDER — ASPIRIN 81 MG PO CHEW
81.0000 mg | CHEWABLE_TABLET | Freq: Every day | ORAL | 0 refills | Status: AC
  Filled 2020-08-31: qty 30, 30d supply, fill #0

## 2020-08-31 MED ORDER — SODIUM CHLORIDE 0.9 % IV SOLN: 250.0000 mL | INTRAVENOUS | Status: DC

## 2020-08-31 MED ORDER — NOREPINEPHRINE 4 MG/250ML-% IV SOLN
2.0000 ug/min | INTRAVENOUS | Status: DC
Start: 2020-08-31 — End: 2020-08-31
  Administered 2020-08-31: 2 ug/min via INTRAVENOUS

## 2020-08-31 MED ORDER — TICAGRELOR 90 MG PO TABS
90.0000 mg | ORAL_TABLET | Freq: Two times a day (BID) | ORAL | 0 refills | Status: DC
  Filled 2020-08-31: qty 60, 30d supply, fill #0

## 2020-08-31 NOTE — Progress Notes (Signed)
SBP goal 90-160 and asymptomatic without pressors per K. Karenann Cai.

## 2020-08-31 NOTE — Anesthesia Postprocedure Evaluation (Signed)
Anesthesia Post Note  Patient: Caleb Powell  Procedure(s) Performed: IR WITH ANESTHESIA     Patient location during evaluation: PACU Anesthesia Type: MAC Level of consciousness: awake and alert Pain management: pain level controlled Vital Signs Assessment: post-procedure vital signs reviewed and stable Respiratory status: spontaneous breathing Cardiovascular status: stable Anesthetic complications: no   No notable events documented.  Last Vitals:  Vitals:   08/31/20 1315 08/31/20 1330  BP: 127/78 119/62  Pulse: 64 (!) 59  Resp: 15 14  Temp:    SpO2: 93% 91%    Last Pain:  Vitals:   08/31/20 1200  TempSrc:   PainSc: 0-No pain                 Nolon Nations

## 2020-08-31 NOTE — Progress Notes (Signed)
NAME:  ARISTIDE SHEERIN, MRN:  [id number], DOB:  1970/10/18, LOS: 1 ADMISSION DATE:  08/30/2020, CONSULTATION DATE: 08/30/2020 REFERRING MD: Emergency department physician CHIEF COMPLAINT: Visual field disturbances consistent with right internal carotid artery stenosis  History of Present Illness:    Mr. 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  08/29/2020 he noticed a blind spot in his right eye at which time he presented to [facility] for further evaluation and treatment.  CT of the head was negative and MRI revealed stenosis of the right carotid artery.  His anxiety drove him to leave the hospital and independent and represent for possible interventions.  Transfer to [facility] currently emergency department.  Room 20 and is on the list for interventional radiology intervention for carotid stenosis on the right.  Of note he has an incidental finding of COVID-positive which is asymptomatic at this time.  Pertinent  Medical History  Anxiety disorder Persistent refractory hypertension Past Medical History:  Diagnosis Date   Hypertension       Significant Hospital Events: Including procedures, antibiotic start and stop dates in addition to other pertinent events   08/29/2020 signed out to home 08/30/2020 returned for evaluation  Interim History / Subjective:  8/26: s/p IR R ICA angioplasty with stenting. On asa/brillinta, intermittent pressors and cleviprex to maintain BP within tight range for 24 hours. Pt anxious and wanting to leave. D/w him his aki, procedure and need for monitoring closely with BP for 24hours. Describes some pain at sight of intervention.   Objective   Blood pressure 114/60, pulse 67, temperature 98.3 F (36.8 C), temperature source Axillary, resp. rate  14, height '5\' 8"'$  (1.727 m), weight 73.4 kg, SpO2 99 %.        Intake/Output Summary (Last 24 hours) at 08/31/2020 1101 Last data filed at 08/31/2020 1000 Gross per 24 hour  Intake 1444.08 ml  Output 430 ml  Net 1014.08 ml   Filed Weights   08/30/20 0655 08/30/20 1200 08/31/20 0500  Weight: 73.9 kg 73.4 kg 73.4 kg    Examination: General: Well-nourished well-developed male no acute distress laying down visiting with family.  HENT: Denies visual changes at this time formally had right upper quadrant amaurosis fugax of right eye, perrla, eomi,mmmp Lungs: Clear to auscultation Cardiovascular: Heart sounds are regular Abdomen: Soft nontender Extremities: Warm and dry no edema, R femoral site Neuro: Grossly intact no further visual field disturbances are noted. GU: Voids  Resolved Hospital Problem list     Assessment & Plan:  New onset of neurological deficits including visual disturbances 5 with radiographic evidence of right ICA on the right internal carotid artery. Cont to monitor in neurological intensive care unit -s/p IR procedure.  Neurological status and call code stroke if neurological status changes Blood pressure control  Poorly controlled hypertension Maintain systolic blood pressure 0000000 at this time until at least 1600 today.  -liberalize as able after.  -resume home anti-htn agents once off pressors.   Anxiety disorder Continue anxiolytics  Tobacco abuse disorder Consider pharmacologic intervention with.  Medications to facilitate discontinuation tobacco  Incidental finding of COVID-positive COVID isolation no further interventions needed at this time    Best Practice (right click and "Reselect all SmartList Selections" daily)   Diet/type: Regular consistency (see orders) DVT prophylaxis: other  GI prophylaxis: PPI Lines: N/A Foley:  N/A Code Status:  full code Last date of multidisciplinary goals of care discussion [tbd] 08/30/2020 patient and  daughter updated at bedside. Labs   CBC: Recent Labs  Lab 08/28/20 1859 08/30/20 0730 08/31/20 0452  WBC 12.5* 5.9 10.4  NEUTROABS 10.0*  --   --   HGB 16.0 15.4 13.4  HCT 47.6 46.8 39.1  MCV 91.0 91.8 89.7  PLT 270 226 0000000    Basic Metabolic Panel: Recent Labs  Lab 08/28/20 1859 08/30/20 0730 08/31/20 0452  NA 136 137 137  K 3.5 3.4* 3.5  CL 101 103 103  CO2 26 25 20*  GLUCOSE 94 94 88  BUN '16 17 16  '$ CREATININE 0.98 1.04 1.21  CALCIUM 9.1 8.7* 8.3*   GFR: Estimated Creatinine Clearance: 69.1 mL/min (by C-G formula based on SCr of 1.21 mg/dL). Recent Labs  Lab 08/28/20 1859 08/30/20 0730 08/30/20 1031 08/31/20 0452  PROCALCITON  --   --  <0.10  --   WBC 12.5* 5.9  --  10.4    Liver Function Tests: Recent Labs  Lab 08/28/20 1859  AST 15  ALT 18  ALKPHOS 81  BILITOT 0.5  PROT 7.9  ALBUMIN 4.8   Recent Labs  Lab 08/30/20 1031  LIPASE 45  AMYLASE 60   No results for input(s): AMMONIA in the last 168 hours.  ABG No results found for: PHART, PCO2ART, PO2ART, HCO3, TCO2, ACIDBASEDEF, O2SAT   Coagulation Profile: Recent Labs  Lab 08/28/20 1859 08/30/20 1031  INR 1.1 1.0    Cardiac Enzymes: No results for input(s): CKTOTAL, CKMB, CKMBINDEX, TROPONINI in the last 168 hours.  HbA1C: Hgb A1c MFr Bld  Date/Time Value Ref Range Status  08/29/2020 04:36 AM 5.6 4.8 - 5.6 % Final    Comment:    (NOTE) Pre diabetes:          5.7%-6.4%  Diabetes:              >6.4%  Glycemic control for   <7.0% adults with diabetes   06/13/2020 08:26 AM 5.6 4.8 - 5.6 % Corrected    Comment:    (NOTE)         Prediabetes: 5.7 - 6.4         Diabetes: >6.4         Glycemic control for adults with diabetes: <7.0 CORRECTED ON 06/14 AT 1310: PREVIOUSLY REPORTED AS 5.4     CBG: Recent Labs  Lab 08/28/20 1857 08/29/20 1610 08/30/20 0730  GLUCAP 96 131* 96    Critical care time: The patient is critically ill with multiple organ systems failure and  requires high complexity decision making for assessment and support, frequent evaluation and titration of therapies, application of advanced monitoring technologies and extensive interpretation of multiple databases.  Critical care time 35 mins. This represents my time independent of the NPs time taking care of the pt. This is excluding procedures.    Audria Nine DO [facility] 08/31/2020, 11:01 AM See Amion for pager If no response to pager, please call [phone] until 1900 After 1900 please call [facility] [phone]

## 2020-08-31 NOTE — Progress Notes (Signed)
D/C education given to Pt and all questions answered. Prescriptions delivered to room by TOC prior to d/c . No equipment to deliver. IV's removed. Pt taken to car with all belongings.

## 2020-08-31 NOTE — Discharge Summary (Addendum)
Physician Discharge Summary  Patient ID: Caleb Powell MRN: [id number] DOB/AGE: December 24, 1970 50 y.o.  Admit date: 08/30/2020 Discharge date: 08/31/2020  Admission Diagnoses:  Discharge Diagnoses:  Active Problems:   Internal carotid artery stenosis, right   Stenosis of carotid artery   Discharged Condition: fair  Hospital Course: Caleb Powell is a 50 year old male who works as a Product manager, smokes 2 packs of cigarettes per day, has an anxiety disorder which requires angiolytics he has been treated recently for persistent hypertension is proven refractory to outpatient treatment.  08/29/2020 he noticed a blind spot in his right eye at which time he presented to [facility] for further evaluation and treatment.  CT of the head was negative and MRI revealed stenosis of the right carotid artery.  His anxiety drove him to leave the hospital and independent and represent for possible interventions.  Transfer to [facility] currently emergency department.  Room 20 and is on the list for interventional radiology intervention for carotid stenosis on the right.  Of note he has an incidental finding of COVID-positive which is asymptomatic at this time.  8/26: s/p IR R ICA angioplasty with stenting. On asa/brillinta, intermittent pressors and cleviprex to maintain BP within tight range for 24 hours. Pt anxious and wanting to leave. D/w him his aki, procedure and need for monitoring closely with BP for 24hours. Describes some pain at sight of intervention.   Consults:  interventional radiology   Significant Diagnostic Studies: angiography: R ICA angioplasty  Treatments: procedures: R ICA angioplasty and stenting  Discharge Exam: Blood pressure 118/63, pulse (!) 48, temperature 98.6 F (37 C), temperature source Oral, resp. rate 16, height '5\' 8"'$  (1.727 m), weight 73.4 kg, SpO2 90 %. See today's noted Disposition: Discharge disposition: 01-Home or Self Care     Pt has been cleared by IR  for d/c. Bp goals have been liberalized per them and he remains asymptomatic. He was advised to f/u with pcp and monitor BP closely Discharge Instructions     Diet - low sodium heart healthy   Complete by: As directed    Discharge instructions   Complete by: As directed    Follow up with pcp for repeat bmp within 1-2 weeks of d/c monitor BP closely at home as you resume your home medsications   Increase activity slowly   Complete by: As directed    No wound care   Complete by: As directed       Allergies as of 08/31/2020       Reactions   Penicillins    Any "cillins"  Unknown allergy reaction.        Medication List     TAKE these medications    amLODipine 5 MG tablet Commonly known as: NORVASC Take 1 tablet (5 mg total) by mouth daily.   aspirin 81 MG chewable tablet Chew 1 tablet (81 mg total) by mouth daily. Start taking on: September 01, 2020   fluvastatin 20 MG capsule Commonly known as: LESCOL Take 1 capsule (20 mg total) by mouth at bedtime.   hydrochlorothiazide 12.5 MG capsule Commonly known as: MICROZIDE Take 12.5 mg by mouth daily.   LORazepam 0.5 MG tablet Commonly known as: ATIVAN Take 0.5 mg by mouth See admin instructions. Take 1/2 in the morning and 1/2 every evening What changed: Another medication with the same name was removed. Continue taking this medication, and follow the directions you see here.   lovastatin 10 MG tablet Commonly known as:  MEVACOR Take 10 mg by mouth daily.   omeprazole 40 MG capsule Commonly known as: PRILOSEC Take 1 capsule (40 mg total) by mouth 2 (two) times daily.   ramipril 5 MG capsule Commonly known as: ALTACE Take 5 mg by mouth daily.   ticagrelor 90 MG Tabs tablet Commonly known as: BRILINTA Take 1 tablet (90 mg total) by mouth 2 (two) times daily.         Signed: Audria Nine 08/31/2020, 1:10 PM

## 2020-08-31 NOTE — Progress Notes (Signed)
Referring Physician(s): Dr. Curly Shores  Supervising Physician: Pedro Earls  Patient Status:  [facility] - In-pt  Chief Complaint:  Carotid stenosis  Brief History:  Caleb Powell is a 50 y.o. male with medical issues including hypertension and tobacco use.  He developed loss of vision in his right eye a few days ago.   He described it as complete loss of vision followed by 'like a curtain' with loss of the top visual fields, followed by resolution.   He was seen at [facility] and had workup there. He came to [facility] for continuation of workup.  MRA showed critical (R)ICA stenosis as well as bilateral vert art stenosis (L)>(R).  He underwent carotid/cerbral angiography with stenting of the Right ICA yesterday by Dr. Karenann Cai  Subjective:  Doing ok. Wants to go home. States if he doesn't leave today he will leave AMA anyway.  Allergies: Penicillins  Medications: Prior to Admission medications   Medication Sig Start Date End Date Taking? Authorizing Provider  amLODipine (NORVASC) 5 MG tablet Take 1 tablet (5 mg total) by mouth daily. Patient not taking: No sig reported 04/17/20   Soyla Dryer, PA-C  fluvastatin (LESCOL) 20 MG capsule Take 1 capsule (20 mg total) by mouth at bedtime. Patient not taking: Reported on 08/28/2020 06/01/19   Soyla Dryer, PA-C  hydrochlorothiazide (MICROZIDE) 12.5 MG capsule Take 12.5 mg by mouth daily. 08/13/20   [provider]  LORazepam (ATIVAN PO) Take 1 tablet by mouth daily. Patient not taking: Reported on 08/28/2020    [provider]  LORazepam (ATIVAN) 0.5 MG tablet Take 0.5 mg by mouth See admin instructions. Take 1/2 in the morning and 1/2 every evening 08/13/20   [provider]  lovastatin (MEVACOR) 10 MG tablet Take 10 mg by mouth daily. Patient not taking: Reported on 08/28/2020 08/28/20   [provider]  omeprazole (PRILOSEC) 40 MG capsule Take 1 capsule (40 mg total) by mouth 2 (two)  times daily. 04/17/20   Soyla Dryer, PA-C  ramipril (ALTACE) 5 MG capsule Take 5 mg by mouth daily. 08/28/20   [provider]     Vital Signs: BP (!) 96/52   Pulse (!) 51   Temp 98.6 F (37 C) (Oral)   Resp 13   Ht 5' 8"  (1.727 m)   Wt 73.4 kg   SpO2 93%   BMI 24.60 kg/m   Physical Exam Constitutional:      Appearance: Normal appearance.  HENT:     Head: Normocephalic and atraumatic.  Eyes:     Extraocular Movements: Extraocular movements intact.  Cardiovascular:     Rate and Rhythm: Normal rate and regular rhythm.  Pulmonary:     Effort: Pulmonary effort is normal. No respiratory distress.  Abdominal:     Tenderness: There is no abdominal tenderness.  Musculoskeletal:        General: Normal range of motion.     Comments: Groin stick ok, no hematoma, mild ecchymosis (no change from yesterday)  Skin:    General: Skin is warm and dry.  Neurological:     General: No focal deficit present.     Mental Status: He is alert and oriented to person, place, and time.  Psychiatric:        Mood and Affect: Mood normal.        Behavior: Behavior normal.        Thought Content: Thought content normal.        Judgment: Judgment  normal.    Imaging: CT HEAD WO CONTRAST (5MM)  Result Date: 08/28/2020 CLINICAL DATA:  TIA, headache, vision loss in right eye EXAM: CT HEAD WITHOUT CONTRAST TECHNIQUE: Contiguous axial images were obtained from the base of the skull through the vertex without intravenous contrast. COMPARISON:  None. FINDINGS: Brain: No acute intracranial abnormality. Specifically, no hemorrhage, hydrocephalus, mass lesion, acute infarction, or significant intracranial injury. Vascular: No hyperdense vessel or unexpected calcification. Skull: No acute calvarial abnormality. Sinuses/Orbits: No acute findings Other: None IMPRESSION: No acute intracranial abnormality. Electronically Signed   By: Rolm Baptise M.D.   On: 08/28/2020 19:32   MR ANGIO HEAD WO  CONTRAST  Result Date: 08/29/2020 CLINICAL DATA:  Neuro deficit, acute, stroke suspected. EXAM: MRI HEAD WITHOUT CONTRAST MRA HEAD WITHOUT CONTRAST MRA NECK WITHOUT AND WITH CONTRAST TECHNIQUE: Multiplanar, multi-echo pulse sequences of the brain and surrounding structures were acquired without intravenous contrast. Angiographic images of the Circle of Willis were acquired using MRA technique without intravenous contrast. Angiographic images of the neck were acquired using MRA technique without and with intravenous contrast. Carotid stenosis measurements (when applicable) are obtained utilizing NASCET criteria, using the distal internal carotid diameter as the denominator. CONTRAST:  47m GADAVIST GADOBUTROL 1 MMOL/ML IV SOLN COMPARISON:  CT head 08/28/2020. FINDINGS: MRI HEAD FINDINGS Brain: No acute infarction, hemorrhage, hydrocephalus, extra-axial collection or mass lesion. No pathologic intracranial enhancement. 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 Additional mild scattered T2 hyperintensities in the white matter, nonspecific but most likely related to chronic microvascular ischemic disease. Vascular: See below. Skull and upper cervical spine: Diffuse T1 hypointensity of the visualized cervical bone marrow. Sinuses/Orbits: Mild paranasal sinus mucosal thickening with retention cysts and the left sphenoid sinus and inferior right maxillary sinus. No acute orbital findings. Other: No mastoid effusions. MRA HEAD FINDINGS Anterior circulation: Bilateral intracranial ICAs are patent with mild bilateral paraclinoid ICA narrowing. 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 Bilateral A2 ACAs are patent. Bilateral M1 MCAs are patent without hemodynamically significant stenosis. No proximal M2 branch occlusion. Small 2 mm superiorly  directed conical outpatching with vessel arising from the tip involving the right M1 MCA, compatible with infundibulum. Posterior circulation: Non dominant/small left vertebral artery with superimposed moderate intradural stenosis. Bilateral intradural vertebral arteries are patent. The basilar artery and bilateral posterior cerebral arteries are patent without proximal hemodynamically significant stenosis. Anatomic variants: Detailed above. MRA NECK FINDINGS Aortic arch: Great vessel origins are patent. Right carotid system: Moderate narrowing at the carotid bifurcation with severe short-segment stenosis of the proximal ICA (for example see series 1159, image 144; series 1146, image 6). The more distal ICA remains opacified. Left carotid system: Patent. Mild carotid bifurcation and ICA narrowing. Vertebral arteries: Right dominant. Moderate stenosis of the right vertebral artery origin with multifocal mild narrowing of the right vertebral artery. Multifocal severe stenosis of the left vertebral artery with suspected occlusion of the mid V2 segment with distal V2 reconstitution. IMPRESSION: MRI head: 1. No evidence of acute intracranial abnormality. 2. 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 3. Diffuse T1 hypointensity of the visualized cervical bone marrow. This finding is nonspecific but can be seen with chronic anemia, chronic hypoxia (such as in smokers), obesity, or less commonly a lymphoproliferative disorder. MRA neck: 1. Severe short-segment proximal  right ICA stenosis. 2. 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 3. Moderate right vertebral artery origin stenosis. MRA head: 1. No large vessel occlusion. 2. Small left vertebral artery with superimposed moderate intradural stenosis. Electronically Signed   By: Margaretha Sheffield M.D.   On: 08/29/2020  09:20   MR ANGIO NECK W WO CONTRAST  Result Date: 08/29/2020 CLINICAL DATA:  Neuro deficit, acute, stroke suspected. EXAM: MRI HEAD WITHOUT CONTRAST MRA HEAD WITHOUT CONTRAST MRA NECK WITHOUT AND WITH CONTRAST TECHNIQUE: Multiplanar, multi-echo pulse sequences of the brain and surrounding structures were acquired without intravenous contrast. Angiographic images of the Circle of Willis were acquired using MRA technique without intravenous contrast. Angiographic images of the neck were acquired using MRA technique without and with intravenous contrast. Carotid stenosis measurements (when applicable) are obtained utilizing NASCET criteria, using the distal internal carotid diameter as the denominator. CONTRAST:  44m GADAVIST GADOBUTROL 1 MMOL/ML IV SOLN COMPARISON:  CT head 08/28/2020. FINDINGS: MRI HEAD FINDINGS Brain: No acute infarction, hemorrhage, hydrocephalus, extra-axial collection or mass lesion. No pathologic intracranial enhancement. 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 Additional mild scattered T2 hyperintensities in the white matter, nonspecific but most likely related to chronic microvascular ischemic disease. Vascular: See below. Skull and upper cervical spine: Diffuse T1 hypointensity of the visualized cervical bone marrow. Sinuses/Orbits: Mild paranasal sinus mucosal thickening with retention cysts and the left sphenoid sinus and inferior right maxillary sinus. No acute orbital findings. Other: No mastoid effusions. MRA HEAD FINDINGS Anterior circulation: Bilateral intracranial ICAs are patent with mild bilateral paraclinoid ICA narrowing. 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 Bilateral A2 ACAs are patent. Bilateral M1 MCAs are patent without hemodynamically significant stenosis. No proximal M2 branch  occlusion. Small 2 mm superiorly directed conical outpatching with vessel arising from the tip involving the right M1 MCA, compatible with infundibulum. Posterior circulation: Non dominant/small left vertebral artery with superimposed moderate intradural stenosis. Bilateral intradural vertebral arteries are patent. The basilar artery and bilateral posterior cerebral arteries are patent without proximal hemodynamically significant stenosis. Anatomic variants: Detailed above. MRA NECK FINDINGS Aortic arch: Great vessel origins are patent. Right carotid system: Moderate narrowing at the carotid bifurcation with severe short-segment stenosis of the proximal ICA (for example see series 1159, image 144; series 1146, image 6). The more distal ICA remains opacified. Left carotid system: Patent. Mild carotid bifurcation and ICA narrowing. Vertebral arteries: Right dominant. Moderate stenosis of the right vertebral artery origin with multifocal mild narrowing of the right vertebral artery. Multifocal severe stenosis of the left vertebral artery with suspected occlusion of the mid V2 segment with distal V2 reconstitution. IMPRESSION: MRI head: 1. No evidence of acute intracranial abnormality. 2. 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 3. Diffuse T1 hypointensity of the visualized cervical bone marrow. This finding is nonspecific but can be seen with chronic anemia, chronic hypoxia (such as in smokers), obesity, or less commonly a lymphoproliferative disorder. MRA neck: 1. Severe short-segment proximal right ICA stenosis. 2. 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 3. Moderate right vertebral artery origin stenosis. MRA head: 1. No large vessel occlusion. 2. Small  left vertebral artery with superimposed moderate intradural stenosis. Electronically Signed   By:  Margaretha Sheffield M.D.   On: 08/29/2020 09:20   MR BRAIN WO CONTRAST  Result Date: 08/29/2020 CLINICAL DATA:  Neuro deficit, acute, stroke suspected. EXAM: MRI HEAD WITHOUT CONTRAST MRA HEAD WITHOUT CONTRAST MRA NECK WITHOUT AND WITH CONTRAST TECHNIQUE: Multiplanar, multi-echo pulse sequences of the brain and surrounding structures were acquired without intravenous contrast. Angiographic images of the Circle of Willis were acquired using MRA technique without intravenous contrast. Angiographic images of the neck were acquired using MRA technique without and with intravenous contrast. Carotid stenosis measurements (when applicable) are obtained utilizing NASCET criteria, using the distal internal carotid diameter as the denominator. CONTRAST:  48m GADAVIST GADOBUTROL 1 MMOL/ML IV SOLN COMPARISON:  CT head 08/28/2020. FINDINGS: MRI HEAD FINDINGS Brain: No acute infarction, hemorrhage, hydrocephalus, extra-axial collection or mass lesion. No pathologic intracranial enhancement. 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 Additional mild scattered T2 hyperintensities in the white matter, nonspecific but most likely related to chronic microvascular ischemic disease. Vascular: See below. Skull and upper cervical spine: Diffuse T1 hypointensity of the visualized cervical bone marrow. Sinuses/Orbits: Mild paranasal sinus mucosal thickening with retention cysts and the left sphenoid sinus and inferior right maxillary sinus. No acute orbital findings. Other: No mastoid effusions. MRA HEAD FINDINGS Anterior circulation: Bilateral intracranial ICAs are patent with mild bilateral paraclinoid ICA narrowing. 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 Bilateral A2 ACAs are patent. Bilateral M1 MCAs are patent without hemodynamically significant  stenosis. No proximal M2 branch occlusion. Small 2 mm superiorly directed conical outpatching with vessel arising from the tip involving the right M1 MCA, compatible with infundibulum. Posterior circulation: Non dominant/small left vertebral artery with superimposed moderate intradural stenosis. Bilateral intradural vertebral arteries are patent. The basilar artery and bilateral posterior cerebral arteries are patent without proximal hemodynamically significant stenosis. Anatomic variants: Detailed above. MRA NECK FINDINGS Aortic arch: Great vessel origins are patent. Right carotid system: Moderate narrowing at the carotid bifurcation with severe short-segment stenosis of the proximal ICA (for example see series 1159, image 144; series 1146, image 6). The more distal ICA remains opacified. Left carotid system: Patent. Mild carotid bifurcation and ICA narrowing. Vertebral arteries: Right dominant. Moderate stenosis of the right vertebral artery origin with multifocal mild narrowing of the right vertebral artery. Multifocal severe stenosis of the left vertebral artery with suspected occlusion of the mid V2 segment with distal V2 reconstitution. IMPRESSION: MRI head: 1. No evidence of acute intracranial abnormality. 2. 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 3. Diffuse T1 hypointensity of the visualized cervical bone marrow. This finding is nonspecific but can be seen with chronic anemia, chronic hypoxia (such as in smokers), obesity, or less commonly a lymphoproliferative disorder. MRA neck: 1. Severe short-segment proximal right ICA stenosis. 2. 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 3. Moderate right vertebral artery origin stenosis. MRA head: 1. No large vessel occlusion. 2. Small left vertebral artery with superimposed moderate intradural stenosis.  Electronically Signed   By: FMargaretha SheffieldM.D.   On: 08/29/2020 09:20   [place]  Result Date: 08/29/2020 CLINICAL DATA:  Hypertensive crisis EXAM: RENAL / URINARY TRACT ULTRASOUND COMPLETE  COMPARISON:  None. FINDINGS: Right Kidney: Renal measurements: 11.3 cm x 6.0 cm x 6.2 cm = volume: 218 mL. Echogenicity within normal limits. No mass or hydronephrosis visualized. Left Kidney: Renal measurements: 11.7 cm x 6.5 cm x 5.4 cm = volume: 213 mL. Echogenicity within normal limits. No mass or hydronephrosis visualized. Bladder: Appears normal for degree of bladder distention. Other: None. IMPRESSION: Normal renal ultrasound. Electronically Signed   By: Valetta Mole M.D.   On: 08/29/2020 11:07   IR INTRAVSC STENT CERV CAROTID W/EMB-PROT MOD SED  Result Date: 08/30/2020 INDICATION: 50 year old male with past medical history anxiety, hypertension and tobacco use who presented to [facility] after episode of amaurosis fugax on 08/29/2020. He underwent an MR angiogram of the head and neck that showed severe stenosis of the cervical right ICA as well as occlusion of the left vertebral. He left the hospital AMA on the same day and presented today to [facility] ED for follow-up imaging findings. Given his symptomatic severe right carotid stenosis, he was loaded on dual anti-platelet with aspirin and ticagrelor in anticipation to diagnostic cerebral angiogram and right carotid angioplasty and stenting. EXAM: ULTRASOUND-GUIDED VASCULAR ACCESS DIAGNOSTIC CEREBRAL ANGIOGRAM RIGHT CAROTID ANGIOPLASTY AND STENTING WITH CEREBRAL PROTECTION DEVICE COMPARISON:  MR angiogram of the head and neck August 29, 2020 MEDICATIONS: 5000 units of heparin IV ANESTHESIA/SEDATION: The procedure was performed under monitored anesthesia care. CONTRAST:  125 mL of Omnipaque 240 milligram/mL FLUOROSCOPY TIME:  Fluoroscopy Time: 15 minutes 21 seconds (469 mGy). COMPLICATIONS: None immediate. TECHNIQUE: Informed written consent was  obtained from the patient after a thorough discussion of the procedural risks, benefits and alternatives. All questions were addressed. Maximal Sterile Barrier Technique was utilized including caps, mask, sterile gowns, sterile gloves, sterile drape, hand hygiene and skin antiseptic. A timeout was performed prior to the initiation of the procedure. The right groin was prepped and draped in the usual sterile fashion. Using a micropuncture kit and the modified Seldinger technique, access was gained to the right common femoral artery and a 5 French sheath was placed. Real-time ultrasound guidance was utilized for vascular access including the acquisition of a permanent ultrasound image documenting patency of the accessed vessel. Under fluoroscopy, a 5 [place] Berenstein 2 catheter was navigated over a 0.035" Terumo Glidewire into the aortic arch. The catheter was placed into the left subclavian artery. Frontal angiogram of the neck was obtained. The catheter was then advanced into the left vertebral artery. Frontal and lateral angiograms of the neck were obtained. Next, the catheter was placed into the left common carotid artery. Frontal and lateral angiograms neck obtained. Under biplane roadmap, the catheter was advanced into the left internal carotid artery. Frontal and lateral angiograms of the head obtained. The catheter was subsequently navigated into the right subclavian artery. Frontal angiogram of the neck was obtained. On the roadmap guidance, the catheter was advanced into the right vertebral artery. Frontal and lateral angiogram of the head were obtained. The catheter was then placed into the right common carotid artery. Frontal and lateral angiograms of the neck were obtained followed by frontal and lateral angiograms of the head. FINDINGS: 1. The right common femoral artery has adequate caliber for vascular access. 2. Diffuse luminal irregularity of the cervical left vertebral artery with multifocal areas  of stenosis at V1 and proximal V2 segment and occlusion of the mid V2 segment at the level of the V4 vertebral body. Reconstitution at the level of the C3 vertebral body from ascending cervical branches.  3. Mild atherosclerotic changes of the left carotid bifurcation without hemodynamically significant stenosis. 4. Brisk contrast opacification of the left MCA vascular tree on left internal carotid artery angiograms noting severely hypoplastic left A1/ACA segment. 5. Normal course and caliber of the cervical right vertebral artery. Cranial angiograms showed brisk opacification of the right vertebral artery, basilar artery and bilateral posterior cerebral arteries as well as distal cervical segment of the left artery and left PICA via contrast reflux mild luminal irregularity of the basilar artery and bilateral distal P2/PCA segments without hemodynamically significant stenosis. 6. Calcified plaque at the right carotid bifurcation resulting in approximately 40% stenosis. However, approximately 80% stenosis is seen at the cervical right ICA just distal to the bulb. 7. Contrast opacification of the right MCA and bilateral ACA vascular tree is seen on right common carotid artery angiograms. PROCEDURE: Frontal, lateral and bilateral oblique angiograms of the neck were obtained. The Berenstein 2 catheter and the 5 French femoral sheath were exchanged over a 0.035 inch Terumo Glidewire for a 6 [place] shuttle sheath which was placed in the distal right common carotid artery. Frontal and lateral angiograms of the neck were obtained. Under biplane roadmap, a 4-7 mm Emboshield nav 6 cerebral protection device was navigated into the distal cervical segment of the right ICA. Then, a 4 x 20 mm Viatrac balloon was navigated into the right ICA, at the level of occlusion. Angioplasty was performed under fluoroscopy. The balloon was removed and a 6-8 x 40 mm Xact carotid stent was deployed spanning the distal right common carotid  artery and proximal right ICA, across both the 40% and 80% stenoses. Subsequently, a 5 x 30 mm Viatrac balloon was navigated into the recently deployed stent. In stent angioplasty was performed under fluoroscopy. The balloon was removed and the cerebral protection device was recaptured. Right common carotid artery angiograms with frontal and lateral views of the neck showed excellent anterograde flow with resolution of stenosis. Right internal carotid artery angiograms with frontal and lateral views of the head showed no evidence of thromboembolic complication. Delay angiograms of the right common carotid artery with frontal and lateral views of the neck showed no evidence of in stent clot formation. The shuttle sheath was then retracted into the right external iliac artery. Angiogram was performed in right anterior oblique view. The access is at the level of the right common femoral artery which has adequate caliber for closure device. The shuttle sheath was exchanged over the wire for an 8 [place] Angio-Seal which was utilized for access closure. Immediate hemostasis was achieved. IMPRESSION: 1. 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 2. 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 3. Normal caliber of the cervical and intracranial right vertebral artery. 4. No hemodynamically significant stenosis of the left carotid bifurcation. PLAN: 1. Continue on dual anti-platelet therapy with ticagrelor and aspirin. 2. Follow-up carotid duplex in 3 months. Electronically Signed   By: Pedro Earls M.D.   On: 08/30/2020 17:10   IR US Guide Vasc Access Right  Result Date: 08/30/2020 INDICATION: 50 year old male with past medical history anxiety, hypertension and tobacco use who presented to [facility] after episode of amaurosis fugax on  08/29/2020. He underwent an MR angiogram of the head and neck that showed severe stenosis of the cervical right ICA as well as occlusion of the left vertebral. He  left the hospital AMA on the same day and presented today to [facility] ED for follow-up imaging findings. Given his symptomatic severe right carotid stenosis, he was loaded on dual anti-platelet with aspirin and ticagrelor in anticipation to diagnostic cerebral angiogram and right carotid angioplasty and stenting. EXAM: ULTRASOUND-GUIDED VASCULAR ACCESS DIAGNOSTIC CEREBRAL ANGIOGRAM RIGHT CAROTID ANGIOPLASTY AND STENTING WITH CEREBRAL PROTECTION DEVICE COMPARISON:  MR angiogram of the head and neck August 29, 2020 MEDICATIONS: 5000 units of heparin IV ANESTHESIA/SEDATION: The procedure was performed under monitored anesthesia care. CONTRAST:  125 mL of Omnipaque 240 milligram/mL FLUOROSCOPY TIME:  Fluoroscopy Time: 15 minutes 21 seconds (469 mGy). COMPLICATIONS: None immediate. TECHNIQUE: Informed written consent was obtained from the patient after a thorough discussion of the procedural risks, benefits and alternatives. All questions were addressed. Maximal Sterile Barrier Technique was utilized including caps, mask, sterile gowns, sterile gloves, sterile drape, hand hygiene and skin antiseptic. A timeout was performed prior to the initiation of the procedure. The right groin was prepped and draped in the usual sterile fashion. Using a micropuncture kit and the modified Seldinger technique, access was gained to the right common femoral artery and a 5 French sheath was placed. Real-time ultrasound guidance was utilized for vascular access including the acquisition of a permanent ultrasound image documenting patency of the accessed vessel. Under fluoroscopy, a 5 [place] Berenstein 2 catheter was navigated over a 0.035" Terumo Glidewire into the aortic arch. The catheter was placed into the left subclavian artery. Frontal angiogram of the neck was obtained.  The catheter was then advanced into the left vertebral artery. Frontal and lateral angiograms of the neck were obtained. Next, the catheter was placed into the left common carotid artery. Frontal and lateral angiograms neck obtained. Under biplane roadmap, the catheter was advanced into the left internal carotid artery. Frontal and lateral angiograms of the head obtained. The catheter was subsequently navigated into the right subclavian artery. Frontal angiogram of the neck was obtained. On the roadmap guidance, the catheter was advanced into the right vertebral artery. Frontal and lateral angiogram of the head were obtained. The catheter was then placed into the right common carotid artery. Frontal and lateral angiograms of the neck were obtained followed by frontal and lateral angiograms of the head. FINDINGS: 1. The right common femoral artery has adequate caliber for vascular access. 2. Diffuse luminal irregularity of the cervical left vertebral artery with multifocal areas of stenosis at V1 and proximal V2 segment and occlusion of the mid V2 segment at the level of the V4 vertebral body. Reconstitution at the level of the C3 vertebral body from ascending cervical branches. 3. Mild atherosclerotic changes of the left carotid bifurcation without hemodynamically significant stenosis. 4. Brisk contrast opacification of the left MCA vascular tree on left internal carotid artery angiograms noting severely hypoplastic left A1/ACA segment. 5. Normal course and caliber of the cervical right vertebral artery. Cranial angiograms showed brisk opacification of the right vertebral artery, basilar artery and bilateral posterior cerebral arteries as well as distal cervical segment of the left artery and left PICA via contrast reflux mild luminal irregularity of the basilar artery and bilateral distal P2/PCA segments without hemodynamically significant stenosis. 6. Calcified plaque at the right carotid bifurcation resulting in  approximately 40% stenosis. However, approximately 80% stenosis is seen at the cervical right ICA just distal to the bulb. 7. Contrast opacification of the right MCA and bilateral ACA vascular tree is seen on right common carotid artery angiograms. PROCEDURE: Frontal, lateral and bilateral  oblique angiograms of the neck were obtained. The Berenstein 2 catheter and the 5 French femoral sheath were exchanged over a 0.035 inch Terumo Glidewire for a 6 [place] shuttle sheath which was placed in the distal right common carotid artery. Frontal and lateral angiograms of the neck were obtained. Under biplane roadmap, a 4-7 mm Emboshield nav 6 cerebral protection device was navigated into the distal cervical segment of the right ICA. Then, a 4 x 20 mm Viatrac balloon was navigated into the right ICA, at the level of occlusion. Angioplasty was performed under fluoroscopy. The balloon was removed and a 6-8 x 40 mm Xact carotid stent was deployed spanning the distal right common carotid artery and proximal right ICA, across both the 40% and 80% stenoses. Subsequently, a 5 x 30 mm Viatrac balloon was navigated into the recently deployed stent. In stent angioplasty was performed under fluoroscopy. The balloon was removed and the cerebral protection device was recaptured. Right common carotid artery angiograms with frontal and lateral views of the neck showed excellent anterograde flow with resolution of stenosis. Right internal carotid artery angiograms with frontal and lateral views of the head showed no evidence of thromboembolic complication. Delay angiograms of the right common carotid artery with frontal and lateral views of the neck showed no evidence of in stent clot formation. The shuttle sheath was then retracted into the right external iliac artery. Angiogram was performed in right anterior oblique view. The access is at the level of the right common femoral artery which has adequate caliber for closure device. The  shuttle sheath was exchanged over the wire for an 8 [place] Angio-Seal which was utilized for access closure. Immediate hemostasis was achieved. IMPRESSION: 1. 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 2. 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 3. Normal caliber of the cervical and intracranial right vertebral artery. 4. No hemodynamically significant stenosis of the left carotid bifurcation. PLAN: 1. Continue on dual anti-platelet therapy with ticagrelor and aspirin. 2. Follow-up carotid duplex in 3 months. Electronically Signed   By: Pedro Earls M.D.   On: 08/30/2020 17:10   DG Chest Port 1 View  Result Date: 08/28/2020 CLINICAL DATA:  Chest pain, dyspnea EXAM: PORTABLE CHEST 1 VIEW COMPARISON:  None. FINDINGS: Lungs are clear. No pneumothorax or pleural effusion. Cardiac size within normal limits. Pulmonary vascularity is normal. Healed left mid clavicular fracture noted. No acute bone abnormality IMPRESSION: No active disease. Electronically Signed   By: Fidela Salisbury M.D.   On: 08/28/2020 19:28   ECHOCARDIOGRAM COMPLETE  Result Date: 08/29/2020    ECHOCARDIOGRAM REPORT   Patient Name:   Caleb Powell Date of Exam: 08/29/2020 Medical Rec #:  [id number]    Height:       69.0 in Accession #:    [id number]   Weight:       175.0 lb Date of Birth:  Dec 24, 1970     BSA:          1.952 m Patient Age:    26 years     BP:           146/70 mmHg Patient Gender: M            HR:           62 bpm. Exam Location:  [name] Procedure: 2D Echo, Cardiac Doppler and Color  Doppler Indications:    TIA  History:        Patient has no prior history of Echocardiogram examinations.                 TIA, Signs/Symptoms:Chest Pain; Risk Factors:Hypertension and                 Current Smoker. COVID +.  Sonographer:    Wenda Low Referring Phys:  [id number] [place]  1. Left ventricular ejection fraction, by estimation, is 60 to 65%. The left ventricle has normal function. The left ventricle has no regional wall motion abnormalities. Left ventricular diastolic parameters were normal.  2. Right ventricular systolic function is normal. The right ventricular size is normal. Tricuspid regurgitation signal is inadequate for assessing PA pressure.  3. The mitral valve is normal in structure. No evidence of mitral valve regurgitation. No evidence of mitral stenosis.  4. The aortic valve is tricuspid. There is moderate calcification of the aortic valve. There is moderate thickening of the aortic valve. Aortic valve regurgitation is not visualized. No aortic stenosis is present. FINDINGS  Left Ventricle: Left ventricular ejection fraction, by estimation, is 60 to 65%. The left ventricle has normal function. The left ventricle has no regional wall motion abnormalities. The left ventricular internal cavity size was normal in size. There is  no left ventricular hypertrophy. Left ventricular diastolic parameters were normal. Right Ventricle: The right ventricular size is normal. No increase in right ventricular wall thickness. Right ventricular systolic function is normal. Tricuspid regurgitation signal is inadequate for assessing PA pressure. Left Atrium: Left atrial size was normal in size. Right Atrium: Right atrial size was normal in size. Pericardium: There is no evidence of pericardial effusion. Mitral Valve: The mitral valve is normal in structure. Mild mitral annular calcification. No evidence of mitral valve regurgitation. No evidence of mitral valve stenosis. MV peak gradient, 4.0 mmHg. The mean mitral valve gradient is 2.0 mmHg. Tricuspid Valve: The tricuspid valve is normal in structure. Tricuspid valve regurgitation is not demonstrated. No evidence of tricuspid stenosis. Aortic Valve: The aortic valve is tricuspid. There is moderate  calcification of the aortic valve. There is moderate thickening of the aortic valve. There is moderate aortic valve annular calcification. Aortic valve regurgitation is not visualized. No aortic stenosis is present. Aortic valve mean gradient measures 4.0 mmHg. Aortic valve peak gradient measures 8.9 mmHg. Aortic valve area, by VTI measures 2.41 cm. Pulmonic Valve: The pulmonic valve was not well visualized. Pulmonic valve regurgitation is not visualized. No evidence of pulmonic stenosis. Aorta: The aortic root is normal in size and structure. Venous: IVC is small, suggesting low RA pressure and hypovolemia. IAS/Shunts: No atrial level shunt detected by color flow Doppler.  LEFT VENTRICLE PLAX 2D LVIDd:         4.94 cm  Diastology LVIDs:         3.35 cm  LV e' medial:    9.37 cm/s LV PW:         1.06 cm  LV E/e' medial:  8.5 LV IVS:        0.91 cm  LV e' lateral:   11.60 cm/s LVOT diam:     2.00 cm  LV E/e' lateral: 6.9 LV SV:         75 LV SV Index:   39 LVOT Area:     3.14 cm  RIGHT VENTRICLE RV Basal diam:  2.76 cm RV Mid diam:    2.75 cm  LEFT ATRIUM             Index       RIGHT ATRIUM           Index LA diam:        3.90 cm 2.00 cm/m  RA Area:     15.90 cm LA Vol (A2C):   48.4 ml 24.79 ml/m RA Volume:   45.20 ml  23.15 ml/m LA Vol (A4C):   52.8 ml 27.05 ml/m LA Biplane Vol: 51.2 ml 26.23 ml/m  AORTIC VALVE AV Area (Vmax):    2.05 cm AV Area (Vmean):   2.06 cm AV Area (VTI):     2.41 cm AV Vmax:           149.00 cm/s AV Vmean:          94.700 cm/s AV VTI:            0.313 m AV Peak Grad:      8.9 mmHg AV Mean Grad:      4.0 mmHg LVOT Vmax:         97.30 cm/s LVOT Vmean:        62.200 cm/s LVOT VTI:          0.240 m LVOT/AV VTI ratio: 0.77  AORTA Ao Root diam: 2.80 cm MITRAL VALVE MV Area (PHT): 3.79 cm    SHUNTS MV Area VTI:   2.51 cm    Systemic VTI:  0.24 m MV Peak grad:  4.0 mmHg    Systemic Diam: 2.00 cm MV Mean grad:  2.0 mmHg MV Vmax:       1.00 m/s MV Vmean:      58.1 cm/s MV Decel Time: 200  msec MV E velocity: 80.10 cm/s MV A velocity: 78.00 cm/s MV E/A ratio:  1.03 Carlyle Dolly MD Electronically signed by Carlyle Dolly MD Signature Date/Time: 08/29/2020/12:08:14 PM    Final    IR ANGIO INTRA EXTRACRAN SEL INTERNAL CAROTID BILAT MOD SED  Result Date: 08/30/2020 INDICATION: 50 year old male with past medical history anxiety, hypertension and tobacco use who presented to [facility] after episode of amaurosis fugax on 08/29/2020. He underwent an MR angiogram of the head and neck that showed severe stenosis of the cervical right ICA as well as occlusion of the left vertebral. He left the hospital AMA on the same day and presented today to [facility] ED for follow-up imaging findings. Given his symptomatic severe right carotid stenosis, he was loaded on dual anti-platelet with aspirin and ticagrelor in anticipation to diagnostic cerebral angiogram and right carotid angioplasty and stenting. EXAM: ULTRASOUND-GUIDED VASCULAR ACCESS DIAGNOSTIC CEREBRAL ANGIOGRAM RIGHT CAROTID ANGIOPLASTY AND STENTING WITH CEREBRAL PROTECTION DEVICE COMPARISON:  MR angiogram of the head and neck August 29, 2020 MEDICATIONS: 5000 units of heparin IV ANESTHESIA/SEDATION: The procedure was performed under monitored anesthesia care. CONTRAST:  125 mL of Omnipaque 240 milligram/mL FLUOROSCOPY TIME:  Fluoroscopy Time: 15 minutes 21 seconds (469 mGy). COMPLICATIONS: None immediate. TECHNIQUE: Informed written consent was obtained from the patient after a thorough discussion of the procedural risks, benefits and alternatives. All questions were addressed. Maximal Sterile Barrier Technique was utilized including caps, mask, sterile gowns, sterile gloves, sterile drape, hand hygiene and skin antiseptic. A timeout was performed prior to the initiation of the procedure. The right groin was prepped and draped in the usual sterile fashion. Using a micropuncture kit and the modified Seldinger technique, access was gained to  the right common femoral artery and a 5  French sheath was placed. Real-time ultrasound guidance was utilized for vascular access including the acquisition of a permanent ultrasound image documenting patency of the accessed vessel. Under fluoroscopy, a 5 [place] Berenstein 2 catheter was navigated over a 0.035" Terumo Glidewire into the aortic arch. The catheter was placed into the left subclavian artery. Frontal angiogram of the neck was obtained. The catheter was then advanced into the left vertebral artery. Frontal and lateral angiograms of the neck were obtained. Next, the catheter was placed into the left common carotid artery. Frontal and lateral angiograms neck obtained. Under biplane roadmap, the catheter was advanced into the left internal carotid artery. Frontal and lateral angiograms of the head obtained. The catheter was subsequently navigated into the right subclavian artery. Frontal angiogram of the neck was obtained. On the roadmap guidance, the catheter was advanced into the right vertebral artery. Frontal and lateral angiogram of the head were obtained. The catheter was then placed into the right common carotid artery. Frontal and lateral angiograms of the neck were obtained followed by frontal and lateral angiograms of the head. FINDINGS: 1. The right common femoral artery has adequate caliber for vascular access. 2. Diffuse luminal irregularity of the cervical left vertebral artery with multifocal areas of stenosis at V1 and proximal V2 segment and occlusion of the mid V2 segment at the level of the V4 vertebral body. Reconstitution at the level of the C3 vertebral body from ascending cervical branches. 3. Mild atherosclerotic changes of the left carotid bifurcation without hemodynamically significant stenosis. 4. Brisk contrast opacification of the left MCA vascular tree on left internal carotid artery angiograms noting severely hypoplastic left A1/ACA segment. 5. Normal course and caliber of the  cervical right vertebral artery. Cranial angiograms showed brisk opacification of the right vertebral artery, basilar artery and bilateral posterior cerebral arteries as well as distal cervical segment of the left artery and left PICA via contrast reflux mild luminal irregularity of the basilar artery and bilateral distal P2/PCA segments without hemodynamically significant stenosis. 6. Calcified plaque at the right carotid bifurcation resulting in approximately 40% stenosis. However, approximately 80% stenosis is seen at the cervical right ICA just distal to the bulb. 7. Contrast opacification of the right MCA and bilateral ACA vascular tree is seen on right common carotid artery angiograms. PROCEDURE: Frontal, lateral and bilateral oblique angiograms of the neck were obtained. The Berenstein 2 catheter and the 5 French femoral sheath were exchanged over a 0.035 inch Terumo Glidewire for a 6 [place] shuttle sheath which was placed in the distal right common carotid artery. Frontal and lateral angiograms of the neck were obtained. Under biplane roadmap, a 4-7 mm Emboshield nav 6 cerebral protection device was navigated into the distal cervical segment of the right ICA. Then, a 4 x 20 mm Viatrac balloon was navigated into the right ICA, at the level of occlusion. Angioplasty was performed under fluoroscopy. The balloon was removed and a 6-8 x 40 mm Xact carotid stent was deployed spanning the distal right common carotid artery and proximal right ICA, across both the 40% and 80% stenoses. Subsequently, a 5 x 30 mm Viatrac balloon was navigated into the recently deployed stent. In stent angioplasty was performed under fluoroscopy. The balloon was removed and the cerebral protection device was recaptured. Right common carotid artery angiograms with frontal and lateral views of the neck showed excellent anterograde flow with resolution of stenosis. Right internal carotid artery angiograms with frontal and lateral views of  the head showed no evidence of  thromboembolic complication. Delay angiograms of the right common carotid artery with frontal and lateral views of the neck showed no evidence of in stent clot formation. The shuttle sheath was then retracted into the right external iliac artery. Angiogram was performed in right anterior oblique view. The access is at the level of the right common femoral artery which has adequate caliber for closure device. The shuttle sheath was exchanged over the wire for an 8 [place] Angio-Seal which was utilized for access closure. Immediate hemostasis was achieved. IMPRESSION: 1. 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 2. 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 3. Normal caliber of the cervical and intracranial right vertebral artery. 4. No hemodynamically significant stenosis of the left carotid bifurcation. PLAN: 1. Continue on dual anti-platelet therapy with ticagrelor and aspirin. 2. Follow-up carotid duplex in 3 months. Electronically Signed   By: Pedro Earls M.D.   On: 08/30/2020 17:10   IR ANGIO VERTEBRAL SEL VERTEBRAL BILAT MOD SED  Result Date: 08/30/2020 INDICATION: 50 year old male with past medical history anxiety, hypertension and tobacco use who presented to [facility] after episode of amaurosis fugax on 08/29/2020. He underwent an MR angiogram of the head and neck that showed severe stenosis of the cervical right ICA as well as occlusion of the left vertebral. He left the hospital AMA on the same day and presented today to [facility] ED for follow-up imaging findings. Given his symptomatic severe right carotid stenosis, he was loaded on dual anti-platelet with aspirin and ticagrelor in anticipation to diagnostic cerebral angiogram and right carotid angioplasty and stenting. EXAM:  ULTRASOUND-GUIDED VASCULAR ACCESS DIAGNOSTIC CEREBRAL ANGIOGRAM RIGHT CAROTID ANGIOPLASTY AND STENTING WITH CEREBRAL PROTECTION DEVICE COMPARISON:  MR angiogram of the head and neck August 29, 2020 MEDICATIONS: 5000 units of heparin IV ANESTHESIA/SEDATION: The procedure was performed under monitored anesthesia care. CONTRAST:  125 mL of Omnipaque 240 milligram/mL FLUOROSCOPY TIME:  Fluoroscopy Time: 15 minutes 21 seconds (469 mGy). COMPLICATIONS: None immediate. TECHNIQUE: Informed written consent was obtained from the patient after a thorough discussion of the procedural risks, benefits and alternatives. All questions were addressed. Maximal Sterile Barrier Technique was utilized including caps, mask, sterile gowns, sterile gloves, sterile drape, hand hygiene and skin antiseptic. A timeout was performed prior to the initiation of the procedure. The right groin was prepped and draped in the usual sterile fashion. Using a micropuncture kit and the modified Seldinger technique, access was gained to the right common femoral artery and a 5 French sheath was placed. Real-time ultrasound guidance was utilized for vascular access including the acquisition of a permanent ultrasound image documenting patency of the accessed vessel. Under fluoroscopy, a 5 [place] Berenstein 2 catheter was navigated over a 0.035" Terumo Glidewire into the aortic arch. The catheter was placed into the left subclavian artery. Frontal angiogram of the neck was obtained. The catheter was then advanced into the left vertebral artery. Frontal and lateral angiograms of the neck were obtained. Next, the catheter was placed into the left common carotid artery. Frontal and lateral angiograms neck obtained. Under biplane roadmap, the catheter was advanced into the left internal carotid artery. Frontal and lateral angiograms of the head obtained. The catheter was subsequently navigated into the right subclavian artery. Frontal angiogram of the neck was  obtained. On the roadmap guidance, the catheter was advanced into the right vertebral  artery. Frontal and lateral angiogram of the head were obtained. The catheter was then placed into the right common carotid artery. Frontal and lateral angiograms of the neck were obtained followed by frontal and lateral angiograms of the head. FINDINGS: 1. The right common femoral artery has adequate caliber for vascular access. 2. Diffuse luminal irregularity of the cervical left vertebral artery with multifocal areas of stenosis at V1 and proximal V2 segment and occlusion of the mid V2 segment at the level of the V4 vertebral body. Reconstitution at the level of the C3 vertebral body from ascending cervical branches. 3. Mild atherosclerotic changes of the left carotid bifurcation without hemodynamically significant stenosis. 4. Brisk contrast opacification of the left MCA vascular tree on left internal carotid artery angiograms noting severely hypoplastic left A1/ACA segment. 5. Normal course and caliber of the cervical right vertebral artery. Cranial angiograms showed brisk opacification of the right vertebral artery, basilar artery and bilateral posterior cerebral arteries as well as distal cervical segment of the left artery and left PICA via contrast reflux mild luminal irregularity of the basilar artery and bilateral distal P2/PCA segments without hemodynamically significant stenosis. 6. Calcified plaque at the right carotid bifurcation resulting in approximately 40% stenosis. However, approximately 80% stenosis is seen at the cervical right ICA just distal to the bulb. 7. Contrast opacification of the right MCA and bilateral ACA vascular tree is seen on right common carotid artery angiograms. PROCEDURE: Frontal, lateral and bilateral oblique angiograms of the neck were obtained. The Berenstein 2 catheter and the 5 French femoral sheath were exchanged over a 0.035 inch Terumo Glidewire for a 6 [place] shuttle sheath which  was placed in the distal right common carotid artery. Frontal and lateral angiograms of the neck were obtained. Under biplane roadmap, a 4-7 mm Emboshield nav 6 cerebral protection device was navigated into the distal cervical segment of the right ICA. Then, a 4 x 20 mm Viatrac balloon was navigated into the right ICA, at the level of occlusion. Angioplasty was performed under fluoroscopy. The balloon was removed and a 6-8 x 40 mm Xact carotid stent was deployed spanning the distal right common carotid artery and proximal right ICA, across both the 40% and 80% stenoses. Subsequently, a 5 x 30 mm Viatrac balloon was navigated into the recently deployed stent. In stent angioplasty was performed under fluoroscopy. The balloon was removed and the cerebral protection device was recaptured. Right common carotid artery angiograms with frontal and lateral views of the neck showed excellent anterograde flow with resolution of stenosis. Right internal carotid artery angiograms with frontal and lateral views of the head showed no evidence of thromboembolic complication. Delay angiograms of the right common carotid artery with frontal and lateral views of the neck showed no evidence of in stent clot formation. The shuttle sheath was then retracted into the right external iliac artery. Angiogram was performed in right anterior oblique view. The access is at the level of the right common femoral artery which has adequate caliber for closure device. The shuttle sheath was exchanged over the wire for an 8 [place] Angio-Seal which was utilized for access closure. Immediate hemostasis was achieved. IMPRESSION: 1. 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 2. 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 3. Normal caliber of the cervical and intracranial right vertebral artery.  4. No hemodynamically significant stenosis of the left  carotid bifurcation. PLAN: 1. Continue on dual anti-platelet therapy with ticagrelor and aspirin. 2. Follow-up carotid duplex in 3 months. Electronically Signed   By: Pedro Earls M.D.   On: 08/30/2020 17:10    Labs:  CBC: Recent Labs    08/12/20 0058 08/28/20 1859 08/30/20 0730 08/31/20 0452  WBC 11.2* 12.5* 5.9 10.4  HGB 15.4 16.0 15.4 13.4  HCT 46.5 47.6 46.8 39.1  PLT 275 270 226 204    COAGS: Recent Labs    08/28/20 1859 08/30/20 1031  INR 1.1 1.0  APTT  --  33    BMP: Recent Labs    08/12/20 0058 08/28/20 1859 08/30/20 0730 08/31/20 0452  NA 140 136 137 137  K 3.8 3.5 3.4* 3.5  CL 104 101 103 103  CO2 26 26 25  20*  GLUCOSE 110* 94 94 88  BUN 18 16 17 16   CALCIUM 8.8* 9.1 8.7* 8.3*  CREATININE 1.11 0.98 1.04 1.21  GFRNONAA >60 >60 >60 >60    LIVER FUNCTION TESTS: Recent Labs    03/02/20 1124 08/12/20 0058 08/28/20 1859  BILITOT 0.6 0.6 0.5  AST 18 18 15   ALT 20 18 18   ALKPHOS 61 87 81  PROT 7.5 7.3 7.9  ALBUMIN 4.6 4.5 4.8    Assessment and Plan:  Carotid stenosis - S/P RIGHT CAROTID ANGIOPLASTY AND STENTING WITH CEREBRAL PROTECTION DEVICE By Dr. Karenann Cai.  SBP should be between 90- 160.   Ok to discharge from [employer] standpoint.  Recommend Brilinta 90 mg BID and 81 mg Aspirin daily.  If patient cannot afford Brilinta, he can visit this web site for information and possible coupon = [email or web address]ee  If he still cannot obtain Brilinta, will need to convert to Plavix instead.  He will have a carotid doppler arranged in 3 months. IR will call patient with appointment detail.  Electronically Signed: Murrell Redden, PA-C 08/31/2020, 12:29 PM    I spent a total of 25 Minutes at the the patient's bedside AND on the patient's hospital floor or unit, greater than 50% of which was counseling/coordinating care for f/u carotid  stent.

## 2020-09-13 ENCOUNTER — Telehealth (HOSPITAL_COMMUNITY): Payer: Self-pay | Admitting: Pharmacist

## 2020-09-13 NOTE — Telephone Encounter (Signed)
Pharmacy Transitions of Care Follow-up Telephone Call  Date of discharge: 08/31/2020  Discharge Diagnosis: TIA  How have you been since you were released from the hospital? Good   Medication changes made at discharge:  - START: Brillinta, low dose asa  - STOPPED: None  - CHANGED: lorazepam  Medication changes verified by the patient? Yes (Yes/No)    Medication Accessibility:  Home Pharmacy: N/A   Was the patient provided with refills on discharged medications? No     Medication Review: TICAGRELOR (BRILINTA) Ticagrelor 90 mg BID initiated on 08/31/2020.  - Educated patient on expected duration of therapy of aspirin with ticagrelor. Advised patient that dose of ticagrelor will be reduced after . Aspirin will be continued indefinitely/discontinued. - Discussed importance of taking medication around the same time every day, - Reviewed potential DDIs with patient - Advised patient of medications to avoid (NSAIDs, aspirin maintenance doses>100 mg daily) - Educated that Tylenol (acetaminophen) will be the preferred analgesic to prevent risk of bleeding  - Emphasized importance of monitoring for signs and symptoms of bleeding (abnormal bruising, prolonged bleeding, nose bleeds, bleeding from gums, discolored urine, black tarry stools)  - Educated patient to notify doctor if shortness of breath or abnormal heartbeat occur - Advised patient to alert all providers of antiplatelet therapy prior to starting a new medication or having a procedure   Follow-up Appointments:  PCP Hospital f/u appt confirmed? Pt reports he needs to schedule.  [facility] f/u appt confirmed? Pt reports he needs to schedule.    If their condition worsens, is the pt aware to call PCP or go to the Emergency Dept.? Yes  Final Patient Assessment: Patient reports he feels better than he has in a very long time.  His bruised area continues to heal and his blood pressure has stabilized.  Reviewed medications  with patient.

## 2020-10-04 ENCOUNTER — Ambulatory Visit: Payer: Self-pay | Admitting: "Endocrinology

## 2020-10-17 ENCOUNTER — Ambulatory Visit: Payer: Medicaid Other | Admitting: Gastroenterology

## 2020-11-27 ENCOUNTER — Other Ambulatory Visit: Payer: Self-pay | Admitting: Radiology

## 2020-11-27 MED ORDER — TICAGRELOR 90 MG PO TABS: 90.0000 mg | ORAL_TABLET | Freq: Two times a day (BID) | ORAL | 5 refills | Status: DC

## 2020-12-05 ENCOUNTER — Other Ambulatory Visit (HOSPITAL_COMMUNITY): Payer: Self-pay | Admitting: Interventional Radiology

## 2020-12-05 ENCOUNTER — Other Ambulatory Visit: Payer: Self-pay

## 2020-12-05 ENCOUNTER — Ambulatory Visit: Payer: Medicaid Other | Admitting: Internal Medicine

## 2020-12-05 ENCOUNTER — Encounter: Payer: Self-pay | Admitting: Internal Medicine

## 2020-12-05 VITALS — BP 161/79 | HR 78 | Temp 97.3°F | Ht 69.0 in | Wt 170.4 lb

## 2020-12-05 DIAGNOSIS — Z1211 Encounter for screening for malignant neoplasm of colon: Secondary | ICD-10-CM | POA: Diagnosis not present

## 2020-12-05 DIAGNOSIS — K219 Gastro-esophageal reflux disease without esophagitis: Secondary | ICD-10-CM | POA: Diagnosis not present

## 2020-12-05 DIAGNOSIS — I771 Stricture of artery: Secondary | ICD-10-CM

## 2020-12-05 DIAGNOSIS — J312 Chronic pharyngitis: Secondary | ICD-10-CM

## 2020-12-05 NOTE — Patient Instructions (Signed)
We will schedule you for upper endoscopy to further evaluate your throat pain and soreness.  At the same time we will perform colonoscopy for colon cancer screening purposes.  You will need to be off your Brilinta for these procedures.  After you reach out to your physician in regards to this, then please let us know.  It was nice meeting both you today.  Dr. Abbey Chatters  At [facility] we value your feedback. You may receive a survey about your visit today. Please share your experience as we strive to create trusting relationships with our patients to provide genuine, compassionate, quality care.  We appreciate your understanding and patience as we review any laboratory studies, imaging, and other diagnostic tests that are ordered as we care for you. Our office policy is 5 business days for review of these results, and any emergent or urgent results are addressed in a timely manner for your best interest. If you do not hear from our office in 1 week, please contact us.   We also encourage the use of MyChart, which contains your medical information for your review as well. If you are not enrolled in this feature, an access code is on this after visit summary for your convenience. Thank you for allowing [name] to be involved in your care.  It was great to see you today!  I hope you have a great rest of your Fall!    Caleb Powell. Abbey Chatters, D.O. Gastroenterology and Hepatology [facility]

## 2020-12-05 NOTE — Progress Notes (Signed)
Primary Care Physician:  Leslie Andrea, MD Primary Gastroenterologist:  Dr. Abbey Chatters  Chief Complaint  Patient presents with   Abdominal Pain    occ    HPI:   Caleb Powell is a 50 y.o. male who presents to clinic today by referral from his PCP Dr. Karie Kirks for evaluation.  He has numerous GI complaints for me today.  States for over a year he has had burning in his throat as well as pain.  States this radiates from the bottom of his neck up to his jaw all.  Severe at times.  Intermittent in nature.  Has been on omeprazole 40 mg twice daily for over a year which he states does not help.    Was evaluated by ENT with normal flexible laryngoscopy.  Subsequently underwent CT neck with contrast which showed mild asymmetry of the vocal cords otherwise unremarkable.  Also had his thyroid evaluated which was unremarkable.  No dysphagia or odynophagia.  No chronic NSAID use.  No previous upper endoscopy or colonoscopy.  No family history of colorectal malignancy.  No melena hematochezia.  No change in bowel movements.  Past Medical History:  Diagnosis Date   Hypertension     Past Surgical History:  Procedure Laterality Date   IR ANGIO INTRA EXTRACRAN SEL INTERNAL CAROTID BILAT MOD SED  08/30/2020   IR ANGIO VERTEBRAL SEL VERTEBRAL BILAT MOD SED  08/30/2020   IR INTRAVSC STENT CERV CAROTID W/EMB-PROT MOD SED INCL ANGIO  08/30/2020   IR US GUIDE VASC ACCESS RIGHT  08/30/2020   RADIOLOGY WITH ANESTHESIA N/A 08/30/2020   Procedure: IR WITH ANESTHESIA;  Surgeon: Pedro Earls, MD;  Location: [facility];  Service: Radiology;  Laterality: N/A;    Current Outpatient Medications  Medication Sig Dispense Refill   aspirin 81 MG chewable tablet Chew 1 tablet (81 mg total) by mouth daily. 30 tablet 0   hydrochlorothiazide (MICROZIDE) 12.5 MG capsule Take 12.5 mg by mouth daily.     LORazepam (ATIVAN) 0.5 MG tablet Take 0.25 mg by mouth daily.     lovastatin (MEVACOR) 10 MG tablet Take 10  mg by mouth daily.     omeprazole (PRILOSEC) 40 MG capsule Take 1 capsule (40 mg total) by mouth 2 (two) times daily. 60 capsule 3   ramipril (ALTACE) 5 MG capsule Take 5 mg by mouth daily.     ticagrelor (BRILINTA) 90 MG TABS tablet Take 1 tablet (90 mg total) by mouth 2 (two) times daily. 60 tablet 5   amLODipine (NORVASC) 5 MG tablet Take 1 tablet (5 mg total) by mouth daily. (Patient not taking: Reported on 08/28/2020) 30 tablet 3   fluvastatin (LESCOL) 20 MG capsule Take 1 capsule (20 mg total) by mouth at bedtime. (Patient not taking: Reported on 08/28/2020) 30 capsule 4   No current facility-administered medications for this visit.    Allergies as of 12/05/2020 - Review Complete 12/05/2020  Allergen Reaction Noted   Penicillins  03/16/2014    Family History  Problem Relation Age of Onset   Diabetes Mother    Heart disease Mother    Stroke Mother    Hypertension Maternal Grandmother    Hypertension Maternal Grandfather    Heart disease Father     Social History   Socioeconomic History   Marital status: Legally Separated    Spouse name: Not on file   Number of children: Not on file   Years of education: Not on file   Highest  education level: Not on file  Occupational History   Not on file  Tobacco Use   Smoking status: Every Day    Packs/day: 2.50    Years: 36.00    Pack years: 90.00    Types: Cigarettes   Smokeless tobacco: Former  Scientific laboratory technician Use: Former   Devices: used for about one month  Substance and Sexual Activity   Alcohol use: Yes    Comment: rare   Drug use: No   Sexual activity: Yes    Birth control/protection: None  Other Topics Concern   Not on file  Social History Narrative   Not on file   Social Determinants of Health   Financial Resource Strain: Not on file  Food Insecurity: Not on file  Transportation Needs: Not on file  Physical Activity: Not on file  Stress: Not on file  Social Connections: Not on file  Intimate Partner  Violence: Not on file    Subjective: Review of Systems  Constitutional:  Negative for chills and fever.  HENT:  Positive for sore throat. Negative for congestion and hearing loss.   Eyes:  Negative for blurred vision and double vision.  Respiratory:  Negative for cough and shortness of breath.   Cardiovascular:  Negative for chest pain and palpitations.  Gastrointestinal:  Positive for heartburn. Negative for abdominal pain, blood in stool, constipation, diarrhea, melena and vomiting.  Genitourinary:  Negative for dysuria and urgency.  Musculoskeletal:  Negative for joint pain and myalgias.  Skin:  Negative for itching and rash.  Neurological:  Negative for dizziness and headaches.  Psychiatric/Behavioral:  Negative for depression. The patient is not nervous/anxious.       Objective: BP (!) 161/79   Pulse 78   Temp (!) 97.3 F (36.3 C) (Temporal)   Ht 5\' 9"  (1.753 m)   Wt 170 lb 6.4 oz (77.3 kg)   BMI 25.16 kg/m  Physical Exam Constitutional:      Appearance: Normal appearance.  HENT:     Head: Normocephalic and atraumatic.  Eyes:     Extraocular Movements: Extraocular movements intact.     Conjunctiva/sclera: Conjunctivae normal.  Cardiovascular:     Rate and Rhythm: Normal rate and regular rhythm.  Pulmonary:     Effort: Pulmonary effort is normal.     Breath sounds: Normal breath sounds.  Abdominal:     General: Bowel sounds are normal.     Palpations: Abdomen is soft.  Musculoskeletal:        General: Normal range of motion.     Cervical back: Normal range of motion and neck supple.  Skin:    General: Skin is warm.  Neurological:     General: No focal deficit present.     Mental Status: He is alert and oriented to person, place, and time.  Psychiatric:        Mood and Affect: Mood normal.        Behavior: Behavior normal.     Assessment: *Heartburn *Chronic sore throat *Colon cancer screening  Plan: Etiology of patient's symptoms unclear.  He is  already been evaluated by ENT as well as thyroid evaluation.  At this point I think it would be reasonable to proceed with EGD to evaluate for peptic ulcer disease, esophagitis, gastritis, H. Pylori, duodenitis, or other. Will also evaluate for esophageal stricture, Schatzki's ring, esophageal web or other.   At the same time we will perform colonoscopy for colon cancer screening purposes.  The risks including  infection, bleed, or perforation as well as benefits, limitations, alternatives and imponderables have been reviewed with the patient. Potential for esophageal dilation, biopsy, etc. have also been reviewed.  Questions have been answered. All parties agreeable.  Patient states he was told he only has to stay on Brilinta for 3 months after carotid stent.  He will reach out to interventional radiology to confirm this and let us know.  Thank you Dr. Karie Kirks for the kind referral.  12/05/2020 1:36 PM   Disclaimer: This note was dictated with voice recognition software. Similar sounding words can inadvertently be transcribed and may not be corrected upon review.

## 2020-12-07 ENCOUNTER — Ambulatory Visit (HOSPITAL_COMMUNITY)
Admission: RE | Admit: 2020-12-07 | Discharge: 2020-12-07 | Disposition: A | Discharge status: Home / Self Care | Payer: Medicaid Other | Source: Ambulatory Visit | Attending: Interventional Radiology | Admitting: Interventional Radiology

## 2020-12-07 DIAGNOSIS — I771 Stricture of artery: Secondary | ICD-10-CM | POA: Diagnosis present

## 2020-12-10 ENCOUNTER — Telehealth (HOSPITAL_COMMUNITY): Payer: Self-pay

## 2020-12-10 NOTE — Telephone Encounter (Signed)
Pt agreed to f/u in 1 year with u/s carotid. AW 

## 2020-12-20 ENCOUNTER — Telehealth: Payer: Self-pay | Admitting: Internal Medicine

## 2020-12-20 NOTE — Telephone Encounter (Signed)
Called pt. He states he doesn't want to have colonoscopy done. He only wants EGD to be done. He reports he has been off brillinta x 1 week now. Please advise Dr. Abbey Chatters thanks

## 2020-12-20 NOTE — Telephone Encounter (Signed)
Pt said he was seen by Dr Abbey Chatters on 11/30 and was calling to schedule his EGD, but not a colonoscopy. Please advise. [phone]

## 2020-12-24 NOTE — Telephone Encounter (Signed)
Okay to schedule for EGD.  ASA 3.  Thank you

## 2020-12-24 NOTE — Telephone Encounter (Signed)
Called pt, EGD scheduled for 01/14/21 at 2:30pm. Orders entered.

## 2020-12-25 NOTE — Telephone Encounter (Signed)
Pre-op appt 01/11/21. Appt letter mailed with procedure instructions.

## 2021-01-08 NOTE — Patient Instructions (Signed)
[facility]  01/08/2021     @PREFPERIOPPHARMACY @   Your procedure is scheduled on  01/14/2021.   Report to [facility] at  1230  P.M.   Call this number if you have problems the morning of surgery:  [phone]   Remember:  Follow the diet instructions given to you by the office.    Take these medicines the morning of surgery with A SIP OF WATER                                    Ativan (if needed)     Do not wear jewelry, make-up or nail polish.  Do not wear lotions, powders, or perfumes, or deodorant.  Do not shave 48 hours prior to surgery.  Men may shave face and neck.  Do not bring valuables to the hospital.  [facility] is not responsible for any belongings or valuables.  Contacts, dentures or bridgework may not be worn into surgery.  Leave your suitcase in the car.  After surgery it may be brought to your room.  For patients admitted to the hospital, discharge time will be determined by your treatment team.  Patients discharged the day of surgery will not be allowed to drive home and must have someone with them for 24 hours.    Special instructions:   DO NOT smoke tobacco or vape for 24 hours before your procedure.  Please read over the following fact sheets that you were given. Anesthesia Post-op Instructions and Care and Recovery After Surgery      Upper Endoscopy, Adult, Care After This sheet gives you information about how to care for yourself after your procedure. Your health care provider may also give you more specific instructions. If you have problems or questions, contact your health care provider. What can I expect after the procedure? After the procedure, it is common to have: A sore throat. Mild stomach pain or discomfort. Bloating. Nausea. Follow these instructions at home:  Follow instructions from your health care provider about what to eat or drink after your procedure. Return to your normal activities as told by your health care  provider. Ask your health care provider what activities are safe for you. Take over-the-counter and prescription medicines only as told by your health care provider. If you were given a sedative during the procedure, it can affect you for several hours. Do not drive or operate machinery until your health care provider says that it is safe. Keep all follow-up visits as told by your health care provider. This is important. Contact a health care provider if you have: A sore throat that lasts longer than one day. Trouble swallowing. Get help right away if: You vomit blood or your vomit looks like coffee grounds. You have: A fever. Bloody, black, or tarry stools. A severe sore throat or you cannot swallow. Difficulty breathing. Severe pain in your chest or abdomen. Summary After the procedure, it is common to have a sore throat, mild stomach discomfort, bloating, and nausea. If you were given a sedative during the procedure, it can affect you for several hours. Do not drive or operate machinery until your health care provider says that it is safe. Follow instructions from your health care provider about what to eat or drink after your procedure. Return to your normal activities as told by your health care provider. This information is not  intended to replace advice given to you by your health care provider. Make sure you discuss any questions you have with your health care provider. Document Revised: 10/29/2018 Document Reviewed: 05/25/2017 Elsevier Patient Education  2022 [place] After This sheet gives you information about how to care for yourself after your procedure. Your health care provider may also give you more specific instructions. If you have problems or questions, contact your health care provider. What can I expect after the procedure? After the procedure, it is common to have: Tiredness. Forgetfulness about what happened after the  procedure. Impaired judgment for important decisions. Nausea or vomiting. Some difficulty with balance. Follow these instructions at home: For the time period you were told by your health care provider:   Rest as needed. Do not participate in activities where you could fall or become injured. Do not drive or use machinery. Do not drink alcohol. Do not take sleeping pills or medicines that cause drowsiness. Do not make important decisions or sign legal documents. Do not take care of children on your own. Eating and drinking Follow the diet that is recommended by your health care provider. Drink enough fluid to keep your urine pale yellow. If you vomit: Drink water, juice, or soup when you can drink without vomiting. Make sure you have little or no nausea before eating solid foods. General instructions Have a responsible adult stay with you for the time you are told. It is important to have someone help care for you until you are awake and alert. Take over-the-counter and prescription medicines only as told by your health care provider. If you have sleep apnea, surgery and certain medicines can increase your risk for breathing problems. Follow instructions from your health care provider about wearing your sleep device: Anytime you are sleeping, including during daytime naps. While taking prescription pain medicines, sleeping medicines, or medicines that make you drowsy. Avoid smoking. Keep all follow-up visits as told by your health care provider. This is important. Contact a health care provider if: You keep feeling nauseous or you keep vomiting. You feel light-headed. You are still sleepy or having trouble with balance after 24 hours. You develop a rash. You have a fever. You have redness or swelling around the IV site. Get help right away if: You have trouble breathing. You have new-onset confusion at home. Summary For several hours after your procedure, you may feel tired.  You may also be forgetful and have poor judgment. Have a responsible adult stay with you for the time you are told. It is important to have someone help care for you until you are awake and alert. Rest as told. Do not drive or operate machinery. Do not drink alcohol or take sleeping pills. Get help right away if you have trouble breathing, or if you suddenly become confused. This information is not intended to replace advice given to you by your health care provider. Make sure you discuss any questions you have with your health care provider. Document Revised: 09/08/2019 Document Reviewed: 11/25/2018 Elsevier Patient Education  2022 [employer].

## 2021-01-11 ENCOUNTER — Encounter (HOSPITAL_COMMUNITY): Payer: Self-pay

## 2021-01-11 ENCOUNTER — Encounter (HOSPITAL_COMMUNITY)
Admission: RE | Admit: 2021-01-11 | Discharge: 2021-01-11 | Disposition: A | Discharge status: Home / Self Care | Payer: Medicaid Other | Source: Ambulatory Visit | Attending: Internal Medicine | Admitting: Internal Medicine

## 2021-01-11 VITALS — BP 161/81 | HR 69 | Temp 98.5°F | Resp 18 | Ht 68.0 in | Wt 170.0 lb

## 2021-01-11 DIAGNOSIS — Z79899 Other long term (current) drug therapy: Secondary | ICD-10-CM | POA: Insufficient documentation

## 2021-01-11 DIAGNOSIS — Z01812 Encounter for preprocedural laboratory examination: Secondary | ICD-10-CM | POA: Insufficient documentation

## 2021-01-11 HISTORY — DX: Dyspnea, unspecified: R06.00

## 2021-01-11 HISTORY — DX: Gastro-esophageal reflux disease without esophagitis: K21.9

## 2021-01-11 HISTORY — DX: Anxiety disorder, unspecified: F41.9

## 2021-01-11 LAB — BASIC METABOLIC PANEL
Anion gap: 11 (ref 5–15)
BUN: 17 mg/dL (ref 6–20)
CO2: 27 mmol/L (ref 22–32)
Calcium: 8.6 mg/dL — ABNORMAL LOW (ref 8.9–10.3)
Chloride: 101 mmol/L (ref 98–111)
Creatinine, Ser: 0.95 mg/dL (ref 0.61–1.24)
GFR, Estimated: >60 mL/min (ref >60)
Glucose, Bld: 81 mg/dL (ref 70–99)
Potassium: 3.7 mmol/L (ref 3.5–5.1)
Sodium: 139 mmol/L (ref 135–145)

## 2021-01-14 ENCOUNTER — Ambulatory Visit (HOSPITAL_COMMUNITY): Payer: Medicaid Other | Admitting: Anesthesiology

## 2021-01-14 ENCOUNTER — Other Ambulatory Visit: Payer: Self-pay

## 2021-01-14 ENCOUNTER — Encounter (HOSPITAL_COMMUNITY): Payer: Self-pay

## 2021-01-14 ENCOUNTER — Ambulatory Visit (HOSPITAL_COMMUNITY)
Admission: RE | Admit: 2021-01-14 | Discharge: 2021-01-14 | Disposition: A | Discharge status: Home / Self Care | Payer: Medicaid Other | Attending: Internal Medicine | Admitting: Internal Medicine

## 2021-01-14 ENCOUNTER — Encounter (HOSPITAL_COMMUNITY)
Admission: RE | Disposition: A | Discharge status: Home / Self Care | Payer: Self-pay | Source: Home / Self Care | Attending: Internal Medicine

## 2021-01-14 DIAGNOSIS — K2970 Gastritis, unspecified, without bleeding: Secondary | ICD-10-CM | POA: Diagnosis not present

## 2021-01-14 DIAGNOSIS — I10 Essential (primary) hypertension: Secondary | ICD-10-CM | POA: Insufficient documentation

## 2021-01-14 DIAGNOSIS — Z8673 Personal history of transient ischemic attack (TIA), and cerebral infarction without residual deficits: Secondary | ICD-10-CM | POA: Diagnosis not present

## 2021-01-14 DIAGNOSIS — F419 Anxiety disorder, unspecified: Secondary | ICD-10-CM | POA: Diagnosis not present

## 2021-01-14 DIAGNOSIS — K319 Disease of stomach and duodenum, unspecified: Secondary | ICD-10-CM | POA: Insufficient documentation

## 2021-01-14 DIAGNOSIS — F17210 Nicotine dependence, cigarettes, uncomplicated: Secondary | ICD-10-CM | POA: Insufficient documentation

## 2021-01-14 DIAGNOSIS — R12 Heartburn: Secondary | ICD-10-CM | POA: Diagnosis present

## 2021-01-14 DIAGNOSIS — K2980 Duodenitis without bleeding: Secondary | ICD-10-CM

## 2021-01-14 DIAGNOSIS — R1013 Epigastric pain: Secondary | ICD-10-CM | POA: Diagnosis present

## 2021-01-14 DIAGNOSIS — K2100 Gastro-esophageal reflux disease with esophagitis, without bleeding: Secondary | ICD-10-CM | POA: Insufficient documentation

## 2021-01-14 HISTORY — PX: ESOPHAGOGASTRODUODENOSCOPY (EGD) WITH PROPOFOL: SHX5813

## 2021-01-14 HISTORY — PX: BIOPSY: SHX5522

## 2021-01-14 SURGERY — ESOPHAGOGASTRODUODENOSCOPY (EGD) WITH PROPOFOL
Anesthesia: General

## 2021-01-14 MED ORDER — MIDAZOLAM HCL 2 MG/2ML IJ SOLN: 1.0000 mg | Freq: Once | INTRAMUSCULAR | Status: AC

## 2021-01-14 MED ORDER — LIDOCAINE HCL (CARDIAC) PF 50 MG/5ML IV SOSY
PREFILLED_SYRINGE | INTRAVENOUS | Status: DC | PRN
  Administered 2021-01-14: 50 mg via INTRAVENOUS

## 2021-01-14 MED ORDER — LACTATED RINGERS IV SOLN: INTRAVENOUS | Status: DC

## 2021-01-14 MED ORDER — MIDAZOLAM HCL 2 MG/2ML IJ SOLN
INTRAMUSCULAR | Status: AC
  Administered 2021-01-14: 1 mg via INTRAVENOUS
  Filled 2021-01-14: qty 2

## 2021-01-14 MED ORDER — PROPOFOL 10 MG/ML IV BOLUS
INTRAVENOUS | Status: DC | PRN
  Administered 2021-01-14: 150 mg via INTRAVENOUS
  Administered 2021-01-14: 100 mg via INTRAVENOUS

## 2021-01-14 MED ORDER — PANTOPRAZOLE SODIUM 40 MG PO TBEC: 40.0000 mg | DELAYED_RELEASE_TABLET | Freq: Two times a day (BID) | ORAL | 5 refills | Status: DC

## 2021-01-14 NOTE — Anesthesia Preprocedure Evaluation (Signed)
Anesthesia Evaluation  Patient identified by MRN, date of birth, ID band Patient awake    Reviewed: Allergy & Precautions, H&P , NPO status , Patient's Chart, lab work & pertinent test results, reviewed documented beta blocker date and time   Airway Mallampati: II  TM Distance: >3 FB Neck ROM: full    Dental no notable dental hx.    Pulmonary shortness of breath, Current Smoker,    Pulmonary exam normal breath sounds clear to auscultation       Cardiovascular Exercise Tolerance: Good hypertension, negative cardio ROS   Rhythm:regular Rate:Normal     Neuro/Psych PSYCHIATRIC DISORDERS Anxiety TIA   GI/Hepatic Neg liver ROS, GERD  Medicated,  Endo/Other  negative endocrine ROS  Renal/GU negative Renal ROS  negative genitourinary   Musculoskeletal   Abdominal   Peds  Hematology negative hematology ROS (+)   Anesthesia Other Findings   Reproductive/Obstetrics negative OB ROS                             Anesthesia Physical Anesthesia Plan  ASA: 2  Anesthesia Plan: General   Post-op Pain Management:    Induction:   PONV Risk Score and Plan: Propofol infusion  Airway Management Planned:   Additional Equipment:   Intra-op Plan:   Post-operative Plan:   Informed Consent: I have reviewed the patients History and Physical, chart, labs and discussed the procedure including the risks, benefits and alternatives for the proposed anesthesia with the patient or authorized representative who has indicated his/her understanding and acceptance.     Dental Advisory Given  Plan Discussed with: CRNA  Anesthesia Plan Comments:         Anesthesia Quick Evaluation

## 2021-01-14 NOTE — H&P (Signed)
Primary Care Physician:  Leslie Andrea, MD Primary Gastroenterologist:  Dr. Abbey Chatters  Pre-Procedure History & Physical: HPI:  Caleb Powell is a 51 y.o. male is here  here for an EGD for chronic throat burning, GERD, epigastric pain.   Past Medical History:  Diagnosis Date   Anxiety    Dyspnea    GERD (gastroesophageal reflux disease)    Hypertension     Past Surgical History:  Procedure Laterality Date   IR ANGIO INTRA EXTRACRAN SEL INTERNAL CAROTID BILAT MOD SED  08/30/2020   IR ANGIO VERTEBRAL SEL VERTEBRAL BILAT MOD SED  08/30/2020   IR INTRAVSC STENT CERV CAROTID W/EMB-PROT MOD SED INCL ANGIO  08/30/2020   IR US GUIDE VASC ACCESS RIGHT  08/30/2020   RADIOLOGY WITH ANESTHESIA N/A 08/30/2020   Procedure: IR WITH ANESTHESIA;  Surgeon: Pedro Earls, MD;  Location: [facility];  Service: Radiology;  Laterality: N/A;    Prior to Admission medications   Medication Sig Start Date End Date Taking? Authorizing Provider  aspirin 81 MG chewable tablet Chew 1 tablet (81 mg total) by mouth daily. 09/01/20  Yes Audria Nine, DO  hydrochlorothiazide (MICROZIDE) 12.5 MG capsule Take 12.5 mg by mouth daily. 08/13/20  Yes [provider]  LORazepam (ATIVAN) 0.5 MG tablet Take 0.5 mg by mouth daily. 08/13/20  Yes [provider]  lovastatin (MEVACOR) 10 MG tablet Take 10 mg by mouth daily. 08/28/20  Yes [provider]  ramipril (ALTACE) 5 MG capsule Take 5 mg by mouth daily. 08/28/20  Yes [provider]  amLODipine (NORVASC) 5 MG tablet Take 1 tablet (5 mg total) by mouth daily. Patient not taking: Reported on 08/28/2020 04/17/20   Soyla Dryer, PA-C  fluvastatin (LESCOL) 20 MG capsule Take 1 capsule (20 mg total) by mouth at bedtime. Patient not taking: Reported on 08/28/2020 06/01/19   Soyla Dryer, PA-C  omeprazole (PRILOSEC) 40 MG capsule Take 1 capsule (40 mg total) by mouth 2 (two) times daily. Patient not taking: Reported on 01/02/2021  04/17/20   Soyla Dryer, PA-C  ticagrelor (BRILINTA) 90 MG TABS tablet Take 1 tablet (90 mg total) by mouth 2 (two) times daily. Patient not taking: Reported on 01/02/2021 11/27/20   Ascencion Dike, PA-C    Allergies as of 12/24/2020 - Review Complete 12/05/2020  Allergen Reaction Noted   Penicillins  03/16/2014    Family History  Problem Relation Age of Onset   Diabetes Mother    Heart disease Mother    Stroke Mother    Hypertension Maternal Grandmother    Hypertension Maternal Grandfather    Heart disease Father     Social History   Socioeconomic History   Marital status: Legally Separated    Spouse name: Not on file   Number of children: Not on file   Years of education: Not on file   Highest education level: Not on file  Occupational History   Not on file  Tobacco Use   Smoking status: Every Day    Packs/day: 2.50    Years: 36.00    Pack years: 90.00    Types: Cigarettes   Smokeless tobacco: Former  Scientific laboratory technician Use: Former   Devices: used for about one month  Substance and Sexual Activity   Alcohol use: Yes    Comment: rare   Drug use: No   Sexual activity: Yes    Birth control/protection: None  Other Topics Concern   Not on file  Social History  Narrative   Not on file   Social Determinants of Health   Financial Resource Strain: Not on file  Food Insecurity: Not on file  Transportation Needs: Not on file  Physical Activity: Not on file  Stress: Not on file  Social Connections: Not on file  Intimate Partner Violence: Not on file    Review of Systems: See HPI, otherwise negative ROS  Physical Exam: Vital signs in last 24 hours: Temp:  [98 F (36.7 C)] 98 F (36.7 C) (01/09 1256) Pulse Rate:  [76] 76 (01/09 1256) Resp:  [18] 18 (01/09 1256) BP: (173)/(70) 173/70 (01/09 1256) SpO2:  [100 %] 100 % (01/09 1256)   General:   Alert,  Well-developed, well-nourished, pleasant and cooperative in NAD Head:  Normocephalic and  atraumatic. Eyes:  Sclera clear, no icterus.   Conjunctiva pink. Ears:  Normal auditory acuity. Nose:  No deformity, discharge,  or lesions. Mouth:  No deformity or lesions, dentition normal. Neck:  Supple; no masses or thyromegaly. Lungs:  Clear throughout to auscultation.   No wheezes, crackles, or rhonchi. No acute distress. Heart:  Regular rate and rhythm; no murmurs, clicks, rubs,  or gallops. Abdomen:  Soft, nontender and nondistended. No masses, hepatosplenomegaly or hernias noted. Normal bowel sounds, without guarding, and without rebound.   Msk:  Symmetrical without gross deformities. Normal posture. Extremities:  Without clubbing or edema. Neurologic:  Alert and  oriented x4;  grossly normal neurologically. Skin:  Intact without significant lesions or rashes. Cervical Nodes:  No significant cervical adenopathy. Psych:  Alert and cooperative. Normal mood and affect.  Impression/Plan: Caleb Powell is here for an EGD for chronic throat burning, GERD, epigastric pain.   The risks of the procedure including infection, bleed, or perforation as well as benefits, limitations, alternatives and imponderables have been reviewed with the patient. Questions have been answered. All parties agreeable.

## 2021-01-14 NOTE — Transfer of Care (Signed)
Immediate Anesthesia Transfer of Care Note  Patient: Caleb Powell  Procedure(s) Performed: ESOPHAGOGASTRODUODENOSCOPY (EGD) WITH PROPOFOL BIOPSY  Patient Location: Short Stay  Anesthesia Type:General  Level of Consciousness: awake  Airway & Oxygen Therapy: Patient Spontanous Breathing  Post-op Assessment: Report given to RN and Post -op Vital signs reviewed and stable  Post vital signs: Reviewed and stable  Last Vitals:  Vitals Value Taken Time  BP    Temp    Pulse    Resp    SpO2      Last Pain:  Vitals:   01/14/21 1357  TempSrc:   PainSc: 0-No pain         Complications: No notable events documented.

## 2021-01-14 NOTE — Discharge Instructions (Addendum)
EGD Discharge instructions Please read the instructions outlined below and refer to this sheet in the next few weeks. These discharge instructions provide you with general information on caring for yourself after you leave the hospital. Your doctor may also give you specific instructions. While your treatment has been planned according to the most current medical practices available, unavoidable complications occasionally occur. If you have any problems or questions after discharge, please call your doctor. ACTIVITY You may resume your regular activity but move at a slower pace for the next 24 hours.  Take frequent rest periods for the next 24 hours.  Walking will help expel (get rid of) the air and reduce the bloated feeling in your abdomen.  No driving for 24 hours (because of the anesthesia (medicine) used during the test).  You may shower.  Do not sign any important legal documents or operate any machinery for 24 hours (because of the anesthesia used during the test).  NUTRITION Drink plenty of fluids.  You may resume your normal diet.  Begin with a light meal and progress to your normal diet.  Avoid alcoholic beverages for 24 hours or as instructed by your caregiver.  MEDICATIONS You may resume your normal medications unless your caregiver tells you otherwise.  WHAT YOU CAN EXPECT TODAY You may experience abdominal discomfort such as a feeling of fullness or gas pains.  FOLLOW-UP Your doctor will discuss the results of your test with you.  SEEK IMMEDIATE MEDICAL ATTENTION IF ANY OF THE FOLLOWING OCCUR: Excessive nausea (feeling sick to your stomach) and/or vomiting.  Severe abdominal pain and distention (swelling).  Trouble swallowing.  Temperature over 101 F (37.8 C).  Rectal bleeding or vomiting of blood.   Your EGD revealed mild amount inflammation in your stomach and small bowel.  I took biopsies of this to rule out infection with a bacteria called H. pylori.  Await pathology  results, my office will contact you. Avoid NSAIDS. Start Pantoprazole 40 mg twice daily, stop Omeprazole. We will reschedule you for colonoscopy    I hope you have a great rest of your week!  Elon Alas. Abbey Chatters, D.O. Gastroenterology and Hepatology [facility]

## 2021-01-14 NOTE — Op Note (Signed)
[facility] Patient Name: Caleb Powell Procedure Date: 01/14/2021 1:45 PM MRN: [id number] Date of Birth: 1970/03/08 Attending MD: Elon Alas. Abbey Chatters DO CSN: [id number] Age: 51 Admit Type: Outpatient Procedure:                Upper GI endoscopy Indications:              Epigastric abdominal pain, Heartburn Providers:                Elon Alas. Abbey Chatters, DO, Hughie Closs, RN, Caprice Kluver, Nelma Rothman, Technician Referring MD:              Medicines:                See the Anesthesia note for documentation of the                            administered medications Complications:            No immediate complications. Estimated Blood Loss:     Estimated blood loss was minimal. Procedure:                Pre-Anesthesia Assessment:                           - The anesthesia plan was to use monitored                            anesthesia care (MAC).                           After obtaining informed consent, the endoscope was                            passed under direct vision. Throughout the                            procedure, the patient's blood pressure, pulse, and                            oxygen saturations were monitored continuously. The                            [id number]-H190 ([id number]) scope was introduced through the                            mouth, and advanced to the second part of duodenum.                            The upper GI endoscopy was accomplished without                            difficulty. The patient tolerated the procedure                            well. Scope  In: Scope Out: 2:05:28 PM Findings:      LA Grade A (one or more mucosal breaks less than 5 mm, not extending       between tops of 2 mucosal folds) esophagitis with no bleeding was found       at the gastroesophageal junction.      Localized moderate inflammation characterized by erosions and erythema       was found in the gastric antrum. Biopsies were taken with a cold forceps        for Helicobacter pylori testing.      Scattered mild inflammation characterized by erythema was found in the       duodenal bulb, in the first portion of the duodenum and in the second       portion of the duodenum. Biopsies were taken with a cold forceps for       histology. Impression:               - LA Grade A reflux esophagitis with no bleeding.                           - Gastritis. Biopsied.                           - Duodenitis. Biopsied. Moderate Sedation:      Per Anesthesia Care Recommendation:           - Patient has a contact number available for                            emergencies. The signs and symptoms of potential                            delayed complications were discussed with the                            patient. Return to normal activities tomorrow.                            Written discharge instructions were provided to the                            patient.                           - Resume previous diet.                           - Continue present medications.                           - Await pathology results.                           - Use a proton pump inhibitor PO BID.                           - No ibuprofen, naproxen, or other non-steroidal  anti-inflammatory drugs.                           - Needs colonoscopy for colon cancer screening                            purposes. Patient initially cancelled but would                            like to reschedule today. Procedure Code(s):        --- Professional ---                           [phone], Esophagogastroduodenoscopy, flexible,                            transoral; with biopsy, single or multiple Diagnosis Code(s):        --- Professional ---                           K21.00, Gastro-esophageal reflux disease with                            esophagitis, without bleeding                           K29.70, Gastritis, unspecified, without bleeding                            K29.80, Duodenitis without bleeding                           R10.13, Epigastric pain                           R12, Heartburn CPT copyright 2019 American Medical Association. All rights reserved. The codes documented in this report are preliminary and upon coder review may  be revised to meet current compliance requirements. Elon Alas. Abbey Chatters, DO [place] Abbey Chatters, DO 01/14/2021 2:10:08 PM This report has been signed electronically. Number of Addenda: 0

## 2021-01-14 NOTE — Anesthesia Procedure Notes (Signed)
Date/Time: 01/14/2021 1:56 PM Performed by: Vista Deck, CRNA Pre-anesthesia Checklist: Patient identified, Emergency Drugs available, Suction available, Timeout performed and Patient being monitored Patient Re-evaluated:Patient Re-evaluated prior to induction Oxygen Delivery Method: Nasal Cannula

## 2021-01-14 NOTE — Anesthesia Postprocedure Evaluation (Signed)
Anesthesia Post Note  Patient: Caleb Powell  Procedure(s) Performed: ESOPHAGOGASTRODUODENOSCOPY (EGD) WITH PROPOFOL BIOPSY  Patient location during evaluation: Phase II Anesthesia Type: General Level of consciousness: awake Pain management: pain level controlled Vital Signs Assessment: post-procedure vital signs reviewed and stable Respiratory status: spontaneous breathing and respiratory function stable Cardiovascular status: blood pressure returned to baseline and stable Postop Assessment: no headache and no apparent nausea or vomiting Anesthetic complications: no Comments: Late entry   No notable events documented.   Last Vitals:  Vitals:   01/14/21 1256  BP: (!) 173/70  Pulse: 76  Resp: 18  Temp: 36.7 C  SpO2: 100%    Last Pain:  Vitals:   01/14/21 1357  TempSrc:   PainSc: 0-No pain                 Louann Sjogren

## 2021-01-16 LAB — SURGICAL PATHOLOGY

## 2021-01-17 ENCOUNTER — Encounter (HOSPITAL_COMMUNITY): Payer: Self-pay | Admitting: Internal Medicine

## 2021-01-22 ENCOUNTER — Telehealth: Payer: Self-pay | Admitting: Internal Medicine

## 2021-01-22 NOTE — Telephone Encounter (Signed)
Patient called asking when his tcs may be scheduled.

## 2021-01-22 NOTE — Telephone Encounter (Signed)
Spoke with pt. He is ready to schedule screening TCS. Aware will call to get him scheduled once we have Dr. Ave Filter feb schedule

## 2021-01-23 ENCOUNTER — Encounter: Payer: Self-pay | Admitting: *Deleted

## 2021-01-23 ENCOUNTER — Telehealth: Payer: Self-pay | Admitting: Internal Medicine

## 2021-01-23 MED ORDER — CLENPIQ 10-3.5-12 MG-GM -GM/160ML PO SOLN: 1.0000 | Freq: Once | ORAL | 0 refills | Status: AC

## 2021-01-23 NOTE — Telephone Encounter (Signed)
Called pt, received message not available currently, try call again later

## 2021-01-23 NOTE — Telephone Encounter (Signed)
Patient returned call. He has been scheduled for 2/6 at 11:30am. Aware will send prep instructions, RX to pharmacy and will call with pre-op appt.

## 2021-01-23 NOTE — Addendum Note (Signed)
Addended by: Cheron Every on: 01/23/2021 01:40 PM   Modules accepted: Orders

## 2021-01-23 NOTE — Telephone Encounter (Signed)
See other phone note

## 2021-01-23 NOTE — Telephone Encounter (Signed)
Pt returning call. [phone]

## 2021-02-04 NOTE — Patient Instructions (Signed)
[place]  02/04/2021     @PREFPERIOPPHARMACY @   Your procedure is scheduled on  02/11/2021.   Report to [facility] at  1000 A.M.   Call this number if you have problems the morning of surgery:  [phone]   Remember:  Follow the diet and prep instructions given to you by the office.    Take these medicines the morning of surgery with A SIP OF WATER                                ativan, protonix.     Do not wear jewelry, make-up or nail polish.  Do not wear lotions, powders, or perfumes, or deodorant.  Do not shave 48 hours prior to surgery.  Men may shave face and neck.  Do not bring valuables to the hospital.  [facility] is not responsible for any belongings or valuables.  Contacts, dentures or bridgework may not be worn into surgery.  Leave your suitcase in the car.  After surgery it may be brought to your room.  For patients admitted to the hospital, discharge time will be determined by your treatment team.  Patients discharged the day of surgery will not be allowed to drive home and must have someone with them for 24 hours.    Special instructions:   DO NOT smoke tobacco or vape for 24 hours before your procedure.  Please read over the following fact sheets that you were given. Anesthesia Post-op Instructions and Care and Recovery After Surgery      Colonoscopy, Adult, Care After This sheet gives you information about how to care for yourself after your procedure. Your health care provider may also give you more specific instructions. If you have problems or questions, contact your health care provider. What can I expect after the procedure? After the procedure, it is common to have: A small amount of blood in your stool for 24 hours after the procedure. Some gas. Mild cramping or bloating of your abdomen. Follow these instructions at home: Eating and drinking  Drink enough fluid to keep your urine pale yellow. Follow instructions from your  health care provider about eating or drinking restrictions. Resume your normal diet as instructed by your health care provider. Avoid heavy or fried foods that are hard to digest. Activity Rest as told by your health care provider. Avoid sitting for a long time without moving. Get up to take short walks every 1-2 hours. This is important to improve blood flow and breathing. Ask for help if you feel weak or unsteady. Return to your normal activities as told by your health care provider. Ask your health care provider what activities are safe for you. Managing cramping and bloating  Try walking around when you have cramps or feel bloated. Apply heat to your abdomen as told by your health care provider. Use the heat source that your health care provider recommends, such as a moist heat pack or a heating pad. Place a towel between your skin and the heat source. Leave the heat on for 20-30 minutes. Remove the heat if your skin turns bright red. This is especially important if you are unable to feel pain, heat, or cold. You may have a greater risk of getting burned. General instructions If you were given a sedative during the procedure, it can affect you for several hours. Do not drive or operate machinery  until your health care provider says that it is safe. For the first 24 hours after the procedure: Do not sign important documents. Do not drink alcohol. Do your regular daily activities at a slower pace than normal. Eat soft foods that are easy to digest. Take over-the-counter and prescription medicines only as told by your health care provider. Keep all follow-up visits as told by your health care provider. This is important. Contact a health care provider if: You have blood in your stool 2-3 days after the procedure. Get help right away if you have: More than a small spotting of blood in your stool. Large blood clots in your stool. Swelling of your abdomen. Nausea or vomiting. A  fever. Increasing pain in your abdomen that is not relieved with medicine. Summary After the procedure, it is common to have a small amount of blood in your stool. You may also have mild cramping and bloating of your abdomen. If you were given a sedative during the procedure, it can affect you for several hours. Do not drive or operate machinery until your health care provider says that it is safe. Get help right away if you have a lot of blood in your stool, nausea or vomiting, a fever, or increased pain in your abdomen. This information is not intended to replace advice given to you by your health care provider. Make sure you discuss any questions you have with your health care provider. Document Revised: 10/29/2018 Document Reviewed: 07/19/2018 Elsevier Patient Education  [place] After This sheet gives you information about how to care for yourself after your procedure. Your health care provider may also give you more specific instructions. If you have problems or questions, contact your health care provider. What can I expect after the procedure? After the procedure, it is common to have: Tiredness. Forgetfulness about what happened after the procedure. Impaired judgment for important decisions. Nausea or vomiting. Some difficulty with balance. Follow these instructions at home: For the time period you were told by your health care provider:   Rest as needed. Do not participate in activities where you could fall or become injured. Do not drive or use machinery. Do not drink alcohol. Do not take sleeping pills or medicines that cause drowsiness. Do not make important decisions or sign legal documents. Do not take care of children on your own. Eating and drinking Follow the diet that is recommended by your health care provider. Drink enough fluid to keep your urine pale yellow. If you vomit: Drink water, juice, or soup when you can drink  without vomiting. Make sure you have little or no nausea before eating solid foods. General instructions Have a responsible adult stay with you for the time you are told. It is important to have someone help care for you until you are awake and alert. Take over-the-counter and prescription medicines only as told by your health care provider. If you have sleep apnea, surgery and certain medicines can increase your risk for breathing problems. Follow instructions from your health care provider about wearing your sleep device: Anytime you are sleeping, including during daytime naps. While taking prescription pain medicines, sleeping medicines, or medicines that make you drowsy. Avoid smoking. Keep all follow-up visits as told by your health care provider. This is important. Contact a health care provider if: You keep feeling nauseous or you keep vomiting. You feel light-headed. You are still sleepy or having trouble with balance after 24 hours. You develop a  rash. You have a fever. You have redness or swelling around the IV site. Get help right away if: You have trouble breathing. You have new-onset confusion at home. Summary For several hours after your procedure, you may feel tired. You may also be forgetful and have poor judgment. Have a responsible adult stay with you for the time you are told. It is important to have someone help care for you until you are awake and alert. Rest as told. Do not drive or operate machinery. Do not drink alcohol or take sleeping pills. Get help right away if you have trouble breathing, or if you suddenly become confused. This information is not intended to replace advice given to you by your health care provider. Make sure you discuss any questions you have with your health care provider. Document Revised: 09/08/2019 Document Reviewed: 11/25/2018 Elsevier Patient Education  2022 [employer].

## 2021-02-07 ENCOUNTER — Encounter (HOSPITAL_COMMUNITY): Payer: Self-pay | Admitting: Certified Registered"

## 2021-02-07 ENCOUNTER — Encounter (HOSPITAL_COMMUNITY): Payer: Self-pay

## 2021-02-07 ENCOUNTER — Encounter (HOSPITAL_COMMUNITY)
Admission: RE | Admit: 2021-02-07 | Discharge: 2021-02-07 | Disposition: A | Discharge status: Home / Self Care | Payer: Medicaid Other | Source: Ambulatory Visit | Attending: Internal Medicine | Admitting: Internal Medicine

## 2021-02-07 ENCOUNTER — Telehealth: Payer: Self-pay

## 2021-02-07 HISTORY — DX: Cerebral infarction, unspecified: I63.9

## 2021-02-07 NOTE — Telephone Encounter (Signed)
Call fro [facility] short stay pt was there for pre-op for colonoscopy wanted to eat pudding to take his medication the morning of the procedure. Phoned and asked Dr Abbey Chatters regarding this. Pt is to not take med that morning of the procedure that he could wait, called back to [facility] advised the nurse of what Dr Abbey Chatters said. The nurse expressed understanding.

## 2021-02-11 ENCOUNTER — Encounter (HOSPITAL_COMMUNITY): Admission: RE | Payer: Self-pay | Source: Home / Self Care

## 2021-02-11 ENCOUNTER — Ambulatory Visit (HOSPITAL_COMMUNITY): Admission: RE | Admit: 2021-02-11 | Payer: Medicaid Other | Source: Home / Self Care

## 2021-02-11 SURGERY — COLONOSCOPY WITH PROPOFOL
Anesthesia: Monitor Anesthesia Care

## 2021-06-19 ENCOUNTER — Encounter: Payer: Self-pay | Admitting: Internal Medicine

## 2021-06-19 ENCOUNTER — Ambulatory Visit (INDEPENDENT_AMBULATORY_CARE_PROVIDER_SITE_OTHER): Payer: Medicaid Other | Admitting: Internal Medicine

## 2021-06-19 VITALS — BP 158/73 | HR 79 | Temp 97.8°F | Ht 68.0 in | Wt 171.9 lb

## 2021-06-19 DIAGNOSIS — K2100 Gastro-esophageal reflux disease with esophagitis, without bleeding: Secondary | ICD-10-CM | POA: Diagnosis not present

## 2021-06-19 DIAGNOSIS — J312 Chronic pharyngitis: Secondary | ICD-10-CM

## 2021-06-19 MED ORDER — ESOMEPRAZOLE MAGNESIUM 40 MG PO CPDR: 40.0000 mg | DELAYED_RELEASE_CAPSULE | Freq: Two times a day (BID) | ORAL | 5 refills | Status: DC

## 2021-06-19 NOTE — Progress Notes (Signed)
Referring Provider: Medicine, [place]* Primary Care Physician:  Medicine, [facility]:  Dr. Abbey Chatters  Chief Complaint  Patient presents with   Gastroesophageal Reflux    Patient here today with complaints of Pantoprazole 40 mg causing his bp to increase. He states he can not take famotidine, as it causing anxiety issues.     HPI:   Caleb Powell is a 51 y.o. male who presents to clinic today for follow-up visit.  History of chronic sore throat and GERD.  Underwent EGD 01/14/2021 which showed LA grade a esophagitis, gastritis, duodenitis.  Biopsies negative for H. pylori.  Was placed on pantoprazole 40 mg twice daily.  States his symptoms actually improved though he recently stopped this medication 3 to 4 weeks ago as it was causing issues with his blood pressure.  Has tried lansoprazole and omeprazole in the past which did not help.  Was evaluated by ENT with normal flexible laryngoscopy.  Subsequently underwent CT neck with contrast which showed mild asymmetry of the vocal cords otherwise unremarkable.  Also had his thyroid evaluated which was unremarkable.  No dysphagia or odynophagia.  No chronic NSAID use.  Needs colonoscopy for colon cancer screening purposes.  We will schedule but he canceled this as there is an issue with him taking his blood pressure medications.  He states he cannot take any medications without applesauce.    Past Medical History:  Diagnosis Date   Anxiety    Dyspnea    GERD (gastroesophageal reflux disease)    Hypertension    Stroke [facility])    eye stroke    Past Surgical History:  Procedure Laterality Date   BIOPSY  01/14/2021   Procedure: BIOPSY;  Surgeon: Eloise Harman, DO;  Location: [facility];  Service: Endoscopy;;   ESOPHAGOGASTRODUODENOSCOPY (EGD) WITH PROPOFOL N/A 01/14/2021   Procedure: ESOPHAGOGASTRODUODENOSCOPY (EGD) WITH PROPOFOL;  Surgeon: Eloise Harman, DO;  Location: [facility];  Service:  Endoscopy;  Laterality: N/A;  2:30pm   IR ANGIO INTRA EXTRACRAN SEL INTERNAL CAROTID BILAT MOD SED  08/30/2020   IR ANGIO VERTEBRAL SEL VERTEBRAL BILAT MOD SED  08/30/2020   IR INTRAVSC STENT CERV CAROTID W/EMB-PROT MOD SED INCL ANGIO  08/30/2020   IR US GUIDE VASC ACCESS RIGHT  08/30/2020   RADIOLOGY WITH ANESTHESIA N/A 08/30/2020   Procedure: IR WITH ANESTHESIA;  Surgeon: Pedro Earls, MD;  Location: [place];  Service: Radiology;  Laterality: N/A;    Current Outpatient Medications  Medication Sig Dispense Refill   aspirin 81 MG chewable tablet Chew 1 tablet (81 mg total) by mouth daily. 30 tablet 0   esomeprazole (NEXIUM) 40 MG capsule Take 1 capsule (40 mg total) by mouth 2 (two) times daily before a meal. 60 capsule 5   LORazepam (ATIVAN) 0.5 MG tablet Take 0.25 mg by mouth in the morning.     olmesartan (BENICAR) 20 MG tablet Take 20 mg by mouth daily.     OVER THE COUNTER MEDICATION Vitamin D3 with K2 once per day.     OVER THE COUNTER MEDICATION Cinnamon daily.     OVER THE COUNTER MEDICATION HCTZ 10 mg daily.     rosuvastatin (CRESTOR) 10 MG tablet Take 10 mg by mouth every evening.     vitamin B-12 (CYANOCOBALAMIN) 500 MCG tablet Take 500 mcg by mouth daily.     amLODipine (NORVASC) 5 MG tablet Take 1 tablet (5 mg total) by mouth daily. (Patient not  taking: Reported on 06/19/2021) 30 tablet 3   No current facility-administered medications for this visit.    Allergies as of 06/19/2021 - Review Complete 06/19/2021  Allergen Reaction Noted   Penicillins Other (See Comments) 03/16/2014    Family History  Problem Relation Age of Onset   Diabetes Mother    Heart disease Mother    Stroke Mother    Hypertension Maternal Grandmother    Hypertension Maternal Grandfather    Heart disease Father     Social History   Socioeconomic History   Marital status: Legally Separated    Spouse name: Not on file   Number of children: Not on file   Years of education: Not on  file   Highest education level: Not on file  Occupational History   Not on file  Tobacco Use   Smoking status: Every Day    Packs/day: 2.50    Years: 36.00    Total pack years: 90.00    Types: Cigarettes   Smokeless tobacco: Former  Scientific laboratory technician Use: Former   Devices: used for about one month  Substance and Sexual Activity   Alcohol use: Yes    Comment: rare   Drug use: No   Sexual activity: Yes    Birth control/protection: None  Other Topics Concern   Not on file  Social History Narrative   Not on file   Social Determinants of Health   Financial Resource Strain: Not on file  Food Insecurity: Not on file  Transportation Needs: Not on file  Physical Activity: Not on file  Stress: Not on file  Social Connections: Not on file    Subjective: Review of Systems  Constitutional:  Negative for chills and fever.  HENT:  Positive for sore throat. Negative for congestion and hearing loss.   Eyes:  Negative for blurred vision and double vision.  Respiratory:  Negative for cough and shortness of breath.   Cardiovascular:  Negative for chest pain and palpitations.  Gastrointestinal:  Positive for heartburn. Negative for abdominal pain, blood in stool, constipation, diarrhea, melena and vomiting.  Genitourinary:  Negative for dysuria and urgency.  Musculoskeletal:  Negative for joint pain and myalgias.  Skin:  Negative for itching and rash.  Neurological:  Negative for dizziness and headaches.  Psychiatric/Behavioral:  Negative for depression. The patient is not nervous/anxious.      Objective: BP (!) 158/73 (BP Location: Left Arm, Patient Position: Sitting, Cuff Size: Large)   Pulse 79   Temp 97.8 F (36.6 C) (Oral)   Ht '5\' 8"'$  (1.727 m)   Wt 171 lb 14.4 oz (78 kg)   BMI 26.14 kg/m  Physical Exam Constitutional:      Appearance: Normal appearance.  HENT:     Head: Normocephalic and atraumatic.  Eyes:     Extraocular Movements: Extraocular movements intact.      Conjunctiva/sclera: Conjunctivae normal.  Cardiovascular:     Rate and Rhythm: Normal rate and regular rhythm.  Pulmonary:     Effort: Pulmonary effort is normal.     Breath sounds: Normal breath sounds.  Abdominal:     General: Bowel sounds are normal.     Palpations: Abdomen is soft.  Musculoskeletal:        General: Normal range of motion.     Cervical back: Normal range of motion and neck supple.  Skin:    General: Skin is warm.  Neurological:     General: No focal deficit present.  Mental Status: He is alert and oriented to person, place, and time.  Psychiatric:        Mood and Affect: Mood normal.        Behavior: Behavior normal.      Assessment: *GERD with esophagitis *Chronic sore throat *Colon cancer screening  Plan: Patient states his upper GI symptoms improved drastically on pantoprazole twice daily though he is stopped this medication as it was causing his blood pressure to worsen.  Previously trialed and failed omeprazole and lansoprazole with no improvement in symptoms.  I will change him to Esomeprazole twice daily today.  Prescription sent to pharmacy.  Patient is interested in undergoing colonoscopy.  Previously canceled as he was told he cannot take his medications with applesauce morning of procedure.  Discussed case with anesthesia.  As long as patient takes his medications at least 6 hours before his procedure then we can proceed.  Patient is agreeable.  He would like to have his legs further evaluated from a vascular standpoint first.  He will call office to schedule his screening colonoscopy in the future.  ASA 3.  Otherwise follow-up in 6 months.  06/19/2021 5:02 PM   Disclaimer: This note was dictated with voice recognition software. Similar sounding words can inadvertently be transcribed and may not be corrected upon review.

## 2021-06-19 NOTE — Patient Instructions (Signed)
I will change your Pantoprazole to Esomeprazole 40 mg twice daily. And have sent this to your pharmacy.   Let us know when you want to reschedule your colonoscopy. Okay to take your BP meds prior but needs to be at least 6 hours before procedure.   Otherwise follow up in 6 months.   It was nice seeing both of you today.  Dr. Abbey Chatters

## 2021-07-06 ENCOUNTER — Other Ambulatory Visit: Payer: Self-pay

## 2021-07-06 ENCOUNTER — Emergency Department (HOSPITAL_COMMUNITY)
Admission: EM | Admit: 2021-07-06 | Discharge: 2021-07-06 | Disposition: A | Discharge status: Home / Self Care | Payer: Medicaid Other | Attending: Emergency Medicine | Admitting: Emergency Medicine

## 2021-07-06 ENCOUNTER — Encounter (HOSPITAL_COMMUNITY): Payer: Self-pay | Admitting: Emergency Medicine

## 2021-07-06 ENCOUNTER — Emergency Department (HOSPITAL_COMMUNITY): Payer: Medicaid Other

## 2021-07-06 DIAGNOSIS — R200 Anesthesia of skin: Secondary | ICD-10-CM

## 2021-07-06 DIAGNOSIS — Z7982 Long term (current) use of aspirin: Secondary | ICD-10-CM | POA: Diagnosis not present

## 2021-07-06 DIAGNOSIS — R079 Chest pain, unspecified: Secondary | ICD-10-CM | POA: Insufficient documentation

## 2021-07-06 DIAGNOSIS — Z79899 Other long term (current) drug therapy: Secondary | ICD-10-CM | POA: Diagnosis not present

## 2021-07-06 DIAGNOSIS — F17210 Nicotine dependence, cigarettes, uncomplicated: Secondary | ICD-10-CM | POA: Diagnosis not present

## 2021-07-06 HISTORY — DX: Peripheral vascular disease, unspecified: I73.9

## 2021-07-06 LAB — BASIC METABOLIC PANEL
Anion gap: 8 (ref 5–15)
BUN: 24 mg/dL — ABNORMAL HIGH (ref 6–20)
CO2: 26 mmol/L (ref 22–32)
Calcium: 9.2 mg/dL (ref 8.9–10.3)
Chloride: 107 mmol/L (ref 98–111)
Creatinine, Ser: 1.13 mg/dL (ref 0.61–1.24)
GFR, Estimated: >60 mL/min (ref >60)
Glucose, Bld: 97 mg/dL (ref 70–99)
Potassium: 4 mmol/L (ref 3.5–5.1)
Sodium: 141 mmol/L (ref 135–145)

## 2021-07-06 LAB — TROPONIN I (HIGH SENSITIVITY): Troponin I (High Sensitivity): 4 ng/L (ref <18)

## 2021-07-06 LAB — CBC
HCT: 46.9 % (ref 39.0–52.0)
Hemoglobin: 15.5 g/dL (ref 13.0–17.0)
MCH: 30.2 pg (ref 26.0–34.0)
MCHC: 33 g/dL (ref 30.0–36.0)
MCV: 91.4 fL (ref 80.0–100.0)
Platelets: 283 10*3/uL (ref 150–400)
RBC: 5.13 MIL/uL (ref 4.22–5.81)
RDW: 12.6 % (ref 11.5–15.5)
WBC: 10.3 10*3/uL (ref 4.0–10.5)
nRBC: 0 % (ref 0.0–0.2)

## 2021-07-06 LAB — CBG MONITORING, ED: Glucose-Capillary: 88 mg/dL (ref 70–99)

## 2021-07-06 MED ORDER — PREDNISONE 20 MG PO TABS: 40.0000 mg | ORAL_TABLET | Freq: Every day | ORAL | 0 refills | Status: DC

## 2021-07-06 MED ORDER — ASPIRIN 81 MG PO CHEW
324.0000 mg | CHEWABLE_TABLET | Freq: Once | ORAL | Status: AC
  Administered 2021-07-06: 324 mg via ORAL
  Filled 2021-07-06: qty 4

## 2021-07-06 MED ORDER — PREDNISONE 50 MG PO TABS
60.0000 mg | ORAL_TABLET | ORAL | Status: AC
  Administered 2021-07-06: 60 mg via ORAL
  Filled 2021-07-06: qty 1

## 2021-07-06 NOTE — ED Triage Notes (Signed)
Pt to the ED with complaints of left side chest pain with numbness radiating down his left arm into his hand.  Pt states it started on Tuesday but it is worse today.

## 2021-07-06 NOTE — ED Provider Notes (Signed)
[facility] Provider Note   CSN: [id number] Arrival date & time: 07/06/21  1318     History  Chief Complaint  Patient presents with   Chest Pain    Caleb Powell is a 51 y.o. male.  HPI Presents with his daughter who assists with history.  Patient has multiple medical issues most notably cigarette addiction, smoking 2.5 packs of cigarettes daily.  He was also recently diagnosed with PAD, and has a history of right carotid stent.  Over the past months he has had episodes of tingling and numbness in his left digits in an ulnar nerve distribution.  He is recently developed some pain in his left shoulder, without swelling, without consistency, without loss of function.  No true chest pain, in the anterior thorax.  No fever, vomiting.  He is scheduled for echocardiogram in 5 days, and vascular evaluation later this month.    Home Medications Prior to Admission medications   Medication Sig Start Date End Date Taking? Authorizing Provider  amLODipine (NORVASC) 5 MG tablet Take 1 tablet (5 mg total) by mouth daily. Patient not taking: Reported on 06/19/2021 04/17/20   Soyla Dryer, PA-C  aspirin 81 MG chewable tablet Chew 1 tablet (81 mg total) by mouth daily. 09/01/20   Audria Nine, DO  esomeprazole (NEXIUM) 40 MG capsule Take 1 capsule (40 mg total) by mouth 2 (two) times daily before a meal. 06/19/21 12/16/21  Carver, Elon Alas, DO  LORazepam (ATIVAN) 0.5 MG tablet Take 0.25 mg by mouth in the morning. 08/13/20   [provider]  olmesartan (BENICAR) 20 MG tablet Take 20 mg by mouth daily.    [provider]  OVER THE COUNTER MEDICATION Vitamin D3 with K2 once per day.    [provider]  OVER THE COUNTER MEDICATION Cinnamon daily.    [provider]  OVER THE COUNTER MEDICATION HCTZ 10 mg daily.    [provider]  rosuvastatin (CRESTOR) 10 MG tablet Take 10 mg by mouth every evening.    [provider]   vitamin B-12 (CYANOCOBALAMIN) 500 MCG tablet Take 500 mcg by mouth daily.    [provider]      Allergies    Penicillins    Review of Systems   Review of Systems  All other systems reviewed and are negative.   Physical Exam Updated Vital Signs BP 138/67   Pulse 70   Temp 98.1 F (36.7 C) (Oral)   Resp 20   Ht '5\' 8"'$  (1.727 m)   Wt 78 kg   SpO2 98%   BMI 26.14 kg/m  Physical Exam Vitals and nursing note reviewed.  Constitutional:      General: He is not in acute distress.    Appearance: He is well-developed.  HENT:     Head: Normocephalic and atraumatic.  Eyes:     Conjunctiva/sclera: Conjunctivae normal.  Cardiovascular:     Rate and Rhythm: Normal rate and regular rhythm.  Pulmonary:     Effort: Pulmonary effort is normal. No respiratory distress.     Breath sounds: No stridor.  Abdominal:     General: There is no distension.  Musculoskeletal:     Comments: No deformity, abnormality.  Skin:    General: Skin is warm and dry.  Neurological:     Mental Status: He is alert and oriented to person, place, and time.     Motor: No tremor.     Coordination: Coordination normal.  Comments: Though the patient describes numbness in an ulnar distribution of his left arm, he has preserved sensation and function of the digits, wrist in that same distribution.     ED Results / Procedures / Treatments   Labs (all labs ordered are listed, but only abnormal results are displayed) Labs Reviewed  BASIC METABOLIC PANEL - Abnormal; Notable for the following components:      Result Value   BUN 24 (*)    All other components within normal limits  CBC  CBG MONITORING, ED  TROPONIN I (HIGH SENSITIVITY)    EKG EKG Interpretation  Date/Time:  Saturday July 06 2021 13:26:55 EDT Ventricular Rate:  75 PR Interval:  146 QRS Duration: 98 QT Interval:  386 QTC Calculation: 432 R Axis:   59 Text Interpretation: Sinus rhythm Low voltage, precordial leads T wave  abnormality Abnormal ECG Confirmed by Carmin Muskrat [phone]) on 07/06/2021 2:47:51 PM  Radiology DG Chest Portable 1 View  Result Date: 07/06/2021 CLINICAL DATA:  Left-sided chest pain with numbness radiating into the left arm. EXAM: PORTABLE CHEST 1 VIEW COMPARISON:  Radiographs 08/28/2020 and 02/17/2020. FINDINGS: 1407 hours. Two views obtained. The heart size and mediastinal contours are normal. The lungs are clear. There is no pleural effusion or pneumothorax. No acute osseous findings are identified. Telemetry leads overlie the chest. IMPRESSION: Stable chest.  No evidence of active cardiopulmonary process. Electronically Signed   By: Richardean Sale M.D.   On: 07/06/2021 14:23    Procedures Procedures    Medications Ordered in ED Medications  aspirin chewable tablet 324 mg (324 mg Oral Given 07/06/21 1409)    ED Course/ Medical Decision Making/ A&P This patient with a Hx of cigarette addiction, PAD presents to the ED for concern of numbness and tingling in the left arm, pain in left shoulder, this involves an extensive number of treatment options, and is a complaint that carries with it a high risk of complications and morbidity.    The differential diagnosis includes peripheral neuropathy, atypical ACS, musculoskeletal etiology   Social Determinants of Health:  Cigarette addiction  Additional history obtained:  Additional history and/or information obtained from daughter, notable for noted above in HPI   After the initial evaluation, orders, including: Labs ECG were initiated.   Patient placed on Cardiac and Pulse-Oximetry Monitors. The patient was maintained on a cardiac monitor.  The cardiac monitored showed an rhythm of 70 sinus normal The patient was also maintained on pulse oximetry. The readings were typically 100% room air normal   On repeat evaluation of the patient stayed the same  Lab Tests:  I personally interpreted labs.  The pertinent results include: BUN 24  trace dehydration otherwise labs unremarkable  Imaging Studies ordered:  I independently visualized and interpreted imaging which showed no pneumonia, pneumothorax I agree with the radiologist interpretation   Dispostion / Final MDM:  After consideration of the diagnostic results and the patient's response to treatment, this adult male with cigarette addiction, peripheral vascular disease presents with left arm tingling in an ulnar nerve distribution, some left shoulder pain intermittently.  Is awake and alert, neurologically intact in all extremities including the concerning 1 today.  Patient has no evidence for proximal CNS dysfunction, similarly no evidence for atypical ACS with no actual thoracic pain, and nonischemic ECG, and an unremarkable troponin value though he does have elevated risk profile with smoking addiction.  Patient is appropriate for discharge to follow-up this week as scheduled, given his neuropathy, we discussed  appropriate outpatient follow-up for that as well, he will start a short course of steroids for inflammatory etiology presumptively.  Final Clinical Impression(s) / ED Diagnoses Final diagnoses:  Numbness      Carmin Muskrat, MD 07/06/21 1502

## 2021-07-06 NOTE — ED Notes (Signed)
Dc instructions and scripts reviewed with pt no questions or concerns at this time. Will follow up with pcp.  

## 2021-07-06 NOTE — Discharge Instructions (Addendum)
As discussed, your evaluation today has been largely reassuring.  But, it is important that you monitor your condition carefully, and do not hesitate to return to the ED if you develop new, or concerning changes in your condition. ? ?Otherwise, please follow-up with your physician for appropriate ongoing care. ? ?

## 2021-08-06 ENCOUNTER — Telehealth: Payer: Self-pay | Admitting: *Deleted

## 2021-08-06 NOTE — Telephone Encounter (Signed)
Received VM patient wanting to r/s procedure. Not seen since earlier this year. Please schedule OV thanks

## 2021-08-12 ENCOUNTER — Encounter: Payer: Self-pay | Admitting: *Deleted

## 2021-08-12 NOTE — Telephone Encounter (Signed)
OV schd 09/04/21 with Dr Abbey Chatters, apt letter mailed to patient

## 2021-08-19 ENCOUNTER — Telehealth: Payer: Self-pay | Admitting: *Deleted

## 2021-08-19 NOTE — Telephone Encounter (Signed)
Patient called wanting to reschedule colonoscopy from 2/23. Pt has an upcoming appt to discuss the colonoscopy. Pt was advised to keep follow up appt on 09/04/21. Pt verbalized understanding.

## 2021-09-04 ENCOUNTER — Ambulatory Visit (INDEPENDENT_AMBULATORY_CARE_PROVIDER_SITE_OTHER): Payer: Medicaid Other | Admitting: Internal Medicine

## 2021-09-04 ENCOUNTER — Encounter: Payer: Self-pay | Admitting: Internal Medicine

## 2021-09-04 VITALS — BP 180/82 | HR 80 | Temp 97.6°F | Ht 70.0 in | Wt 178.1 lb

## 2021-09-04 DIAGNOSIS — K219 Gastro-esophageal reflux disease without esophagitis: Secondary | ICD-10-CM

## 2021-09-04 DIAGNOSIS — Z1211 Encounter for screening for malignant neoplasm of colon: Secondary | ICD-10-CM | POA: Diagnosis not present

## 2021-09-04 DIAGNOSIS — R194 Change in bowel habit: Secondary | ICD-10-CM | POA: Diagnosis not present

## 2021-09-04 MED ORDER — PANTOPRAZOLE SODIUM 40 MG PO TBEC: 40.0000 mg | DELAYED_RELEASE_TABLET | Freq: Every day | ORAL | 5 refills | Status: DC

## 2021-09-04 NOTE — Patient Instructions (Signed)
We will schedule you for colonoscopy in October.  I think it is fine to have this done while on Plavix though if I find a large lesion, I will not be able to remove it at that time.  I am going to change your Nexium back to pantoprazole 40 mg daily.  Watch your blood pressure on this medication.  Further recommendations to follow.  Hope everything goes well with your vascular procedures.  It was nice seeing both you today.  Dr. Abbey Chatters

## 2021-09-04 NOTE — Progress Notes (Signed)
Referring Provider: Medicine, [facility]* Primary Care Physician:  Medicine, [facility] Primary GI:  Dr. Abbey Chatters  Chief Complaint  Patient presents with   Follow-up    Patient here today as a follow up on GERD and he also has some questions regarding a colonoscopy. He says gerd is not being controlled by his esomeprazole 40 mg bid, was on pantoprazole,but he says he was taken off of it due to elevation of bp due to taking it. He     HPI:   Caleb Powell is a 51 y.o. male who presents to clinic today for follow-up visit.  History of chronic sore throat and GERD.  Underwent EGD 01/14/2021 which showed LA grade a esophagitis, gastritis, duodenitis.  Biopsies negative for H. pylori.  Was placed on pantoprazole 40 mg twice daily.  States his symptoms actually improved though he recently stopped this medication 3 to 4 weeks ago as it was causing issues with his blood pressure.  Has tried lansoprazole and omeprazole in the past which did not help.  Was evaluated by ENT with normal flexible laryngoscopy.  Subsequently underwent CT neck with contrast which showed mild asymmetry of the vocal cords otherwise unremarkable.  Also had his thyroid evaluated which was unremarkable.  No dysphagia or odynophagia.  No chronic NSAID use.  Due for screening colonoscopy.  Canceled this prior due to issues with taking his blood pressure medications.  Scheduled for vascular procedure in the near future due to significant peripheral arterial disease.  He had a CT angio aorta which showed celiac artery tortuous but patent, normal SMA and normal IMA.  Today, continues to complain of abdominal pain.  Also with breakthrough acid reflux.  Feels like the Nexium does not work as well as the pantoprazole did.    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    Past Surgical History:  Procedure Laterality Date   BIOPSY  01/14/2021   Procedure: BIOPSY;  Surgeon: Eloise Harman, DO;  [facility];  Service: Endoscopy;;   ESOPHAGOGASTRODUODENOSCOPY (EGD) WITH PROPOFOL N/A 01/14/2021   Procedure: ESOPHAGOGASTRODUODENOSCOPY (EGD) WITH PROPOFOL;  Surgeon: Eloise Harman, DO;  Location: [facility];  Service: Endoscopy;  Laterality: N/A;  2:30pm   IR ANGIO INTRA EXTRACRAN SEL INTERNAL CAROTID BILAT MOD SED  08/30/2020   IR ANGIO VERTEBRAL SEL VERTEBRAL BILAT MOD SED  08/30/2020   IR INTRAVSC STENT CERV CAROTID W/EMB-PROT MOD SED INCL ANGIO  08/30/2020   IR US GUIDE VASC ACCESS RIGHT  08/30/2020   RADIOLOGY WITH ANESTHESIA N/A 08/30/2020   Procedure: IR WITH ANESTHESIA;  Surgeon: Pedro Earls, MD;  Location: [facility];  Service: Radiology;  Laterality: N/A;    Current Outpatient Medications  Medication Sig Dispense Refill   amLODipine (NORVASC) 5 MG tablet Take 1 tablet (5 mg total) by mouth daily. (Patient not taking: Reported on 06/19/2021) 30 tablet 3   aspirin 81 MG chewable tablet Chew 1 tablet (81 mg total) by mouth daily. 30 tablet 0   esomeprazole (NEXIUM) 40 MG capsule Take 1 capsule (40 mg total) by mouth 2 (two) times daily before a meal. 60 capsule 5   LORazepam (ATIVAN) 0.5 MG tablet Take 0.25 mg by mouth in the morning.  olmesartan (BENICAR) 20 MG tablet Take 20 mg by mouth daily.     OVER THE COUNTER MEDICATION Vitamin D3 with K2 once per day.     OVER THE COUNTER MEDICATION Cinnamon daily.     OVER THE COUNTER MEDICATION HCTZ 10 mg daily.     predniSONE (DELTASONE) 20 MG tablet Take 2 tablets (40 mg total) by mouth daily with breakfast. For the next four days 8 tablet 0   rosuvastatin (CRESTOR) 10 MG tablet Take 10 mg by mouth every evening.     vitamin B-12 (CYANOCOBALAMIN) 500 MCG tablet Take 500 mcg by mouth daily.     No current facility-administered medications for this visit.    Allergies as  of 09/04/2021 - Review Complete 09/04/2021  Allergen Reaction Noted   Penicillins Other (See Comments) 03/16/2014    Family History  Problem Relation Age of Onset   Diabetes Mother    Heart disease Mother    Stroke Mother    Hypertension Maternal Grandmother    Hypertension Maternal Grandfather    Heart disease Father     Social History   Socioeconomic History   Marital status: Legally Separated    Spouse name: Not on file   Number of children: Not on file   Years of education: Not on file   Highest education level: Not on file  Occupational History   Not on file  Tobacco Use   Smoking status: Every Day    Packs/day: 2.50    Years: 36.00    Total pack years: 90.00    Types: Cigarettes   Smokeless tobacco: Former  Scientific laboratory technician Use: Former   Devices: used for about one month  Substance and Sexual Activity   Alcohol use: Yes    Comment: rare   Drug use: No   Sexual activity: Yes    Birth control/protection: None  Other Topics Concern   Not on file  Social History Narrative   Not on file   Social Determinants of Health   Financial Resource Strain: Not on file  Food Insecurity: Not on file  Transportation Needs: Not on file  Physical Activity: Not on file  Stress: Not on file  Social Connections: Not on file    Subjective: Review of Systems  Constitutional:  Negative for chills and fever.  HENT:  Positive for sore throat. Negative for congestion and hearing loss.   Eyes:  Negative for blurred vision and double vision.  Respiratory:  Negative for cough and shortness of breath.   Cardiovascular:  Negative for chest pain and palpitations.  Gastrointestinal:  Positive for heartburn. Negative for abdominal pain, blood in stool, constipation, diarrhea, melena and vomiting.  Genitourinary:  Negative for dysuria and urgency.  Musculoskeletal:  Negative for joint pain and myalgias.  Skin:  Negative for itching and rash.  Neurological:  Negative for  dizziness and headaches.  Psychiatric/Behavioral:  Negative for depression. The patient is not nervous/anxious.      Objective: BP (!) 180/82 (BP Location: Left Arm, Patient Position: Sitting, Cuff Size: Large)   Pulse 80   Temp 97.6 F (36.4 C) (Oral)   Ht '5\' 10"'$  (1.778 m)   Wt 178 lb 1.6 oz (80.8 kg)   BMI 25.55 kg/m  Physical Exam Constitutional:      Appearance: Normal appearance.  HENT:     Head: Normocephalic and atraumatic.  Eyes:     Extraocular Movements: Extraocular movements intact.     Conjunctiva/sclera: Conjunctivae normal.  Cardiovascular:  Rate and Rhythm: Normal rate and regular rhythm.  Pulmonary:     Effort: Pulmonary effort is normal.     Breath sounds: Normal breath sounds.  Abdominal:     General: Bowel sounds are normal.     Palpations: Abdomen is soft.  Musculoskeletal:        General: Normal range of motion.     Cervical back: Normal range of motion and neck supple.  Skin:    General: Skin is warm.  Neurological:     General: No focal deficit present.     Mental Status: He is alert and oriented to person, place, and time.  Psychiatric:        Mood and Affect: Mood normal.        Behavior: Behavior normal.     Assessment: *GERD with esophagitis *Chronic sore throat *Colon cancer screening  Plan: Patient states his upper GI symptoms improved drastically on pantoprazole twice daily though he is stopped this medication as it was causing his blood pressure to worsen.  Previously trialed and failed omeprazole and lansoprazole with no improvement in symptoms.  Changed to esomeprazole on previous visit which is not helping as much.  He is willing to try pantoprazole again though at a lower dose.  We will send in pantoprazole 40 mg daily and see how he does.  Counseled to watch his blood pressure to see if this has an effect or not.  Patient is interested in undergoing colonoscopy.  Previously canceled as he was told he cannot take his  medications with applesauce morning of procedure.  Discussed case with anesthesia.  As long as patient takes his medications at least 6 hours before his procedure then we can proceed.  We will tentatively plan on doing this in 4 to 6 weeks given his pending vascular procedures.  Discussed that he will likely be placed on Plavix.  I think it is okay to proceed with colonoscopy on Plavix though if large lesion is found we may have to repeat at a later date and he understands.   Patient is agreeable  09/04/2021 11:15 AM   Disclaimer: This note was dictated with voice recognition software. Similar sounding words can inadvertently be transcribed and may not be corrected upon review.

## 2021-09-10 HISTORY — PX: ILIAC ARTERY STENT: SHX1786

## 2021-09-11 ENCOUNTER — Telehealth: Payer: Self-pay | Admitting: *Deleted

## 2021-09-11 NOTE — Telephone Encounter (Signed)
Called pt. He has been scheduled for TCS with Dr. Abbey Chatters 10/16 at 8am. Already has prep at home. Will send new instructions/pre-op appt. Before hanging up, pt said he had stents placed yesterday through [facility] and is now on plavix '75mg'$ . He stated he advised Dr. Abbey Chatters of this at his recent [place] and said it was fine to still schedule procedure in October. Please advise Dr. Abbey Chatters. Thanks!

## 2021-09-11 NOTE — Telephone Encounter (Signed)
Noted  

## 2021-09-11 NOTE — Telephone Encounter (Signed)
Okay to proceed.  Patient to NOT hold Plavix as this will primarily be diagnostic colonoscopy, he has agreed that if I find large lesion that needs removal he will need to undergo colonoscopy at a later date off Plavix.

## 2021-09-12 ENCOUNTER — Encounter: Payer: Self-pay | Admitting: *Deleted

## 2021-09-13 ENCOUNTER — Encounter: Payer: Self-pay | Admitting: *Deleted

## 2021-09-16 ENCOUNTER — Encounter: Payer: Self-pay | Admitting: *Deleted

## 2021-09-25 ENCOUNTER — Encounter (HOSPITAL_COMMUNITY)
Admission: RE | Admit: 2021-09-25 | Discharge: 2021-09-25 | Disposition: A | Discharge status: Home / Self Care | Payer: Medicaid Other | Source: Ambulatory Visit | Attending: Internal Medicine | Admitting: Internal Medicine

## 2021-09-25 ENCOUNTER — Encounter (HOSPITAL_COMMUNITY): Payer: Self-pay

## 2021-09-25 NOTE — Patient Instructions (Signed)
Chandon Lazcano Campise  09/25/2021     '@PREFPERIOPPHARMACY'$ @   Your procedure is scheduled on 09/30/2021.  Report to [name] at 10:30 A.M.  Call this number if you have problems the morning of surgery:  [phone]   Remember:    Please follow the diet and prep instructions given to you by [name] office.     Take these medicines the morning of surgery with A SIP OF WATER : Nexium and Ativan    Do not wear jewelry, make-up or nail polish.  Do not wear lotions, powders, or perfumes, or deodorant.  Do not shave 48 hours prior to surgery.  Men may shave face and neck.  Do not bring valuables to the hospital.  [facility] is not responsible for any belongings or valuables.  Contacts, dentures or bridgework may not be worn into surgery.  Leave your suitcase in the car.  After surgery it may be brought to your room.  For patients admitted to the hospital, discharge time will be determined by your treatment team.  Patients discharged the day of surgery will not be allowed to drive home.   Name and phone number of your driver:   Family Special instructions:  N/a  Please read over the following fact sheets that you were given. Care and Recovery After Surgery  Monitored Anesthesia Care Anesthesia refers to techniques, procedures, and medicines that help a person stay safe and comfortable during a medical or dental procedure. Monitored anesthesia care, or sedation, is one type of anesthesia. Your anesthesia specialist may recommend sedation if you will be having a procedure that does not require you to be unconscious. You may have this procedure for: Cataract surgery. A dental procedure. A biopsy. A colonoscopy. During the procedure, you may receive a medicine to help you relax (sedative). There are three levels of sedation: Mild sedation. At this level, you may feel awake and relaxed. You will be able to follow directions. Moderate sedation. At this level, you will be sleepy. You may  not remember the procedure. Deep sedation. At this level, you will be asleep. You will not remember the procedure. The more medicine you are given, the deeper your level of sedation will be. Depending on how you respond to the procedure, the anesthesia specialist may change your level of sedation or the type of anesthesia to fit your needs. An anesthesia specialist will monitor you closely during the procedure. Tell a health care provider about: Any allergies you have. All medicines you are taking, including vitamins, herbs, eye drops, creams, and over-the-counter medicines. Any problems you or family members have had with anesthetic medicines. Any blood disorders you have. Any surgeries you have had. Any medical conditions you have, such as sleep apnea. Whether you are pregnant or may be pregnant. Whether you use cigarettes, alcohol, or drugs. Any use of steroids, whether by mouth or as a cream. What are the risks? Generally, this is a safe procedure. However, problems may occur, including: Getting too much medicine (oversedation). Nausea. Allergic reaction to medicines. Trouble breathing. If this happens, a breathing tube may be used to help with breathing. It will be removed when you are awake and breathing on your own. Heart trouble. Lung trouble. Confusion that gets better with time (emergence delirium). What happens before the procedure? Staying hydrated Follow instructions from your health care provider about hydration, which may include: Up to 2 hours before the procedure - you may continue to drink clear liquids, such as water,  clear fruit juice, black coffee, and plain tea. Eating and drinking restrictions Follow instructions from your health care provider about eating and drinking, which may include: 8 hours before the procedure - stop eating heavy meals or foods, such as meat, fried foods, or fatty foods. 6 hours before the procedure - stop eating light meals or foods, such  as toast or cereal. 6 hours before the procedure - stop drinking milk or drinks that contain milk. 2 hours before the procedure - stop drinking clear liquids. Medicines Ask your health care provider about: Changing or stopping your regular medicines. This is especially important if you are taking diabetes medicines or blood thinners. Taking medicines such as aspirin and ibuprofen. These medicines can thin your blood. Do not take these medicines unless your health care provider tells you to take them. Taking over-the-counter medicines, vitamins, herbs, and supplements. Tests and exams You will have a physical exam. You may have blood tests done to show: How well your kidneys and liver are working. How well your blood can clot. General instructions Plan to have a responsible adult take you home from the hospital or clinic. If you will be going home right after the procedure, plan to have a responsible adult care for you for the time you are told. This is important. What happens during the procedure?  Your blood pressure, heart rate, breathing, level of pain, and overall condition will be monitored. An IV will be inserted into one of your veins. You will be given medicines as needed to keep you comfortable during the procedure. This may mean changing the level of sedation. Depending on your age or the procedure, the sedative may be given: As a pill that you will swallow or as a pill that is inserted into the rectum. As an injection into the vein or muscle. As a spray through the nose. The procedure will be performed. Your breathing, heart rate, and blood pressure will be monitored during the procedure. When the procedure is over, the medicine will be stopped. The procedure may vary among health care providers and hospitals. What happens after the procedure? Your blood pressure, heart rate, breathing rate, and blood oxygen level will be monitored until you leave the hospital or clinic. You  may feel sleepy, clumsy, or nauseous. You may feel forgetful about what happened after the procedure. You may vomit. You may continue to get IV fluids. Do not drive or operate machinery until your health care provider says that it is safe. Summary Monitored anesthesia care is used to keep a patient comfortable during short procedures. Tell your health care provider about any allergies or health conditions you have and about all the medicines you are taking. Before the procedure, follow instructions about when to stop eating and drinking and about changing or stopping any medicines. Your blood pressure, heart rate, breathing rate, and blood oxygen level will be monitored until you leave the hospital or clinic. Plan to have a responsible adult take you home from the hospital or clinic. This information is not intended to replace advice given to you by your health care provider. Make sure you discuss any questions you have with your health care provider. Document Revised: 11/27/2020 Document Reviewed: 11/25/2018 Elsevier Patient Education  [place]. Colonoscopy, Adult A colonoscopy is a procedure to look at the entire large intestine. This procedure is done using a long, thin, flexible tube that has a camera on the end. You may have a colonoscopy: As a part of normal  colorectal screening. If you have certain symptoms, such as: A low number of red blood cells in your blood (anemia). Diarrhea that does not go away. Pain in your abdomen. Blood in your stool. A colonoscopy can help screen for and diagnose medical problems, including: An abnormal growth of cells or tissue (tumor). Abnormal growths within the lining of your intestine (polyps). Inflammation. Areas of bleeding. Tell your health care provider about: Any allergies you have. All medicines you are taking, including vitamins, herbs, eye drops, creams, and over-the-counter medicines. Any problems you or family members have  had with anesthetic medicines. Any bleeding problems you have. Any surgeries you have had. Any medical conditions you have. Any problems you have had with having bowel movements. Whether you are pregnant or may be pregnant. What are the risks? Generally, this is a safe procedure. However, problems may occur, including: Bleeding. Damage to your intestine. Allergic reactions to medicines given during the procedure. Infection. This is rare. What happens before the procedure? Eating and drinking restrictions Follow instructions from your health care provider about eating or drinking restrictions, which may include: A few days before the procedure: Follow a low-fiber diet. Avoid nuts, seeds, dried fruit, raw fruits, and vegetables. 1-3 days before the procedure: Eat only gelatin dessert or ice pops. Drink only clear liquids, such as water, clear juice, clear broth or bouillon, black coffee or tea, or clear soft drinks or sports drinks. Avoid liquids that contain red or purple dye. The day of the procedure: Do not eat solid foods. You may continue to drink clear liquids until up to 2 hours before the procedure. Do not eat or drink anything starting 2 hours before the procedure, or within the time period that your health care provider recommends. Bowel prep If you were prescribed a bowel prep to take by mouth (orally) to clean out your colon: Take it as told by your health care provider. Starting the day before your procedure, you will need to drink a large amount of liquid medicine. The liquid will cause you to have many bowel movements of loose stool until your stool becomes almost clear or light green. If your skin or the opening between the buttocks (anus) gets irritated from diarrhea, you may relieve the irritation using: Wipes with medicine in them, such as adult wet wipes with aloe and vitamin E. A product to soothe skin, such as petroleum jelly. If you vomit while drinking the bowel  prep: Take a break for up to 60 minutes. Begin the bowel prep again. Call your health care provider if you keep vomiting or you cannot take the bowel prep without vomiting. To clean out your colon, you may also be given: Laxative medicines. These help you have a bowel movement. Instructions for enema use. An enema is liquid medicine injected into your rectum. Medicines Ask your health care provider about: Changing or stopping your regular medicines or supplements. This is especially important if you are taking iron supplements, diabetes medicines, or blood thinners. Taking medicines such as aspirin and ibuprofen. These medicines can thin your blood. Do not take these medicines unless your health care provider tells you to take them. Taking over-the-counter medicines, vitamins, herbs, and supplements. General instructions Ask your health care provider what steps will be taken to help prevent infection. These may include washing skin with a germ-killing soap. If you will be going home right after the procedure, plan to have a responsible adult: Take you home from the hospital or clinic. You will  not be allowed to drive. Care for you for the time you are told. What happens during the procedure?  An IV will be inserted into one of your veins. You will be given a medicine to make you fall asleep (general anesthetic). You will lie on your side with your knees bent. A lubricant will be put on the tube. Then the tube will be: Inserted into your anus. Gently eased through all parts of your large intestine. Air will be sent into your colon to keep it open. This may cause some pressure or cramping. Images will be taken with the camera and will appear on a screen. A small tissue sample may be removed to be looked at under a microscope (biopsy). The tissue may be sent to a lab for testing if any signs of problems are found. If small polyps are found, they may be removed and checked for cancer  cells. When the procedure is finished, the tube will be removed. The procedure may vary among health care providers and hospitals. What happens after the procedure? Your blood pressure, heart rate, breathing rate, and blood oxygen level will be monitored until you leave the hospital or clinic. You may have a small amount of blood in your stool. You may pass gas and have mild cramping or bloating in your abdomen. This is caused by the air that was used to open your colon during the exam. If you were given a sedative during the procedure, it can affect you for several hours. Do not drive or operate machinery until your health care provider says that it is safe. It is up to you to get the results of your procedure. Ask your health care provider, or the department that is doing the procedure, when your results will be ready. Summary A colonoscopy is a procedure to look at the entire large intestine. Follow instructions from your health care provider about eating and drinking before the procedure. If you were prescribed an oral bowel prep to clean out your colon, take it as told by your health care provider. During the colonoscopy, a flexible tube with a camera on its end is inserted into the anus and then passed into all parts of the large intestine. This information is not intended to replace advice given to you by your health care provider. Make sure you discuss any questions you have with your health care provider. Document Revised: 12/17/2020 Document Reviewed: 08/15/2020 Elsevier Patient Education  [place].

## 2021-09-30 ENCOUNTER — Telehealth: Payer: Self-pay | Admitting: *Deleted

## 2021-09-30 ENCOUNTER — Ambulatory Visit (HOSPITAL_COMMUNITY): Admission: RE | Admit: 2021-09-30 | Payer: Medicaid Other | Source: Home / Self Care

## 2021-09-30 ENCOUNTER — Encounter (HOSPITAL_COMMUNITY): Admission: RE | Payer: Self-pay | Source: Home / Self Care

## 2021-09-30 SURGERY — COLONOSCOPY WITH PROPOFOL
Anesthesia: Monitor Anesthesia Care

## 2021-09-30 NOTE — Telephone Encounter (Signed)
-----   Message from Wilmer Floor, RN sent at 09/30/2021  7:34 AM EDT ----- Regarding: Patient is sick and needs to reschedule Caleb Powell,  Mr Pesstik's daughter called and he is sick today and wants to reschedule  Thanks,  Rosalyn Gess RN

## 2021-10-16 ENCOUNTER — Other Ambulatory Visit (HOSPITAL_COMMUNITY): Payer: Medicaid Other

## 2021-11-08 ENCOUNTER — Encounter (HOSPITAL_COMMUNITY): Payer: Self-pay

## 2021-11-14 ENCOUNTER — Encounter: Payer: Self-pay | Admitting: Internal Medicine

## 2021-11-27 ENCOUNTER — Telehealth: Payer: Self-pay | Admitting: *Deleted

## 2021-11-27 NOTE — Telephone Encounter (Signed)
Pt's daughter called to reschedule her father's procedure. She says he has been placed on Plavix since September. Does he need an office visit or can we get clearance to hold the medication and schedule him?  Please advise.

## 2021-12-03 NOTE — Telephone Encounter (Signed)
I figured he would be placed on Plavix after his vascular procedures.  I did discuss this with him in clinic as well.  Okay to proceed with colonoscopy without another office visit.  Patient does not need to hold his Plavix.  Already counseled him that if we find a large lesion we may have to bring him back at a later date to remove lesion off Plavix.  Thank you

## 2021-12-04 NOTE — Telephone Encounter (Signed)
Pt states he only wants to do the colonoscopy one time. He doesn't want to have to come back to have another done and wants to know if he can go ahead and hold Plavix?  Please advise. Thank you

## 2021-12-05 NOTE — Telephone Encounter (Signed)
We will need to reach out to vascular in regards to holding his Plavix x 5 days.  He underwent iliac stent placement 09/10/2021.  Can we reach out to Sammuel Hines in regards to this?  I imagine he will need to be on Plavix for some time before we could hold it safely to protect the stent.

## 2021-12-06 NOTE — Telephone Encounter (Signed)
  Request for patient to stop medication prior to procedure or is needing cleareance  12/06/21  Caleb Powell 11-Aug-1970  What type of surgery is being performed? colonoscopy  When is surgery scheduled? TBD  What type of clearance is required (medical or pharmacy to hold medication or both? Hold medication  Are there any medications that need to be held prior to surgery and how long? Plavix x 5 days  Name of physician performing surgery?  [facility] [employer]: [phone] Fax: [phone]  Anethesia type (none, local, MAC, general)? MAC

## 2021-12-06 NOTE — Telephone Encounter (Signed)
Clearance faxed

## 2021-12-06 NOTE — Telephone Encounter (Signed)
.  rga 

## 2021-12-12 NOTE — Telephone Encounter (Signed)
Refaxed clearance.

## 2021-12-13 NOTE — Telephone Encounter (Signed)
Received clearance for pt to hold Plavix for 5 days. Clearance placed on your desk

## 2021-12-16 ENCOUNTER — Encounter: Payer: Self-pay | Admitting: *Deleted

## 2021-12-16 NOTE — Telephone Encounter (Signed)
Ok to schedule for procedure

## 2021-12-16 NOTE — Telephone Encounter (Signed)
Pt has been scheduled for 01/09/22 at 8:15 am. New instructions mailed to pt

## 2022-01-02 ENCOUNTER — Encounter: Payer: Self-pay | Admitting: *Deleted

## 2022-01-03 ENCOUNTER — Encounter (HOSPITAL_COMMUNITY)
Admission: RE | Admit: 2022-01-03 | Discharge: 2022-01-03 | Disposition: A | Discharge status: Home / Self Care | Payer: Medicaid Other | Source: Ambulatory Visit | Attending: Internal Medicine | Admitting: Internal Medicine

## 2022-01-03 VITALS — BP 145/79 | HR 87 | Temp 98.2°F | Resp 18 | Ht 69.0 in | Wt 170.0 lb

## 2022-01-03 DIAGNOSIS — Z01818 Encounter for other preprocedural examination: Secondary | ICD-10-CM

## 2022-01-03 NOTE — Patient Instructions (Signed)
Caleb Powell  01/03/2022     '@PREFPERIOPPHARMACY'$ @   Your procedure is scheduled on 01/09/2021.  Report to [name] at 6:30 A.M.  Call this number if you have problems the morning of surgery:  [phone]  If you experience any cold or flu symptoms such as cough, fever, chills, shortness of breath, etc. between now and your scheduled surgery, please notify Caleb Powell at the above number.   Remember:   Please follow the diet and prep instructions given to you by [name] office    Take these medicines the morning of surgery with A SIP OF WATER : Ativan    Do not wear jewelry, make-up or nail polish.  Do not wear lotions, powders, or perfumes, or deodorant.  Do not shave 48 hours prior to surgery.  Men may shave face and neck.  Do not bring valuables to the hospital.  [facility] is not responsible for any belongings or valuables.  Contacts, dentures or bridgework may not be worn into surgery.  Leave your suitcase in the car.  After surgery it may be brought to your room.  For patients admitted to the hospital, discharge time will be determined by your treatment team.  Patients discharged the day of surgery will not be allowed to drive home.   Name and phone number of your driver:   Family Special instructions:  N/a  Please read over the following fact sheets that you were given. Care and Recovery After Surgery  Colonoscopy, Adult A colonoscopy is a procedure to look at the entire large intestine. This procedure is done using a long, thin, flexible tube that has a camera on the end. You may have a colonoscopy: As a part of normal colorectal screening. If you have certain symptoms, such as: A low number of red blood cells in your blood (anemia). Diarrhea that does not go away. Pain in your abdomen. Blood in your stool. A colonoscopy can help screen for and diagnose medical problems, including: An abnormal growth of cells or tissue (tumor). Abnormal growths within the  lining of your intestine (polyps). Inflammation. Areas of bleeding. Tell your health care provider about: Any allergies you have. All medicines you are taking, including vitamins, herbs, eye drops, creams, and over-the-counter medicines. Any problems you or family members have had with anesthetic medicines. Any bleeding problems you have. Any surgeries you have had. Any medical conditions you have. Any problems you have had with having bowel movements. Whether you are pregnant or may be pregnant. What are the risks? Generally, this is a safe procedure. However, problems may occur, including: Bleeding. Damage to your intestine. Allergic reactions to medicines given during the procedure. Infection. This is rare. What happens before the procedure? Eating and drinking restrictions Follow instructions from your health care provider about eating or drinking restrictions, which may include: A few days before the procedure: Follow a low-fiber diet. Avoid nuts, seeds, dried fruit, raw fruits, and vegetables. 1-3 days before the procedure: Eat only gelatin dessert or ice pops. Drink only clear liquids, such as water, clear juice, clear broth or bouillon, black coffee or tea, or clear soft drinks or sports drinks. Avoid liquids that contain red or purple dye. The day of the procedure: Do not eat solid foods. You may continue to drink clear liquids until up to 2 hours before the procedure. Do not eat or drink anything starting 2 hours before the procedure, or within the time period that your health care provider recommends. Bowel  prep If you were prescribed a bowel prep to take by mouth (orally) to clean out your colon: Take it as told by your health care provider. Starting the day before your procedure, you will need to drink a large amount of liquid medicine. The liquid will cause you to have many bowel movements of loose stool until your stool becomes almost clear or light green. If your  skin or the opening between the buttocks (anus) gets irritated from diarrhea, you may relieve the irritation using: Wipes with medicine in them, such as adult wet wipes with aloe and vitamin E. A product to soothe skin, such as petroleum jelly. If you vomit while drinking the bowel prep: Take a break for up to 60 minutes. Begin the bowel prep again. Call your health care provider if you keep vomiting or you cannot take the bowel prep without vomiting. To clean out your colon, you may also be given: Laxative medicines. These help you have a bowel movement. Instructions for enema use. An enema is liquid medicine injected into your rectum. Medicines Ask your health care provider about: Changing or stopping your regular medicines or supplements. This is especially important if you are taking iron supplements, diabetes medicines, or blood thinners. Taking medicines such as aspirin and ibuprofen. These medicines can thin your blood. Do not take these medicines unless your health care provider tells you to take them. Taking over-the-counter medicines, vitamins, herbs, and supplements. General instructions Ask your health care provider what steps will be taken to help prevent infection. These may include washing skin with a germ-killing soap. If you will be going home right after the procedure, plan to have a responsible adult: Take you home from the hospital or clinic. You will not be allowed to drive. Care for you for the time you are told. What happens during the procedure?  An IV will be inserted into one of your veins. You will be given a medicine to make you fall asleep (general anesthetic). You will lie on your side with your knees bent. A lubricant will be put on the tube. Then the tube will be: Inserted into your anus. Gently eased through all parts of your large intestine. Air will be sent into your colon to keep it open. This may cause some pressure or cramping. Images will be taken  with the camera and will appear on a screen. A small tissue sample may be removed to be looked at under a microscope (biopsy). The tissue may be sent to a lab for testing if any signs of problems are found. If small polyps are found, they may be removed and checked for cancer cells. When the procedure is finished, the tube will be removed. The procedure may vary among health care providers and hospitals. What happens after the procedure? Your blood pressure, heart rate, breathing rate, and blood oxygen level will be monitored until you leave the hospital or clinic. You may have a small amount of blood in your stool. You may pass gas and have mild cramping or bloating in your abdomen. This is caused by the air that was used to open your colon during the exam. If you were given a sedative during the procedure, it can affect you for several hours. Do not drive or operate machinery until your health care provider says that it is safe. It is up to you to get the results of your procedure. Ask your health care provider, or the department that is doing the procedure, when your  results will be ready. Summary A colonoscopy is a procedure to look at the entire large intestine. Follow instructions from your health care provider about eating and drinking before the procedure. If you were prescribed an oral bowel prep to clean out your colon, take it as told by your health care provider. During the colonoscopy, a flexible tube with a camera on its end is inserted into the anus and then passed into all parts of the large intestine. This information is not intended to replace advice given to you by your health care provider. Make sure you discuss any questions you have with your health care provider. Document Revised: 12/17/2020 Document Reviewed: 08/15/2020 Elsevier Patient Education  [place] Anesthesia refers to the techniques, procedures, and medicines that help a  person stay safe and comfortable during surgery. Monitored anesthesia care, or sedation, is one type of anesthesia. You may have sedation if you do not need to be asleep for your procedure. Procedures that use sedation may include: Surgery to remove cataracts from your eyes. A dental procedure. A biopsy. This is when a tissue sample is removed and looked at under a microscope. You will be watched closely during your procedure. Your level of sedation or type of anesthesia may be changed to fit your needs. Tell a health care provider about: Any allergies you have. All medicines you are taking, including vitamins, herbs, eye drops, creams, and over-the-counter medicines. Any problems you or family members have had with anesthesia. Any bleeding problems you have. Any surgeries you have had. Any medical conditions or illnesses you have. This includes sleep apnea, cough, fever, or the flu. Whether you are pregnant or may be pregnant. Whether you use cigarettes, alcohol, or drugs. Any use of steroids, whether by mouth or as a cream. What are the risks? Your health care provider will talk with you about risks. These may include: Getting too much medicine (oversedation). Nausea. Allergic reactions to medicines. Trouble breathing. If this happens, a breathing tube may be used to help you breathe. It will be removed when you are awake and breathing on your own. Heart trouble. Lung trouble. Confusion that gets better with time (emergence delirium). What happens before the procedure? When to stop eating and drinking Follow instructions from your health care provider about what you may eat and drink. These may include: 8 hours before your procedure Stop eating most foods. Do not eat meat, fried foods, or fatty foods. Eat only light foods, such as toast or crackers. All liquids are okay except energy drinks and alcohol. 6 hours before your procedure Stop eating. Drink only clear liquids, such as  water, clear fruit juice, black coffee, plain tea, and sports drinks. Do not drink energy drinks or alcohol. 2 hours before your procedure Stop drinking all liquids. You may be allowed to take medicines with small sips of water. If you do not follow your health care provider's instructions, your procedure may be delayed or canceled. Medicines Ask your health care provider about: Changing or stopping your regular medicines. These include any diabetes medicines or blood thinners you take. Taking medicines such as aspirin and ibuprofen. These medicines can thin your blood. Do not take them unless your health care provider tells you to. Taking over-the-counter medicines, vitamins, herbs, and supplements. Testing You may have an exam or testing. You may have a blood or urine sample taken. General instructions Do not use any products that contain nicotine or tobacco for at least 4  weeks before the procedure. These products include cigarettes, chewing tobacco, and vaping devices, such as e-cigarettes. If you need help quitting, ask your health care provider. If you will be going home right after the procedure, plan to have a responsible adult: Take you home from the hospital or clinic. You will not be allowed to drive. Care for you for the time you are told. What happens during the procedure?  Your blood pressure, heart rate, breathing, level of pain, and blood oxygen level will be monitored. An IV will be inserted into one of your veins. You may be given: A sedative. This helps you relax. Anesthesia. This will: Numb certain areas of your body. Make you fall asleep for surgery. You will be given medicines as needed to keep you comfortable. The more medicine you are given, the deeper your level of sedation will be. Your level of sedation may be changed to fit your needs. There are three levels of sedation: Mild sedation. At this level, you may feel awake and relaxed. You will be able to follow  directions. Moderate sedation. At this level, you will be sleepy. You may not remember the procedure. Deep sedation. At this level, you will be asleep. You will not remember the procedure. How you get the medicines will depend on your age and the procedure. They may be given as: A pill. This may be taken by mouth (orally) or inserted into the rectum. An injection. This may be into a vein or muscle. A spray through the nose. After your procedure is over, the medicine will be stopped. The procedure may vary among health care providers and hospitals. What happens after the procedure? Your blood pressure, heart rate, breathing rate, and blood oxygen level will be monitored until you leave the hospital or clinic. You may feel sleepy, clumsy, or nauseous. You may not remember what happened during or after the procedure. Sedation can affect you for several hours. Do not drive or use machinery until your health care provider says that it is safe. This information is not intended to replace advice given to you by your health care provider. Make sure you discuss any questions you have with your health care provider. Document Revised: 05/20/2021 Document Reviewed: 05/20/2021 Elsevier Patient Education  [place].

## 2022-01-08 NOTE — Anesthesia Preprocedure Evaluation (Addendum)
Anesthesia Evaluation  Patient identified by MRN, date of birth, ID band Patient awake    Reviewed: Allergy & Precautions, NPO status , Patient's Chart, lab work & pertinent test results  Airway Mallampati: I  TM Distance: >3 FB Neck ROM: Full    Dental no notable dental hx. (+) Poor Dentition, Missing   Pulmonary shortness of breath, Current Smoker   Pulmonary exam normal breath sounds clear to auscultation       Cardiovascular hypertension, + Peripheral Vascular Disease  Normal cardiovascular exam Rhythm:Regular Rate:Normal - Diastolic murmurs FEMORAL ARTERY STENT   ILIAC ARTERY STENT carotid stent   Neuro/Psych  PSYCHIATRIC DISORDERS Anxiety     TIACVA    GI/Hepatic ,GERD  ,,  Endo/Other    Renal/GU      Musculoskeletal   Abdominal   Peds  Hematology   Anesthesia Other Findings   Reproductive/Obstetrics                             Anesthesia Physical Anesthesia Plan  ASA: 3  Anesthesia Plan: General   Post-op Pain Management:    Induction: Intravenous  PONV Risk Score and Plan:   Airway Management Planned: Natural Airway and Nasal Cannula  Additional Equipment: None  Intra-op Plan:   Post-operative Plan:   Informed Consent: I have reviewed the patients History and Physical, chart, labs and discussed the procedure including the risks, benefits and alternatives for the proposed anesthesia with the patient or authorized representative who has indicated his/her understanding and acceptance.       Plan Discussed with: CRNA  Anesthesia Plan Comments:        Anesthesia Quick Evaluation

## 2022-01-09 ENCOUNTER — Encounter (HOSPITAL_COMMUNITY)
Admission: RE | Disposition: A | Discharge status: Home / Self Care | Payer: Self-pay | Source: Home / Self Care | Attending: Internal Medicine

## 2022-01-09 ENCOUNTER — Ambulatory Visit (HOSPITAL_COMMUNITY)
Admission: RE | Admit: 2022-01-09 | Discharge: 2022-01-09 | Disposition: A | Discharge status: Home / Self Care | Payer: Medicaid Other | Attending: Internal Medicine | Admitting: Internal Medicine

## 2022-01-09 ENCOUNTER — Encounter (HOSPITAL_COMMUNITY): Payer: Self-pay

## 2022-01-09 ENCOUNTER — Ambulatory Visit (HOSPITAL_COMMUNITY): Payer: Medicaid Other | Admitting: Anesthesiology

## 2022-01-09 ENCOUNTER — Ambulatory Visit (HOSPITAL_BASED_OUTPATIENT_CLINIC_OR_DEPARTMENT_OTHER): Payer: Medicaid Other | Admitting: Anesthesiology

## 2022-01-09 DIAGNOSIS — K648 Other hemorrhoids: Secondary | ICD-10-CM | POA: Diagnosis not present

## 2022-01-09 DIAGNOSIS — Z1211 Encounter for screening for malignant neoplasm of colon: Secondary | ICD-10-CM | POA: Diagnosis not present

## 2022-01-09 DIAGNOSIS — F17210 Nicotine dependence, cigarettes, uncomplicated: Secondary | ICD-10-CM | POA: Diagnosis not present

## 2022-01-09 DIAGNOSIS — I10 Essential (primary) hypertension: Secondary | ICD-10-CM | POA: Diagnosis not present

## 2022-01-09 DIAGNOSIS — Z95820 Peripheral vascular angioplasty status with implants and grafts: Secondary | ICD-10-CM | POA: Diagnosis not present

## 2022-01-09 DIAGNOSIS — Z8673 Personal history of transient ischemic attack (TIA), and cerebral infarction without residual deficits: Secondary | ICD-10-CM | POA: Insufficient documentation

## 2022-01-09 DIAGNOSIS — I739 Peripheral vascular disease, unspecified: Secondary | ICD-10-CM | POA: Diagnosis not present

## 2022-01-09 DIAGNOSIS — D123 Benign neoplasm of transverse colon: Secondary | ICD-10-CM

## 2022-01-09 DIAGNOSIS — F419 Anxiety disorder, unspecified: Secondary | ICD-10-CM | POA: Insufficient documentation

## 2022-01-09 DIAGNOSIS — K219 Gastro-esophageal reflux disease without esophagitis: Secondary | ICD-10-CM | POA: Insufficient documentation

## 2022-01-09 HISTORY — PX: COLONOSCOPY WITH PROPOFOL: SHX5780

## 2022-01-09 HISTORY — PX: POLYPECTOMY: SHX5525

## 2022-01-09 SURGERY — COLONOSCOPY WITH PROPOFOL
Anesthesia: General

## 2022-01-09 MED ORDER — LIDOCAINE HCL (CARDIAC) PF 100 MG/5ML IV SOSY
PREFILLED_SYRINGE | INTRAVENOUS | Status: DC | PRN
  Administered 2022-01-09: 50 mg via INTRAVENOUS

## 2022-01-09 MED ORDER — LACTATED RINGERS IV SOLN: INTRAVENOUS | Status: DC

## 2022-01-09 MED ORDER — PROPOFOL 10 MG/ML IV BOLUS
INTRAVENOUS | Status: DC | PRN
  Administered 2022-01-09 (×2): 50 mg via INTRAVENOUS
  Administered 2022-01-09: 100 mg via INTRAVENOUS
  Administered 2022-01-09: 20 mg via INTRAVENOUS

## 2022-01-09 NOTE — Op Note (Signed)
[facility] Patient Name: Caleb Powell Procedure Date: 01/09/2022 8:10 AM MRN: [id number] Date of Birth: 1970/01/23 Attending MD: Elon Alas. Abbey Chatters , [place], [id number] CSN: [id number] Age: 52 Admit Type: Outpatient Procedure:                Colonoscopy Indications:              Screening for colorectal malignant neoplasm Providers:                Elon Alas. Abbey Chatters, DO, Janeece Riggers, RN, Ladoris Gene                            Technician, Technician Referring MD:              Medicines:                See the Anesthesia note for documentation of the                            administered medications Complications:            No immediate complications. Estimated Blood Loss:     Estimated blood loss was minimal. Procedure:                Pre-Anesthesia Assessment:                           - The anesthesia plan was to use monitored                            anesthesia care (MAC).                           After obtaining informed consent, the colonoscope                            was passed under direct vision. Throughout the                            procedure, the patient's blood pressure, pulse, and                            oxygen saturations were monitored continuously. The                            [id number]-HQ190L ([id number]) scope was introduced through                            the anus and advanced to the the cecum, identified                            by appendiceal orifice and ileocecal valve. The                            colonoscopy was performed without difficulty. The                            patient tolerated the procedure  well. The quality                            of the bowel preparation was evaluated using the                            BBPS [facility] Bowel Preparation Scale) with scores                            of: Right Colon = 3, Transverse Colon = 3 and Left                            Colon = 3 (entire mucosa seen well with no residual                             staining, small fragments of stool or opaque                            liquid). The total BBPS score equals 9. Scope In: 8:22:44 AM Scope Out: 8:42:45 AM Scope Withdrawal Time: 0 hours 18 minutes 11 seconds  Total Procedure Duration: 0 hours 20 minutes 1 second  Findings:      The perianal and digital rectal examinations were normal.      Non-bleeding internal hemorrhoids were found during endoscopy.      An 8 mm polyp was found in the transverse colon. The polyp was sessile.       The polyp was removed with a cold snare. Resection and retrieval were       complete.      A 12 mm polyp was found in the transverse colon. The polyp was sessile.       The polyp was removed with a cold snare. Resection and retrieval were       complete.      The terminal ileum appeared normal.      The exam was otherwise without abnormality. Impression:               - Non-bleeding internal hemorrhoids.                           - One 8 mm polyp in the transverse colon, removed                            with a cold snare. Resected and retrieved.                           - One 12 mm polyp in the transverse colon, removed                            with a cold snare. Resected and retrieved.                           - The examined portion of the ileum was normal.                           -  The examination was otherwise normal. Moderate Sedation:      Per Anesthesia Care Recommendation:           - Patient has a contact number available for                            emergencies. The signs and symptoms of potential                            delayed complications were discussed with the                            patient. Return to normal activities tomorrow.                            Written discharge instructions were provided to the                            patient.                           - Resume previous diet.                           - Continue present medications.                            - Await pathology results.                           - Repeat colonoscopy in 3 years for surveillance.                           - Return to GI clinic in 3 months. Procedure Code(s):        --- Professional ---                           [phone], Colonoscopy, flexible; with removal of                            tumor(s), polyp(s), or other lesion(s) by snare                            technique Diagnosis Code(s):        --- Professional ---                           Z12.11, Encounter for screening for malignant                            neoplasm of colon                           D12.3, Benign neoplasm of transverse colon (hepatic                            flexure or splenic flexure)  K64.8, Other hemorrhoids CPT copyright 2022 American Medical Association. All rights reserved. The codes documented in this report are preliminary and upon coder review may  be revised to meet current compliance requirements. Elon Alas. Abbey Chatters, DO [place] Abbey Chatters, DO 01/09/2022 8:46:31 AM This report has been signed electronically. Number of Addenda: 0

## 2022-01-09 NOTE — Anesthesia Postprocedure Evaluation (Signed)
Anesthesia Post Note  Patient: Caleb Powell  Procedure(s) Performed: COLONOSCOPY WITH PROPOFOL POLYPECTOMY  Patient location during evaluation: Phase II Anesthesia Type: General Level of consciousness: awake and alert Pain management: pain level controlled Vital Signs Assessment: post-procedure vital signs reviewed and stable Respiratory status: spontaneous breathing, nonlabored ventilation, respiratory function stable and patient connected to nasal cannula oxygen Cardiovascular status: blood pressure returned to baseline and stable Postop Assessment: no apparent nausea or vomiting Anesthetic complications: no   There were no known notable events for this encounter.   Last Vitals:  Vitals:   01/09/22 0700 01/09/22 0847  BP: (!) 168/94 112/60  Pulse: 76 80  Resp: 12 16  Temp: 36.7 C 36.7 C  SpO2: 95% 94%    Last Pain:  Vitals:   01/09/22 0847  TempSrc: Oral  PainSc: 0-No pain                 Nicanor Alcon

## 2022-01-09 NOTE — Discharge Instructions (Addendum)
  Colonoscopy Discharge Instructions  Read the instructions outlined below and refer to this sheet in the next few weeks. These discharge instructions provide you with general information on caring for yourself after you leave the hospital. Your doctor may also give you specific instructions. While your treatment has been planned according to the most current medical practices available, unavoidable complications occasionally occur.   ACTIVITY You may resume your regular activity, but move at a slower pace for the next 24 hours.  Take frequent rest periods for the next 24 hours.  Walking will help get rid of the air and reduce the bloated feeling in your belly (abdomen).  No driving for 24 hours (because of the medicine (anesthesia) used during the test).   Do not sign any important legal documents or operate any machinery for 24 hours (because of the anesthesia used during the test).  NUTRITION Drink plenty of fluids.  You may resume your normal diet as instructed by your doctor.  Begin with a light meal and progress to your normal diet. Heavy or fried foods are harder to digest and may make you feel sick to your stomach (nauseated).  Avoid alcoholic beverages for 24 hours or as instructed.  MEDICATIONS You may resume your normal medications unless your doctor tells you otherwise.  WHAT YOU CAN EXPECT TODAY Some feelings of bloating in the abdomen.  Passage of more gas than usual.  Spotting of blood in your stool or on the toilet paper.  IF YOU HAD POLYPS REMOVED DURING THE COLONOSCOPY: No aspirin products for 7 days or as instructed.  No alcohol for 7 days or as instructed.  Eat a soft diet for the next 24 hours.  FINDING OUT THE RESULTS OF YOUR TEST Not all test results are available during your visit. If your test results are not back during the visit, make an appointment with your caregiver to find out the results. Do not assume everything is normal if you have not heard from your  caregiver or the medical facility. It is important for you to follow up on all of your test results.  SEEK IMMEDIATE MEDICAL ATTENTION IF: You have more than a spotting of blood in your stool.  Your belly is swollen (abdominal distention).  You are nauseated or vomiting.  You have a temperature over 101.  You have abdominal pain or discomfort that is severe or gets worse throughout the day.   Overall, your colon looked very healthy.  I did not see any active inflammation indicative of underlying inflammatory bowel disease such as ulcerative colitis or Crohn's disease.  The end portion of your small bowel called the terminal ileum also appeared normal.  You did have 2 polyps which I removed successfully.  1 was greater than a centimeter.  Await pathology results, my office will contact you.  Repeat colonoscopy for surveillance in 3 years.  Follow-up with GI in 3 months.  I hope you have a great rest of your week!  Elon Alas. Abbey Chatters, D.O. Gastroenterology and Hepatology [facility]

## 2022-01-09 NOTE — H&P (Signed)
Primary Care Physician:  Medicine, [facility] Gastroenterologist:  Dr. Abbey Chatters  Pre-Procedure History & Physical: HPI:  Caleb Powell is a 52 y.o. male is here for first ever colonoscopy for colon cancer screening purposes.  Patient denies any family history of colorectal cancer.  No melena or hematochezia.  No abdominal pain or unintentional weight loss.  No change in bowel habits.  Overall feels well from a GI standpoint.  Past Medical History:  Diagnosis Date   Anxiety    Dyspnea    GERD (gastroesophageal reflux disease)    Hypertension    PAD (peripheral artery disease) ([place])    Stroke [facility])    eye stroke    Past Surgical History:  Procedure Laterality Date   BIOPSY  01/14/2021   Procedure: BIOPSY;  Surgeon: Eloise Harman, DO;  [facility];  Service: Endoscopy;;   carotid stent Right 08/2020   ESOPHAGOGASTRODUODENOSCOPY (EGD) WITH PROPOFOL N/A 01/14/2021   Procedure: ESOPHAGOGASTRODUODENOSCOPY (EGD) WITH PROPOFOL;  Surgeon: Eloise Harman, DO;  [facility];  Service: Endoscopy;  Laterality: N/A;  2:30pm   FEMORAL ARTERY STENT Bilateral    ILIAC ARTERY STENT Right 09/10/2021   IR ANGIO INTRA EXTRACRAN SEL INTERNAL CAROTID BILAT MOD SED  08/30/2020   IR ANGIO VERTEBRAL SEL VERTEBRAL BILAT MOD SED  08/30/2020   IR INTRAVSC STENT CERV CAROTID W/EMB-PROT MOD SED INCL ANGIO  08/30/2020   IR US GUIDE VASC ACCESS RIGHT  08/30/2020   RADIOLOGY WITH ANESTHESIA N/A 08/30/2020   Procedure: IR WITH ANESTHESIA;  Surgeon: Pedro Earls, MD;  Location: [place];  Service: Radiology;  Laterality: N/A;    Prior to Admission medications   Medication Sig Start Date End Date Taking? Authorizing Provider  amLODipine (NORVASC) 5 MG tablet Take 1 tablet (5 mg total) by mouth daily. 04/17/20  Yes Soyla Dryer, PA-C  aspirin 81 MG chewable tablet Chew 1 tablet (81 mg total) by mouth daily. 09/01/20  Yes Audria Nine, DO   CINNAMON PO Take 1 tablet by mouth 2 (two) times daily.   Yes [provider]  cloNIDine (CATAPRES) 0.2 MG tablet Take 0.2 mg by mouth 2 (two) times daily. 11/10/21  Yes [provider]  Cyanocobalamin (B-12 PO) Take 1 tablet by mouth daily.   Yes [provider]  esomeprazole (NEXIUM) 40 MG capsule Take 40 mg by mouth 2 (two) times daily.   Yes [provider]  LORazepam (ATIVAN) 0.5 MG tablet Take 0.25 mg by mouth in the morning. 08/13/20  Yes [provider]  olmesartan (BENICAR) 40 MG tablet Take 40 mg by mouth daily.   Yes [provider]  pantoprazole (PROTONIX) 20 MG tablet Take 20 mg by mouth daily. 10/11/21  Yes [provider]  pantoprazole (PROTONIX) 40 MG tablet Take 1 tablet (40 mg total) by mouth daily. 09/04/21 03/03/22 Yes Beckey Polkowski, Elon Alas, DO  rosuvastatin (CRESTOR) 10 MG tablet Take 10 mg by mouth every evening.   Yes [provider]  Vitamin D-Vitamin K (VITAMIN K2-VITAMIN D3 PO) Take 1 tablet by mouth daily.   Yes [provider]  clopidogrel (PLAVIX) 75 MG tablet Take 75 mg by mouth daily.    [provider]    Allergies as of 12/16/2021 - Review Complete 09/25/2021  Allergen Reaction Noted   Penicillins Other (See Comments) 03/16/2014    Family History  Problem Relation Age of Onset   Diabetes Mother    Heart disease  Mother    Stroke Mother    Hypertension Maternal Grandmother    Hypertension Maternal Grandfather    Heart disease Father     Social History   Socioeconomic History   Marital status: Legally Separated    Spouse name: Not on file   Number of children: Not on file   Years of education: Not on file   Highest education level: Not on file  Occupational History   Not on file  Tobacco Use   Smoking status: Every Day    Packs/day: 2.50    Years: 36.00    Total pack years: 90.00    Types: Cigarettes   Smokeless tobacco: Former  Scientific laboratory technician Use: Former    Devices: used for about one month  Substance and Sexual Activity   Alcohol use: Yes    Comment: rare   Drug use: No   Sexual activity: Yes    Birth control/protection: None  Other Topics Concern   Not on file  Social History Narrative   ** Merged History Encounter **       Social Determinants of Health   Financial Resource Strain: Not on file  Food Insecurity: Not on file  Transportation Needs: Not on file  Physical Activity: Not on file  Stress: Not on file  Social Connections: Not on file  Intimate Partner Violence: Not on file    Review of Systems: See HPI, otherwise negative ROS  Physical Exam: Vital signs in last 24 hours: Temp:  [98 F (36.7 C)] 98 F (36.7 C) (01/04 0700) Pulse Rate:  [76] 76 (01/04 0700) Resp:  [12] 12 (01/04 0700) BP: (168)/(94) 168/94 (01/04 0700) SpO2:  [95 %] 95 % (01/04 0700) Weight:  [77.1 kg] 77.1 kg (01/04 0700)   General:   Alert,  Well-developed, well-nourished, pleasant and cooperative in NAD Head:  Normocephalic and atraumatic. Eyes:  Sclera clear, no icterus.   Conjunctiva pink. Ears:  Normal auditory acuity. Nose:  No deformity, discharge,  or lesions. Msk:  Symmetrical without gross deformities. Normal posture. Extremities:  Without clubbing or edema. Neurologic:  Alert and  oriented x4;  grossly normal neurologically. Skin:  Intact without significant lesions or rashes. Psych:  Alert and cooperative. Normal mood and affect.  Impression/Plan: Caleb Powell is here for a colonoscopy to be performed for colon cancer screening purposes.  The risks of the procedure including infection, bleed, or perforation as well as benefits, limitations, alternatives and imponderables have been reviewed with the patient. Questions have been answered. All parties agreeable.

## 2022-01-09 NOTE — Transfer of Care (Signed)
Immediate Anesthesia Transfer of Care Note  Patient: Caleb Powell  Procedure(s) Performed: COLONOSCOPY WITH PROPOFOL POLYPECTOMY  Patient Location: PACU  Anesthesia Type:General  Level of Consciousness: awake  Airway & Oxygen Therapy: Patient Spontanous Breathing  Post-op Assessment: Report given to RN and Post -op Vital signs reviewed and stable  Post vital signs: Reviewed and stable  Last Vitals:  Vitals Value Taken Time  BP    Temp    Pulse    Resp    SpO2      Last Pain:  Vitals:   01/09/22 0700  TempSrc: Oral  PainSc: 0-No pain      Patients Stated Pain Goal: 9 (01/60/10 9323)  Complications: No notable events documented.

## 2022-01-10 LAB — SURGICAL PATHOLOGY

## 2022-01-13 ENCOUNTER — Other Ambulatory Visit (HOSPITAL_COMMUNITY): Payer: Self-pay | Admitting: Neuroradiology

## 2022-01-13 DIAGNOSIS — I771 Stricture of artery: Secondary | ICD-10-CM

## 2022-01-15 ENCOUNTER — Encounter (HOSPITAL_COMMUNITY): Payer: Self-pay | Admitting: Internal Medicine

## 2022-01-23 ENCOUNTER — Ambulatory Visit (HOSPITAL_COMMUNITY): Admission: RE | Admit: 2022-01-23 | Payer: Medicaid Other | Source: Ambulatory Visit

## 2022-02-05 ENCOUNTER — Ambulatory Visit (INDEPENDENT_AMBULATORY_CARE_PROVIDER_SITE_OTHER): Payer: Medicaid Other | Admitting: Internal Medicine

## 2022-02-05 ENCOUNTER — Encounter: Payer: Self-pay | Admitting: Internal Medicine

## 2022-02-05 VITALS — BP 127/78 | HR 86 | Temp 97.8°F | Ht 69.0 in | Wt 176.6 lb

## 2022-02-05 DIAGNOSIS — K219 Gastro-esophageal reflux disease without esophagitis: Secondary | ICD-10-CM | POA: Diagnosis not present

## 2022-02-05 DIAGNOSIS — D123 Benign neoplasm of transverse colon: Secondary | ICD-10-CM

## 2022-02-05 MED ORDER — PANTOPRAZOLE SODIUM 40 MG PO TBEC: 40.0000 mg | DELAYED_RELEASE_TABLET | Freq: Every day | ORAL | 11 refills | Status: DC

## 2022-02-05 NOTE — Patient Instructions (Signed)
I will put you back on the pantoprazole though we will do a smaller dose at 40 mg daily.  Watch your blood pressure at home and see how it affects this.  Would wait at least 30 minutes after taking your pantoprazole to take your blood pressure medications.  We will plan on repeat colonoscopy January 2027.  Follow-up in 3 to 4 months.  It was nice seeing both you again today.  Dr. Abbey Chatters

## 2022-02-05 NOTE — Progress Notes (Signed)
Referring Provider: Medicine, [facility] Primary Care Physician:  Medicine, [facility] Primary GI:  Dr. Abbey Chatters  Chief Complaint  Patient presents with   Gastroesophageal Reflux    Was switched from nexium to protonix. Patient reports he went back to nexium because protonix was causing elevated bp.     HPI:   Caleb Powell is a 52 y.o. male who presents to clinic today for follow-up visit.  History of chronic sore throat and GERD.  Underwent EGD 01/14/2021 which showed LA grade a esophagitis, gastritis, duodenitis.  Biopsies negative for H. pylori.  Was placed on pantoprazole 40 mg twice daily.  States his symptoms actually improved though he stopped this medication on his own as it was causing issues with his blood pressure.  Has tried lansoprazole and omeprazole in the past which did not help.  Was evaluated by ENT with normal flexible laryngoscopy.  Subsequently underwent CT neck with contrast which showed mild asymmetry of the vocal cords otherwise unremarkable.  Also had his thyroid evaluated which was unremarkable.  No dysphagia or odynophagia.  No chronic NSAID use.  Colonoscopy 01/09/2022 with multiple tubular adenomas removed.  Recommended recall 3 years.  Previously switched to Nexium today states he continues to have breakthrough acid reflux and throat burning.  States nothing worked as well as the pantoprazole.    Past Medical History:  Diagnosis Date   Anxiety    Dyspnea    GERD (gastroesophageal reflux disease)    Hypertension    PAD (peripheral artery disease) ([place])    Stroke [facility])    eye stroke    Past Surgical History:  Procedure Laterality Date   BIOPSY  01/14/2021   Procedure: BIOPSY;  Surgeon: Eloise Harman, DO;  [facility];  Service: Endoscopy;;   carotid stent Right 08/2020   COLONOSCOPY WITH PROPOFOL N/A 01/09/2022   Procedure: COLONOSCOPY WITH PROPOFOL;  Surgeon: Eloise Harman, DO;  [facility];   Service: Endoscopy;  Laterality: N/A;  8:15 AM   ESOPHAGOGASTRODUODENOSCOPY (EGD) WITH PROPOFOL N/A 01/14/2021   Procedure: ESOPHAGOGASTRODUODENOSCOPY (EGD) WITH PROPOFOL;  Surgeon: Eloise Harman, DO;  [facility];  Service: Endoscopy;  Laterality: N/A;  2:30pm   FEMORAL ARTERY STENT Bilateral    ILIAC ARTERY STENT Right 09/10/2021   IR ANGIO INTRA EXTRACRAN SEL INTERNAL CAROTID BILAT MOD SED  08/30/2020   IR ANGIO VERTEBRAL SEL VERTEBRAL BILAT MOD SED  08/30/2020   IR INTRAVSC STENT CERV CAROTID W/EMB-PROT MOD SED INCL ANGIO  08/30/2020   IR US GUIDE VASC ACCESS RIGHT  08/30/2020   POLYPECTOMY  01/09/2022   Procedure: POLYPECTOMY;  Surgeon: Eloise Harman, DO;  [facility];  Service: Endoscopy;;   RADIOLOGY WITH ANESTHESIA N/A 08/30/2020   Procedure: IR WITH ANESTHESIA;  Surgeon: Pedro Earls, MD;  Location: [place];  Service: Radiology;  Laterality: N/A;    Current Outpatient Medications  Medication Sig Dispense Refill   aspirin 81 MG chewable tablet Chew 1 tablet (81 mg total) by mouth daily. 30 tablet 0   CINNAMON PO Take 1 tablet by mouth 2 (two) times daily.     cloNIDine (CATAPRES) 0.2 MG tablet Take 0.2 mg by mouth 2 (two) times daily.     clopidogrel (PLAVIX) 75 MG tablet Take 75 mg by mouth daily.     Cyanocobalamin (B-12 PO) Take 1 tablet by mouth daily.     LORazepam (ATIVAN) 0.5 MG tablet Take  0.25 mg by mouth in the morning.     olmesartan (BENICAR) 40 MG tablet Take 40 mg by mouth daily.     rosuvastatin (CRESTOR) 10 MG tablet Take 10 mg by mouth every evening.     Vitamin D-Vitamin K (VITAMIN K2-VITAMIN D3 PO) Take 1 tablet by mouth daily.     amLODipine (NORVASC) 5 MG tablet Take 1 tablet (5 mg total) by mouth daily. (Patient not taking: Reported on 02/05/2022) 30 tablet 3   pantoprazole (PROTONIX) 20 MG tablet Take 20 mg by mouth daily. (Patient not taking: Reported on 02/05/2022)     No current facility-administered medications  for this visit.    Allergies as of 02/05/2022 - Review Complete 02/05/2022  Allergen Reaction Noted   Penicillins Other (See Comments) 03/16/2014    Family History  Problem Relation Age of Onset   Diabetes Mother    Heart disease Mother    Stroke Mother    Hypertension Maternal Grandmother    Hypertension Maternal Grandfather    Heart disease Father     Social History   Socioeconomic History   Marital status: Legally Separated    Spouse name: Not on file   Number of children: Not on file   Years of education: Not on file   Highest education level: Not on file  Occupational History   Not on file  Tobacco Use   Smoking status: Every Day    Packs/day: 2.50    Years: 36.00    Total pack years: 90.00    Types: Cigarettes    Passive exposure: Current   Smokeless tobacco: Former  Scientific laboratory technician Use: Former   Devices: used for about one month  Substance and Sexual Activity   Alcohol use: Yes    Comment: rare   Drug use: No   Sexual activity: Yes    Birth control/protection: None  Other Topics Concern   Not on file  Social History Narrative   ** Merged History Encounter **       Social Determinants of Health   Financial Resource Strain: Not on file  Food Insecurity: Not on file  Transportation Needs: Not on file  Physical Activity: Not on file  Stress: Not on file  Social Connections: Not on file    Subjective: Review of Systems  Constitutional:  Negative for chills and fever.  HENT:  Positive for sore throat. Negative for congestion and hearing loss.   Eyes:  Negative for blurred vision and double vision.  Respiratory:  Negative for cough and shortness of breath.   Cardiovascular:  Negative for chest pain and palpitations.  Gastrointestinal:  Positive for heartburn. Negative for abdominal pain, blood in stool, constipation, diarrhea, melena and vomiting.  Genitourinary:  Negative for dysuria and urgency.  Musculoskeletal:  Negative for joint pain and  myalgias.  Skin:  Negative for itching and rash.  Neurological:  Negative for dizziness and headaches.  Psychiatric/Behavioral:  Negative for depression. The patient is not nervous/anxious.      Objective: BP 127/78 (BP Location: Left Arm, Patient Position: Sitting, Cuff Size: Large)   Pulse 86   Temp 97.8 F (36.6 C) (Oral)   Ht '5\' 9"'$  (1.753 m)   Wt 176 lb 9.6 oz (80.1 kg)   BMI 26.08 kg/m  Physical Exam Constitutional:      Appearance: Normal appearance.  HENT:     Head: Normocephalic and atraumatic.  Eyes:     Extraocular Movements: Extraocular movements intact.  Conjunctiva/sclera: Conjunctivae normal.  Cardiovascular:     Rate and Rhythm: Normal rate and regular rhythm.  Pulmonary:     Effort: Pulmonary effort is normal.     Breath sounds: Normal breath sounds.  Abdominal:     General: Bowel sounds are normal.     Palpations: Abdomen is soft.  Musculoskeletal:        General: Normal range of motion.     Cervical back: Normal range of motion and neck supple.  Skin:    General: Skin is warm.  Neurological:     General: No focal deficit present.     Mental Status: He is alert and oriented to person, place, and time.  Psychiatric:        Mood and Affect: Mood normal.        Behavior: Behavior normal.      Assessment: *GERD with esophagitis *Chronic sore throat *Adenomatous colon polyps  Plan: Patient states his upper GI symptoms improved drastically on pantoprazole twice daily though he is stopped this medication as it was causing his blood pressure to worsen.  Previously trialed and failed omeprazole, esomeprazole, and lansoprazole with no improvement in symptoms.   He is willing to try pantoprazole again though at a lower dose.  We will send in pantoprazole 40 mg daily and see how he does.  Counseled to watch his blood pressure to see if this has an effect or not  Colonoscopy recall 2027  Follow up in 3-4 months  02/05/2022 3:19 PM   Disclaimer: This  note was dictated with voice recognition software. Similar sounding words can inadvertently be transcribed and may not be corrected upon review.

## 2022-02-06 ENCOUNTER — Telehealth (HOSPITAL_COMMUNITY): Payer: Self-pay

## 2022-02-06 ENCOUNTER — Ambulatory Visit: Payer: Medicaid Other | Admitting: Internal Medicine

## 2022-02-06 ENCOUNTER — Ambulatory Visit (HOSPITAL_COMMUNITY)
Admission: RE | Admit: 2022-02-06 | Discharge: 2022-02-06 | Disposition: A | Discharge status: Home / Self Care | Payer: Medicaid Other | Source: Ambulatory Visit | Attending: Neuroradiology | Admitting: Neuroradiology

## 2022-02-06 DIAGNOSIS — I771 Stricture of artery: Secondary | ICD-10-CM | POA: Insufficient documentation

## 2022-02-06 NOTE — Progress Notes (Signed)
VASCULAR LAB    Carotid duplex has been performed.  See CV proc for preliminary results.   Abreanna Drawdy, RVT 02/06/2022, 3:38 PM\

## 2022-02-06 NOTE — Telephone Encounter (Signed)
Pt agreed to f/u in 1 year with an ultrasound per Dr. Debbrah Alar. AB

## 2022-04-07 ENCOUNTER — Encounter: Payer: Self-pay | Admitting: Internal Medicine

## 2022-04-07 ENCOUNTER — Telehealth: Payer: Self-pay

## 2022-04-07 ENCOUNTER — Other Ambulatory Visit: Payer: Self-pay | Admitting: Internal Medicine

## 2022-04-07 MED ORDER — ESOMEPRAZOLE MAGNESIUM 40 MG PO CPDR: 40.0000 mg | DELAYED_RELEASE_CAPSULE | Freq: Every day | ORAL | 11 refills | Status: DC

## 2022-04-07 NOTE — Telephone Encounter (Signed)
Dr Abbey Chatters, Pt wants to switch back to Nexium due to blood pressure. He has a few days left of old Rx he is using. Please advise

## 2022-04-07 NOTE — Telephone Encounter (Signed)
Noted   Pt advised 

## 2022-04-07 NOTE — Telephone Encounter (Signed)
Nexium sent to pharmacy thank you

## 2022-04-10 ENCOUNTER — Ambulatory Visit: Payer: Medicaid Other | Admitting: Internal Medicine

## 2022-05-28 NOTE — Progress Notes (Deleted)
Referring Provider: Medicine, [facility]* Primary Care Physician:  Medicine, [facility] Primary GI Physician: Dr. Marletta Lor  No chief complaint on file.   HPI:   Caleb Powell is a 52 y.o. male with history of GERD, chronic sore throat.Underwent EGD 01/14/2021 which showed LA grade a esophagitis, gastritis, duodenitis.  Biopsies negative for H. pylori.  Was placed on pantoprazole 40 mg twice daily.  States his symptoms actually improved though he stopped this medication on his own as it was causing issues with his blood pressure.  Has tried lansoprazole and omeprazole in the past which did not help.   Was evaluated by ENT with normal flexible laryngoscopy.  Subsequently underwent CT neck with contrast which showed mild asymmetry of the vocal cords otherwise unremarkable.   Also had his thyroid evaluated which was unremarkable.  Last seen in our office 02/05/2022.  He was taking Nexium but continued with breakthrough reflux and throat burning.  Stated nothing worked as well as pantoprazole.  Plan to restart pantoprazole 40 mg daily, follow-up in 3-4 months.  Telephone call 04/07/2022 with patient requesting to go back to Nexium due to blood pressure concerns  Today:       Colonoscopy 01/09/2022 with multiple tubular adenomas removed. Recommended recall 3 years.   Past Medical History:  Diagnosis Date   Anxiety    Dyspnea    GERD (gastroesophageal reflux disease)    Hypertension    PAD (peripheral artery disease) (HCC)    Stroke [facility])    eye stroke    Past Surgical History:  Procedure Laterality Date   BIOPSY  01/14/2021   Procedure: BIOPSY;  Surgeon: Lanelle Bal, DO;  [facility];  Service: Endoscopy;;   carotid stent Right 08/2020   COLONOSCOPY WITH PROPOFOL N/A 01/09/2022   Procedure: COLONOSCOPY WITH PROPOFOL;  Surgeon: Lanelle Bal, DO;  [facility];  Service: Endoscopy;  Laterality: N/A;  8:15 AM    ESOPHAGOGASTRODUODENOSCOPY (EGD) WITH PROPOFOL N/A 01/14/2021   Procedure: ESOPHAGOGASTRODUODENOSCOPY (EGD) WITH PROPOFOL;  Surgeon: Lanelle Bal, DO;  [facility];  Service: Endoscopy;  Laterality: N/A;  2:30pm   FEMORAL ARTERY STENT Bilateral    ILIAC ARTERY STENT Right 09/10/2021   IR ANGIO INTRA EXTRACRAN SEL INTERNAL CAROTID BILAT MOD SED  08/30/2020   IR ANGIO VERTEBRAL SEL VERTEBRAL BILAT MOD SED  08/30/2020   IR INTRAVSC STENT CERV CAROTID W/EMB-PROT MOD SED INCL ANGIO  08/30/2020   IR US GUIDE VASC ACCESS RIGHT  08/30/2020   POLYPECTOMY  01/09/2022   Procedure: POLYPECTOMY;  Surgeon: Lanelle Bal, DO;  [facility];  Service: Endoscopy;;   RADIOLOGY WITH ANESTHESIA N/A 08/30/2020   Procedure: IR WITH ANESTHESIA;  Surgeon: Baldemar Lenis, MD;  Location: [facility] OR;  Service: Radiology;  Laterality: N/A;    Current Outpatient Medications  Medication Sig Dispense Refill   amLODipine (NORVASC) 5 MG tablet Take 1 tablet (5 mg total) by mouth daily. (Patient not taking: Reported on 02/05/2022) 30 tablet 3   aspirin 81 MG chewable tablet Chew 1 tablet (81 mg total) by mouth daily. 30 tablet 0   CINNAMON PO Take 1 tablet by mouth 2 (two) times daily.     cloNIDine (CATAPRES) 0.2 MG tablet Take 0.2 mg by mouth 2 (two) times daily.     clopidogrel (PLAVIX) 75 MG tablet Take 75 mg by mouth daily.     Cyanocobalamin (B-12 PO) Take 1 tablet  by mouth daily.     esomeprazole (NEXIUM) 40 MG capsule Take 1 capsule (40 mg total) by mouth daily. 30 capsule 11   LORazepam (ATIVAN) 0.5 MG tablet Take 0.25 mg by mouth in the morning.     olmesartan (BENICAR) 40 MG tablet Take 40 mg by mouth daily.     rosuvastatin (CRESTOR) 10 MG tablet Take 10 mg by mouth every evening.     Vitamin D-Vitamin K (VITAMIN K2-VITAMIN D3 PO) Take 1 tablet by mouth daily.     No current facility-administered medications for this visit.    Allergies as of 05/29/2022 - Review  Complete 02/05/2022  Allergen Reaction Noted   Penicillins Other (See Comments) 03/16/2014    Family History  Problem Relation Age of Onset   Diabetes Mother    Heart disease Mother    Stroke Mother    Hypertension Maternal Grandmother    Hypertension Maternal Grandfather    Heart disease Father     Social History   Socioeconomic History   Marital status: Legally Separated    Spouse name: Not on file   Number of children: Not on file   Years of education: Not on file   Highest education level: Not on file  Occupational History   Not on file  Tobacco Use   Smoking status: Every Day    Packs/day: 2.50    Years: 36.00    Additional pack years: 0.00    Total pack years: 90.00    Types: Cigarettes    Passive exposure: Current   Smokeless tobacco: Former  Building services engineer Use: Former   Devices: used for about one month  Substance and Sexual Activity   Alcohol use: Yes    Comment: rare   Drug use: No   Sexual activity: Yes    Birth control/protection: None  Other Topics Concern   Not on file  Social History Narrative   ** Merged History Encounter **       Social Determinants of Health   Financial Resource Strain: Not on file  Food Insecurity: Not on file  Transportation Needs: Not on file  Physical Activity: Not on file  Stress: Not on file  Social Connections: Not on file    Review of Systems: Gen: Denies fever, chills, anorexia. Denies fatigue, weakness, weight loss.  CV: Denies chest pain, palpitations, syncope, peripheral edema, and claudication. Resp: Denies dyspnea at rest, cough, wheezing, coughing up blood, and pleurisy. GI: Denies vomiting blood, jaundice, and fecal incontinence.   Denies dysphagia or odynophagia. Derm: Denies rash, itching, dry skin Psych: Denies depression, anxiety, memory loss, confusion. No homicidal or suicidal ideation.  Heme: Denies bruising, bleeding, and enlarged lymph nodes.  Physical Exam: There were no vitals taken  for this visit. General:   Alert and oriented. No distress noted. Pleasant and cooperative.  Head:  Normocephalic and atraumatic. Eyes:  Conjuctiva clear without scleral icterus. Heart:  S1, S2 present without murmurs appreciated. Lungs:  Clear to auscultation bilaterally. No wheezes, rales, or rhonchi. No distress.  Abdomen:  +BS, soft, non-tender and non-distended. No rebound or guarding. No HSM or masses noted. Msk:  Symmetrical without gross deformities. Normal posture. Extremities:  Without edema. Neurologic:  Alert and  oriented x4 Psych:  Normal mood and affect.    Assessment:     Plan:  ***   Ermalinda Memos, PA-C [facility]astroenter[facility] 05/29/2022

## 2022-05-29 ENCOUNTER — Encounter: Payer: Self-pay | Admitting: Gastroenterology

## 2022-05-29 ENCOUNTER — Ambulatory Visit: Payer: Medicaid Other | Admitting: Gastroenterology

## 2022-06-05 NOTE — Progress Notes (Signed)
Referring Provider: Medicine, [facility]* Primary Care Physician:  Medicine, [facility] Primary GI Physician: Dr. Marletta Lor  Chief Complaint  Patient presents with   Follow-up    Current ppi is not working    HPI:   Caleb Powell is a 52 y.o. male  with history of GERD, chronic sore throat.Underwent EGD 01/14/2021 which showed LA grade a esophagitis, gastritis, duodenitis.  Biopsies negative for H. pylori.  Was placed on pantoprazole 40 mg twice daily.  States his symptoms actually improved though he stopped this medication on his own as it was causing issues with his blood pressure.  Has tried lansoprazole and omeprazole in the past which did not help.   Was evaluated by ENT with normal flexible laryngoscopy.  Subsequently underwent CT neck with contrast which showed mild asymmetry of the vocal cords otherwise unremarkable.   Also had his thyroid evaluated which was unremarkable.   Last seen in our office 02/05/2022.  He was taking Nexium but continued with breakthrough reflux and throat burning.  Stated nothing worked as well as pantoprazole.  Plan to restart pantoprazole 40 mg daily, follow-up in 3-4 months.   Telephone call 04/07/2022 with patient requesting to go back to Nexium due to blood pressure concerns   Today: Having reflux symptoms daily taking Nexium twice daily.  Reports Protonix works very well for him, but causes his blood pressure to become elevated in the 190 systolic range.  Denies any routine abdominal pain.  Occasional nausea, but no vomiting.  No dysphagia.  No BRBPR or melena.  Eats fast food 3 meals a day.  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   Colonoscopy 01/09/2022 with multiple tubular adenomas removed. Recommended recall 3 years.     Past Medical History:  Diagnosis Date   Anxiety    Dyspnea    GERD (gastroesophageal reflux disease)    Hypertension    PAD (peripheral artery disease) (HCC)    Stroke  [facility])    eye stroke    Past Surgical History:  Procedure Laterality Date   BIOPSY  01/14/2021   Procedure: BIOPSY;  Surgeon: Lanelle Bal, DO;  [facility];  Service: Endoscopy;;   carotid stent Right 08/2020   COLONOSCOPY WITH PROPOFOL N/A 01/09/2022   Surgeon: Lanelle Bal, DO; multiple tubular adenomas removed. Recommended recall 3 years.   ESOPHAGOGASTRODUODENOSCOPY (EGD) WITH PROPOFOL N/A 01/14/2021   Surgeon: Lanelle Bal, DO;  LA grade a esophagitis, gastritis, duodenitis.  Biopsies negative for H. pylori.   FEMORAL ARTERY STENT Bilateral    ILIAC ARTERY STENT Right 09/10/2021   IR ANGIO INTRA EXTRACRAN SEL INTERNAL CAROTID BILAT MOD SED  08/30/2020   IR ANGIO VERTEBRAL SEL VERTEBRAL BILAT MOD SED  08/30/2020   IR INTRAVSC STENT CERV CAROTID W/EMB-PROT MOD SED INCL ANGIO  08/30/2020   IR US GUIDE VASC ACCESS RIGHT  08/30/2020   POLYPECTOMY  01/09/2022   Procedure: POLYPECTOMY;  Surgeon: Lanelle Bal, DO;  [facility];  Service: Endoscopy;;   RADIOLOGY WITH ANESTHESIA N/A 08/30/2020   Procedure: IR WITH ANESTHESIA;  Surgeon: Baldemar Lenis, MD;  Location: [facility] OR;  Service: Radiology;  Laterality: N/A;    Current Outpatient Medications  Medication Sig Dispense Refill   aspirin 81 MG chewable tablet Chew 1 tablet (81 mg total) by mouth daily. 30 tablet 0   CINNAMON PO Take 1 tablet by mouth 2 (two) times daily.  CLONIDINE HCL PO Take 0.2 mg by mouth as needed.     clopidogrel (PLAVIX) 75 MG tablet Take 75 mg by mouth daily.     Cyanocobalamin (B-12 PO) Take 1 tablet by mouth daily.     dexlansoprazole (DEXILANT) 60 MG capsule Take 1 capsule (60 mg total) by mouth daily. 30 capsule 5   LORazepam (ATIVAN) 0.5 MG tablet Take 0.25 mg by mouth in the morning.     olmesartan (BENICAR) 40 MG tablet Take 40 mg by mouth daily.     rosuvastatin (CRESTOR) 10 MG tablet Take 10 mg by mouth every evening.     Vitamin D-Vitamin K  (VITAMIN K2-VITAMIN D3 PO) Take 1 tablet by mouth daily.     No current facility-administered medications for this visit.    Allergies as of 06/06/2022 - Review Complete 06/06/2022  Allergen Reaction Noted   Amlodipine Swelling 10/22/2021   Penicillins Other (See Comments) 03/16/2014    Family History  Problem Relation Age of Onset   Diabetes Mother    Heart disease Mother    Stroke Mother    Hypertension Maternal Grandmother    Hypertension Maternal Grandfather    Heart disease Father     Social History   Socioeconomic History   Marital status: Legally Separated    Spouse name: Not on file   Number of children: Not on file   Years of education: Not on file   Highest education level: Not on file  Occupational History   Not on file  Tobacco Use   Smoking status: Every Day    Packs/day: 2.50    Years: 36.00    Additional pack years: 0.00    Total pack years: 90.00    Types: Cigarettes    Passive exposure: Current   Smokeless tobacco: Former  Building services engineer Use: Former   Devices: used for about one month  Substance and Sexual Activity   Alcohol use: Yes    Comment: rare   Drug use: No   Sexual activity: Yes    Birth control/protection: None  Other Topics Concern   Not on file  Social History Narrative   ** Merged History Encounter **       Social Determinants of Health   Financial Resource Strain: Not on file  Food Insecurity: Not on file  Transportation Needs: Not on file  Physical Activity: Not on file  Stress: Not on file  Social Connections: Not on file    Review of Systems: Gen: Denies fever, chills, cold or flulike symptoms, presyncope, syncope. CV: Denies chest pain, palpitations. Resp: Denies dyspnea, cough. GI: See HPI Heme: See HPI  Physical Exam: BP (!) 167/88 (BP Location: Right Arm, Patient Position: Sitting, Cuff Size: Normal)   Pulse 63   Temp (!) 97.3 F (36.3 C) (Oral)   Ht 5\' 9"  (1.753 m)   Wt 183 lb 9.6 oz (83.3 kg)    SpO2 98%   BMI 27.11 kg/m  General:   Alert and oriented. No distress noted. Pleasant and cooperative.  Head:  Normocephalic and atraumatic. Eyes:  Conjuctiva clear without scleral icterus. Heart:  S1, S2 present without murmurs appreciated. Lungs:  Clear to auscultation bilaterally. No wheezes, rales, or rhonchi. No distress.  Abdomen:  +BS, soft, non-tender and non-distended. No rebound or guarding. No HSM or masses noted. Msk:  Symmetrical without gross deformities. Normal posture. Extremities:  Without edema. Neurologic:  Alert and  oriented x4 Psych:  Normal mood and affect.  Assessment:  52 year old male with history of HTN, PAD, stroke, anxiety, GERD, presenting today for follow-up of GERD.  He is currently taking Nexium 40 mg twice daily but continues to have daily symptoms.  EGD in January 2023 with grade a esophagitis, gastritis, duodenitis, biopsies negative for H. pylori.  I suspect a lot of his symptoms are coming from his dietary choices as he is eating fast food 3 meals a day due to his job.  We discussed extensively the importance of limiting fried, fatty, greasy foods.  We will also try changing his PPI.  He has been on lansoprazole, omeprazole, pantoprazole.  Notably, pantoprazole works very well for him, but causes elevated blood pressure.  I will try him on Dexilant at this time.  We discussed the possibility of TIF.  He will consider this if we cannot get symptoms under control with medication/dietary changes.  Blood pressure was persistently elevated greater than 140/90 today.  Patient reports this is a chronic issue for him and he is following his PCP.  Advised he continue close follow-up to gain better control of HTN  We also discussed that his dietary choices may also be influencing his uncontrolled hypertension.   Plan:  Stop Nexium. Start Dexilant 60 mg daily. Follow a GERD diet:  Avoid fried, fatty, greasy, spicy, citrus foods. Avoid caffeine and carbonated  beverages. Avoid chocolate. Try eating 4-6 small meals a day rather than 3 large meals. Do not eat within 3 hours of laying down. Prop head of bed up on wood or bricks to create a 6 inch incline. Follow-up with PCP on hypertension management. Follow-up in 3 months or sooner if needed.   Ermalinda Memos, PA-C [facility]astroenter[facility] 06/06/2022

## 2022-06-06 ENCOUNTER — Encounter: Payer: Self-pay | Admitting: Gastroenterology

## 2022-06-06 ENCOUNTER — Ambulatory Visit: Payer: Medicaid Other | Admitting: Gastroenterology

## 2022-06-06 VITALS — BP 167/88 | HR 63 | Temp 97.3°F | Ht 69.0 in | Wt 183.6 lb

## 2022-06-06 DIAGNOSIS — I10 Essential (primary) hypertension: Secondary | ICD-10-CM | POA: Diagnosis not present

## 2022-06-06 DIAGNOSIS — K219 Gastro-esophageal reflux disease without esophagitis: Secondary | ICD-10-CM

## 2022-06-06 MED ORDER — DEXLANSOPRAZOLE 60 MG PO CPDR: 60.0000 mg | DELAYED_RELEASE_CAPSULE | Freq: Every day | ORAL | 5 refills | Status: DC

## 2022-06-06 NOTE — Patient Instructions (Addendum)
Stop Nexium.   Start Dexilant 60 mg daily 30 minutes before breakfast.   Follow a GERD diet/lifestyle:  Avoid fried, fatty, greasy, spicy, citrus foods. Avoid caffeine and carbonated beverages. Avoid chocolate. Try eating 4-6 small meals a day rather than 3 large meals. Do not eat within 3 hours of laying down. Prop head of bed up on wood or bricks to create a 6 inch incline.  I know your work schedule doesn't allow for many home cooked meals, but if you can choose the grilled options when eating out and avoid the fried foods, this will likely help your reflux as well as your blood pressure.   Continue to follow closely with your primary care doctor for management of your blood pressure.  Will follow-up with you in 3 months or sooner if needed.  It was good to meet you today!  Ermalinda Memos, PA-C [facility] Gastroenterology

## 2022-09-02 NOTE — Progress Notes (Deleted)
Referring Provider: Medicine, [facility]* Primary Care Physician:  Medicine, [facility] Primary GI Physician: Dr. Marletta Lor  No chief complaint on file.   HPI:   Caleb Powell is a 52 y.o. male with history of HTN, PAD, stroke, anxiety, GERD, presenting today for follow-up of GERD.  Last seen in our office for the same on 06/06/2022.  He was taking Nexium 40 mg twice daily but continued with reflux symptoms.  Suspected a lot of his symptoms were related to dietary choices as he was eating fast food 3 meals a day due to his job.  He was counseled extensively on the importance of a GERD diet.  Plan to try changing PPI.  Noted he had been on lansoprazole, omeprazole, pantoprazole in the past.  Noted pantoprazole worked well for him in the past, but caused elevated blood pressure.  Planned to start him on Dexilant.  Also discussed the possibility of TIF.  Patient stated he would consider this if not able to get symptoms under control with medication/dietary changes.  Today:   EGD 01/14/2021 which showed LA grade a esophagitis, gastritis, duodenitis. Biopsies negative for H. pylori.    Past Medical History:  Diagnosis Date   Anxiety    Dyspnea    GERD (gastroesophageal reflux disease)    Hypertension    PAD (peripheral artery disease) (HCC)    Stroke [facility])    eye stroke    Past Surgical History:  Procedure Laterality Date   BIOPSY  01/14/2021   Procedure: BIOPSY;  Surgeon: Lanelle Bal, DO;  [facility];  Service: Endoscopy;;   carotid stent Right 08/2020   COLONOSCOPY WITH PROPOFOL N/A 01/09/2022   Surgeon: Lanelle Bal, DO; multiple tubular adenomas removed. Recommended recall 3 years.   ESOPHAGOGASTRODUODENOSCOPY (EGD) WITH PROPOFOL N/A 01/14/2021   Surgeon: Lanelle Bal, DO;  LA grade a esophagitis, gastritis, duodenitis.  Biopsies negative for H. pylori.   FEMORAL ARTERY STENT Bilateral    ILIAC ARTERY STENT Right 09/10/2021   IR  ANGIO INTRA EXTRACRAN SEL INTERNAL CAROTID BILAT MOD SED  08/30/2020   IR ANGIO VERTEBRAL SEL VERTEBRAL BILAT MOD SED  08/30/2020   IR INTRAVSC STENT CERV CAROTID W/EMB-PROT MOD SED INCL ANGIO  08/30/2020   IR US GUIDE VASC ACCESS RIGHT  08/30/2020   POLYPECTOMY  01/09/2022   Procedure: POLYPECTOMY;  Surgeon: Lanelle Bal, DO;  [facility];  Service: Endoscopy;;   RADIOLOGY WITH ANESTHESIA N/A 08/30/2020   Procedure: IR WITH ANESTHESIA;  Surgeon: Baldemar Lenis, MD;  Location: [facility] OR;  Service: Radiology;  Laterality: N/A;    Current Outpatient Medications  Medication Sig Dispense Refill   aspirin 81 MG chewable tablet Chew 1 tablet (81 mg total) by mouth daily. 30 tablet 0   CINNAMON PO Take 1 tablet by mouth 2 (two) times daily.     CLONIDINE HCL PO Take 0.2 mg by mouth as needed.     clopidogrel (PLAVIX) 75 MG tablet Take 75 mg by mouth daily.     Cyanocobalamin (B-12 PO) Take 1 tablet by mouth daily.     dexlansoprazole (DEXILANT) 60 MG capsule Take 1 capsule (60 mg total) by mouth daily. 30 capsule 5   LORazepam (ATIVAN) 0.5 MG tablet Take 0.25 mg by mouth in the morning.     olmesartan (BENICAR) 40 MG tablet Take 40 mg by mouth daily.     rosuvastatin (CRESTOR) 10 MG tablet Take 10  mg by mouth every evening.     Vitamin D-Vitamin K (VITAMIN K2-VITAMIN D3 PO) Take 1 tablet by mouth daily.     No current facility-administered medications for this visit.    Allergies as of 09/04/2022 - Review Complete 06/06/2022  Allergen Reaction Noted   Amlodipine Swelling 10/22/2021   Penicillins Other (See Comments) 03/16/2014    Family History  Problem Relation Age of Onset   Diabetes Mother    Heart disease Mother    Stroke Mother    Hypertension Maternal Grandmother    Hypertension Maternal Grandfather    Heart disease Father     Social History   Socioeconomic History   Marital status: Legally Separated    Spouse name: Not on file   Number of  children: Not on file   Years of education: Not on file   Highest education level: Not on file  Occupational History   Not on file  Tobacco Use   Smoking status: Every Day    Current packs/day: 2.50    Average packs/day: 2.5 packs/day for 36.0 years (90.0 ttl pk-yrs)    Types: Cigarettes    Passive exposure: Current   Smokeless tobacco: Former  Building services engineer status: Former   Devices: used for about one month  Substance and Sexual Activity   Alcohol use: Yes    Comment: rare   Drug use: No   Sexual activity: Yes    Birth control/protection: None  Other Topics Concern   Not on file  Social History Narrative   ** Merged History Encounter **       Social Determinants of Health   Financial Resource Strain: Low Risk  (02/27/2022)   Received from [facility], [facility]   Overall Financial Resource Strain (CARDIA)    Difficulty of Paying Living Expenses: Not hard at all  Food Insecurity: No Food Insecurity (02/27/2022)   Received from [facility], [facility]   Hunger Vital Sign    Worried About Running Out of Food in the Last Year: Never true    Ran Out of Food in the Last Year: Never true  Transportation Needs: No Transportation Needs (02/27/2022)   Received from [facility], [facility]   PRAPARE - Transportation    Lack of Transportation (Medical): No    Lack of Transportation (Non-Medical): No  Physical Activity: Not on file  Stress: Stress Concern Present (08/30/2021)   Received from [facility], [employer] of Occupational [facility] - Occupational Stress Questionnaire    Feeling of Stress : To some extent  Social Connections: Unknown (05/06/2021)   Received from [facility], [facility]   Social Network    Social Network: Not on file    Review of Systems: Gen: Denies fever, chills, anorexia. Denies fatigue, weakness, weight loss.  CV: Denies chest pain, palpitations, syncope, peripheral edema, and claudication. Resp:  Denies dyspnea at rest, cough, wheezing, coughing up blood, and pleurisy. GI: Denies vomiting blood, jaundice, and fecal incontinence.   Denies dysphagia or odynophagia. Derm: Denies rash, itching, dry skin Psych: Denies depression, anxiety, memory loss, confusion. No homicidal or suicidal ideation.  Heme: Denies bruising, bleeding, and enlarged lymph nodes.  Physical Exam: There were no vitals taken for this visit. General:   Alert and oriented. No distress noted. Pleasant and cooperative.  Head:  Normocephalic and atraumatic. Eyes:  Conjuctiva clear without scleral icterus. Heart:  S1, S2 present without murmurs appreciated. Lungs:  Clear to auscultation bilaterally. No  wheezes, rales, or rhonchi. No distress.  Abdomen:  +BS, soft, non-tender and non-distended. No rebound or guarding. No HSM or masses noted. Msk:  Symmetrical without gross deformities. Normal posture. Extremities:  Without edema. Neurologic:  Alert and  oriented x4 Psych:  Normal mood and affect.    Assessment:     Plan:  ***   Ermalinda Memos, PA-C [facility] Gastroenterology 09/04/2022

## 2022-09-04 ENCOUNTER — Ambulatory Visit: Payer: Medicaid Other | Admitting: Gastroenterology

## 2022-09-04 ENCOUNTER — Encounter: Payer: Self-pay | Admitting: Gastroenterology

## 2022-09-05 ENCOUNTER — Ambulatory Visit: Payer: Medicaid Other | Admitting: Gastroenterology

## 2022-10-28 ENCOUNTER — Ambulatory Visit: Payer: Medicaid Other

## 2022-10-28 ENCOUNTER — Ambulatory Visit
Admission: EM | Admit: 2022-10-28 | Discharge: 2022-10-28 | Disposition: A | Discharge status: Home / Self Care | Payer: Medicaid Other | Attending: Nurse Practitioner | Admitting: Nurse Practitioner

## 2022-10-28 DIAGNOSIS — S9001XA Contusion of right ankle, initial encounter: Secondary | ICD-10-CM | POA: Diagnosis not present

## 2022-10-28 DIAGNOSIS — S99911A Unspecified injury of right ankle, initial encounter: Secondary | ICD-10-CM

## 2022-10-28 NOTE — Discharge Instructions (Addendum)
X-ray of the right ankle is pending.  You will be contacted only if the x-ray is abnormal.  You will also have access to the results via MyChart. An ankle brace has been provided to allow for additional compression and support.  Wear the brace when you are engaged in prolonged or strenuous activity. May take over-the-counter Tylenol or ibuprofen as needed for pain or discomfort. RICE therapy, rest, ice, compression, and elevation.  Apply ice for 20 minutes, remove for 1 hour, repeat is much as possible. Perform the ankle exercises at least 2-3 times daily to help decrease your recovery time. As discussed, if symptoms have not improved over the next 2 to 3 weeks, it is recommended that you follow-up with orthopedics for further evaluation. Follow-up as needed.

## 2022-10-28 NOTE — ED Triage Notes (Addendum)
Pt c/o right foot injury pt states he hurt his ankle x 2 weeks ago, pain when walking. Swelling and bruising is present on thew foot.   Pt was taking a lid off of a crate when the lid hit his ankle.

## 2022-10-28 NOTE — ED Provider Notes (Signed)
[facility]    CSN: [id number] Arrival date & time: 10/28/22  1317      History   Chief Complaint No chief complaint on file.   HPI Caleb Powell is a 52 y.o. male.   The history is provided by the patient.   Patient presents for complaints of right ankle pain after he hit the right ankle with a piece of plywood approximately 2 weeks ago.  Patient states since that time, he is experience bruising and swelling to the area.  He states that he has tenderness to the area right inside of the inner aspect of the right ankle bone.  Patient states now the pain is beginning to radiate up into the right lower leg.  He denies inability to bear weight, deformity, redness, or prior injury or trauma.  Patient reports he is not taking any medication or performed any interventions for his symptoms.  Past Medical History:  Diagnosis Date   Anxiety    Dyspnea    GERD (gastroesophageal reflux disease)    Hypertension    PAD (peripheral artery disease) (HCC)    Stroke (HCC)    eye stroke    Patient Active Problem List   Diagnosis Date Noted   Internal carotid artery stenosis, right 08/30/2020   Stenosis of carotid artery 08/30/2020   Hypertensive crisis 08/29/2020   Leukocytosis 08/29/2020   Chest pain 08/29/2020   TIA (transient ischemic attack) 08/28/2020   Hyperlipidemia 02/24/2018   Tobacco abuse 02/24/2018   Mood disorder (HCC) 02/24/2018    Past Surgical History:  Procedure Laterality Date   BIOPSY  01/14/2021   Procedure: BIOPSY;  Surgeon: Lanelle Bal, DO;  [facility];  Service: Endoscopy;;   carotid stent Right 08/2020   COLONOSCOPY WITH PROPOFOL N/A 01/09/2022   Surgeon: Lanelle Bal, DO; multiple tubular adenomas removed. Recommended recall 3 years.   ESOPHAGOGASTRODUODENOSCOPY (EGD) WITH PROPOFOL N/A 01/14/2021   Surgeon: Lanelle Bal, DO;  LA grade a esophagitis, gastritis, duodenitis.  Biopsies negative for H. pylori.   FEMORAL  ARTERY STENT Bilateral    ILIAC ARTERY STENT Right 09/10/2021   IR ANGIO INTRA EXTRACRAN SEL INTERNAL CAROTID BILAT MOD SED  08/30/2020   IR ANGIO VERTEBRAL SEL VERTEBRAL BILAT MOD SED  08/30/2020   IR INTRAVSC STENT CERV CAROTID W/EMB-PROT MOD SED INCL ANGIO  08/30/2020   IR US GUIDE VASC ACCESS RIGHT  08/30/2020   POLYPECTOMY  01/09/2022   Procedure: POLYPECTOMY;  Surgeon: Lanelle Bal, DO;  [facility];  Service: Endoscopy;;   RADIOLOGY WITH ANESTHESIA N/A 08/30/2020   Procedure: IR WITH ANESTHESIA;  Surgeon: Baldemar Lenis, MD;  Location: [facility] OR;  Service: Radiology;  Laterality: N/A;       Home Medications    Prior to Admission medications   Medication Sig Start Date End Date Taking? Authorizing Provider  aspirin 81 MG chewable tablet Chew 1 tablet (81 mg total) by mouth daily. 09/01/20   Briant Sites, DO  CINNAMON PO Take 1 tablet by mouth 2 (two) times daily.    [provider]  CLONIDINE HCL PO Take 0.2 mg by mouth as needed.    [provider]  clopidogrel (PLAVIX) 75 MG tablet Take 75 mg by mouth daily.    [provider]  Cyanocobalamin (B-12 PO) Take 1 tablet by mouth daily.    [provider]  dexlansoprazole (DEXILANT) 60 MG capsule Take 1 capsule (60 mg total) by mouth  daily. 06/06/22   Letta Median, PA-C  LORazepam (ATIVAN) 0.5 MG tablet Take 0.25 mg by mouth in the morning. 08/13/20   [provider]  olmesartan (BENICAR) 40 MG tablet Take 40 mg by mouth daily.    [provider]  rosuvastatin (CRESTOR) 10 MG tablet Take 10 mg by mouth every evening.    [provider]  Vitamin D-Vitamin K (VITAMIN K2-VITAMIN D3 PO) Take 1 tablet by mouth daily.    [provider]    Family History Family History  Problem Relation Age of Onset   Diabetes Mother    Heart disease Mother    Stroke Mother    Hypertension Maternal Grandmother    Hypertension Maternal  Grandfather    Heart disease Father     Social History Social History   Tobacco Use   Smoking status: Every Day    Current packs/day: 2.50    Average packs/day: 2.5 packs/day for 36.0 years (90.0 ttl pk-yrs)    Types: Cigarettes    Passive exposure: Current   Smokeless tobacco: Former  Building services engineer status: Former   Devices: used for about one month  Substance Use Topics   Alcohol use: Yes    Comment: rare   Drug use: No     Allergies   Amlodipine and Penicillins   Review of Systems Review of Systems Per HPI  Physical Exam Triage Vital Signs ED Triage Vitals  Encounter Vitals Group     BP 10/28/22 1328 (!) 169/70     Systolic BP Percentile --      Diastolic BP Percentile --      Pulse Rate 10/28/22 1328 70     Resp 10/28/22 1328 15     Temp 10/28/22 1328 98.4 F (36.9 C)     Temp src --      SpO2 10/28/22 1328 97 %     Weight --      Height --      Head Circumference --      Peak Flow --      Pain Score 10/28/22 1330 2     Pain Loc --      Pain Education --      Exclude from Growth Chart --    No data found.  Updated Vital Signs BP (!) 169/70 (BP Location: Right Arm)   Pulse 70   Temp 98.4 F (36.9 C)   Resp 15   SpO2 97%   Visual Acuity Right Eye Distance:   Left Eye Distance:   Bilateral Distance:    Right Eye Near:   Left Eye Near:    Bilateral Near:     Physical Exam Vitals and nursing note reviewed.  Constitutional:      General: He is not in acute distress.    Appearance: Normal appearance.  HENT:     Head: Normocephalic.  Musculoskeletal:     Right ankle: Swelling present. No deformity. Tenderness present over the medial malleolus. Normal range of motion. Normal pulse.     Comments: Area of tenderness noted approximately 1 cm above the medial malleolus of the right ankle.  There is bruising in multiple stages of healing noted to the medial aspect of the ankle that extends down the side of the right foot under the malleolus   There is no obvious deformity or erythema present.  Skin:    General: Skin is warm and dry.  Neurological:     General: No focal deficit  present.     Mental Status: He is alert and oriented to person, place, and time.  Psychiatric:        Mood and Affect: Mood normal.        Behavior: Behavior normal.      UC Treatments / Results  Labs (all labs ordered are listed, but only abnormal results are displayed) Labs Reviewed - No data to display  EKG   Radiology DG Ankle Complete Right  Result Date: 10/28/2022 CLINICAL DATA:  Right ankle pain after injury today. EXAM: RIGHT ANKLE - COMPLETE 3+ VIEW COMPARISON:  None Available. FINDINGS: There is no evidence of fracture, dislocation, or joint effusion. There is no evidence of arthropathy or other focal bone abnormality. Soft tissues are unremarkable. IMPRESSION: Negative. Electronically Signed   By: Lupita Raider M.D.   On: 10/28/2022 16:33    Procedures Procedures (including critical care time)  Medications Ordered in UC Medications - No data to display  Initial Impression / Assessment and Plan / UC Course  I have reviewed the triage vital signs and the nursing notes.  Pertinent labs & imaging results that were available during my care of the patient were reviewed by me and considered in my medical decision making (see chart for details).  X-ray of the right ankle is pending.  Suspect a contusion of the right ankle based on the mechanism of injury.  Patient was provided a lace up ankle brace to provide compression and support.  Supportive care recommendations were provided and discussed with the patient to include over-the-counter analgesics, and RICE therapy.  Patient was advised if symptoms do not improve over the next 2 to 3 weeks, it is recommended that he follow-up with orthopedics for further evaluation.  Patient was in agreement with this plan of care and verbalizes understanding.  All questions were answered.  Patient  stable for discharge.  Reviewed xray result of right ankle, xray is negative. No change in treatment plan.   Final Clinical Impressions(s) / UC Diagnoses   Final diagnoses:  Contusion of right ankle, initial encounter  Injury of right ankle, initial encounter     Discharge Instructions      X-ray of the right ankle is pending.  You will be contacted only if the x-ray is abnormal.  You will also have access to the results via MyChart. An ankle brace has been provided to allow for additional compression and support.  Wear the brace when you are engaged in prolonged or strenuous activity. May take over-the-counter Tylenol or ibuprofen as needed for pain or discomfort. RICE therapy, rest, ice, compression, and elevation.  Apply ice for 20 minutes, remove for 1 hour, repeat is much as possible. Perform the ankle exercises at least 2-3 times daily to help decrease your recovery time. As discussed, if symptoms have not improved over the next 2 to 3 weeks, it is recommended that you follow-up with orthopedics for further evaluation. Follow-up as needed.     ED Prescriptions   None    PDMP not reviewed this encounter.   Abran Cantor, NP 10/28/22 1654

## 2022-11-26 ENCOUNTER — Encounter: Payer: Self-pay | Admitting: Internal Medicine

## 2022-11-26 ENCOUNTER — Ambulatory Visit (INDEPENDENT_AMBULATORY_CARE_PROVIDER_SITE_OTHER): Payer: Medicaid Other | Admitting: Internal Medicine

## 2022-11-26 VITALS — BP 141/67 | HR 66 | Temp 97.8°F | Ht 69.0 in | Wt 178.3 lb

## 2022-11-26 DIAGNOSIS — Z860101 Personal history of adenomatous and serrated colon polyps: Secondary | ICD-10-CM

## 2022-11-26 DIAGNOSIS — R232 Flushing: Secondary | ICD-10-CM | POA: Diagnosis not present

## 2022-11-26 DIAGNOSIS — R5383 Other fatigue: Secondary | ICD-10-CM

## 2022-11-26 DIAGNOSIS — K2100 Gastro-esophageal reflux disease with esophagitis, without bleeding: Secondary | ICD-10-CM | POA: Diagnosis not present

## 2022-11-26 DIAGNOSIS — D123 Benign neoplasm of transverse colon: Secondary | ICD-10-CM

## 2022-11-26 NOTE — Progress Notes (Signed)
Referring Provider: Medicine, [facility]* Primary Care Physician:  [facility], [facility]:  Dr. Marletta Lor  Chief Complaint  Patient presents with   Gastroesophageal Reflux    Pt arrives to discuss GERD. Tried pantoprazole, nexium, omeprazole, and pepcid and states none have worked. All raised bp. Has dexilant but has not tried. Wants to discuss getting TIF procedure.     HPI:   Caleb Powell is a 52 y.o. male who presents to clinic today for follow-up visit for chronic reflux esophagitis.  History of chronic sore throat and GERD.    Underwent EGD 01/14/2021 which showed LA grade a esophagitis, gastritis, duodenitis.  Biopsies negative for H. pylori.    Was placed on pantoprazole 40 mg twice daily.  States his symptoms actually improved though he stopped this medication on his own as it was causing issues with his blood pressure.  Has tried lansoprazole and omeprazole in the past which did not help.  Was evaluated by ENT with normal flexible laryngoscopy.  Subsequently underwent CT neck with contrast which showed mild asymmetry of the vocal cords otherwise unremarkable.  Also had his thyroid evaluated which was unremarkable.  No dysphagia or odynophagia.  No chronic NSAID use.  Colonoscopy 01/09/2022 with multiple tubular adenomas removed.  Recommended recall 3 years.  Previously switched to esomeprazole and eventually dexlansoprazole.  Today states he continues to have breakthrough acid reflux and throat burning.  Has not started the dexlansoprazole however as he states every time he takes it his blood pressure skyrocket's.  He is interested in TIF.  Does note associated flushing and swelling of his throat and face at times.  He is wondering if this is related to his reflux.    Past Medical History:  Diagnosis Date   Anxiety    Dyspnea    GERD (gastroesophageal reflux disease)    Hypertension    PAD (peripheral artery disease) (HCC)    Stroke [facility])     eye stroke    Past Surgical History:  Procedure Laterality Date   BIOPSY  01/14/2021   Procedure: BIOPSY;  Surgeon: Lanelle Bal, DO;  [facility];  Service: Endoscopy;;   carotid stent Right 08/2020   COLONOSCOPY WITH PROPOFOL N/A 01/09/2022   Surgeon: Lanelle Bal, DO; multiple tubular adenomas removed. Recommended recall 3 years.   ESOPHAGOGASTRODUODENOSCOPY (EGD) WITH PROPOFOL N/A 01/14/2021   Surgeon: Lanelle Bal, DO;  LA grade a esophagitis, gastritis, duodenitis.  Biopsies negative for H. pylori.   FEMORAL ARTERY STENT Bilateral    ILIAC ARTERY STENT Right 09/10/2021   IR ANGIO INTRA EXTRACRAN SEL INTERNAL CAROTID BILAT MOD SED  08/30/2020   IR ANGIO VERTEBRAL SEL VERTEBRAL BILAT MOD SED  08/30/2020   IR INTRAVSC STENT CERV CAROTID W/EMB-PROT MOD SED INCL ANGIO  08/30/2020   IR US GUIDE VASC ACCESS RIGHT  08/30/2020   POLYPECTOMY  01/09/2022   Procedure: POLYPECTOMY;  Surgeon: Lanelle Bal, DO;  [facility];  Service: Endoscopy;;   RADIOLOGY WITH ANESTHESIA N/A 08/30/2020   Procedure: IR WITH ANESTHESIA;  Surgeon: Baldemar Lenis, MD;  Location: [facility] OR;  Service: Radiology;  Laterality: N/A;    Current Outpatient Medications  Medication Sig Dispense Refill   aspirin 81 MG chewable tablet Chew 1 tablet (81 mg total) by mouth daily. 30 tablet 0   CLONIDINE HCL PO Take by mouth as needed. Takes 0.2 mg along with 0.3 mg as needed tid  clopidogrel (PLAVIX) 75 MG tablet Take 75 mg by mouth daily.     Cyanocobalamin (B-12 PO) Take 1 tablet by mouth daily. Takes 3 days a week     LORazepam (ATIVAN) 0.5 MG tablet Take 0.25 mg by mouth in the morning.     olmesartan (BENICAR) 40 MG tablet Take 40 mg by mouth daily.     rosuvastatin (CRESTOR) 10 MG tablet Take 10 mg by mouth every evening.     Vitamin D-Vitamin K (VITAMIN K2-VITAMIN D3 PO) Take 1 tablet by mouth daily.     dexlansoprazole (DEXILANT) 60 MG capsule Take 1  capsule (60 mg total) by mouth daily. (Patient not taking: Reported on 11/26/2022) 30 capsule 5   No current facility-administered medications for this visit.    Allergies as of 11/26/2022 - Review Complete 11/26/2022  Allergen Reaction Noted   Amlodipine Swelling 10/22/2021   Penicillins Other (See Comments) 03/16/2014    Family History  Problem Relation Age of Onset   Diabetes Mother    Heart disease Mother    Stroke Mother    Hypertension Maternal Grandmother    Hypertension Maternal Grandfather    Heart disease Father     Social History   Socioeconomic History   Marital status: Legally Separated    Spouse name: Not on file   Number of children: Not on file   Years of education: Not on file   Highest education level: Not on file  Occupational History   Not on file  Tobacco Use   Smoking status: Every Day    Current packs/day: 2.50    Average packs/day: 2.5 packs/day for 36.0 years (90.0 ttl pk-yrs)    Types: Cigarettes    Passive exposure: Current   Smokeless tobacco: Former  Building services engineer status: Former   Devices: used for about one month  Substance and Sexual Activity   Alcohol use: Yes    Comment: rare   Drug use: No   Sexual activity: Yes    Birth control/protection: None  Other Topics Concern   Not on file  Social History Narrative   ** Merged History Encounter **       Social Determinants of Health   Financial Resource Strain: Low Risk  (02/27/2022)   Received from [facility], [facility]   Overall Financial Resource Strain (CARDIA)    Difficulty of Paying Living Expenses: Not hard at all  Food Insecurity: No Food Insecurity (02/27/2022)   Received from [facility], [facility]   Hunger Vital Sign    Worried About Running Out of Food in the Last Year: Never true    Ran Out of Food in the Last Year: Never true  Transportation Needs: No Transportation Needs (02/27/2022)   Received from [facility], [facility]   PRAPARE -  Transportation    Lack of Transportation (Medical): No    Lack of Transportation (Non-Medical): No  Physical Activity: Not on file  Stress: Stress Concern Present (08/30/2021)   Received from [facility], [facility] - Occupational Stress Questionnaire    Feeling of Stress : To some extent  Social Connections: Unknown (05/06/2021)   Received from [facility], [facility]   Social Network    Social Network: Not on file    Subjective: Review of Systems  Constitutional:  Negative for chills and fever.  HENT:  Positive for sore throat. Negative for congestion and hearing loss.   Eyes:  Negative for  blurred vision and double vision.  Respiratory:  Negative for cough and shortness of breath.   Cardiovascular:  Negative for chest pain and palpitations.  Gastrointestinal:  Positive for heartburn. Negative for abdominal pain, blood in stool, constipation, diarrhea, melena and vomiting.  Genitourinary:  Negative for dysuria and urgency.  Musculoskeletal:  Negative for joint pain and myalgias.  Skin:  Negative for itching and rash.  Neurological:  Negative for dizziness and headaches.  Psychiatric/Behavioral:  Negative for depression. The patient is not nervous/anxious.      Objective: BP (!) 141/67 (BP Location: Left Arm, Patient Position: Sitting, Cuff Size: Normal)   Pulse 66   Temp 97.8 F (36.6 C) (Oral)   Ht 5\' 9"  (1.753 m)   Wt 178 lb 4.8 oz (80.9 kg)   BMI 26.33 kg/m  Physical Exam Constitutional:      Appearance: Normal appearance.  HENT:     Head: Normocephalic and atraumatic.  Eyes:     Extraocular Movements: Extraocular movements intact.     Conjunctiva/sclera: Conjunctivae normal.  Cardiovascular:     Rate and Rhythm: Normal rate and regular rhythm.  Pulmonary:     Effort: Pulmonary effort is normal.     Breath sounds: Normal breath sounds.  Abdominal:     General: Bowel sounds are normal.     Palpations: Abdomen  is soft.  Musculoskeletal:        General: Normal range of motion.     Cervical back: Normal range of motion and neck supple.  Skin:    General: Skin is warm.  Neurological:     General: No focal deficit present.     Mental Status: He is alert and oriented to person, place, and time.  Psychiatric:        Mood and Affect: Mood normal.        Behavior: Behavior normal.      Assessment: *GERD with esophagitis *Flushing *Adenomatous colon polyps  Plan: Patient patient with uncontrolled GERD with esophagitis due to noncompliance with PPI.  When he takes PPI his symptoms do improve though he continually avoids this medication as he states his blood pressure will increase regularly if he takes it.  I discussed case further with Dr. Levon Hedger about role of possible TIF procedure.  We will schedule OV with him to further discuss.  Given the flushing and swelling, will rule out alpha gal.  Patient does note exposure to ticks.  Colonoscopy recall 2027    11/26/2022 2:08 PM   Disclaimer: This note was dictated with voice recognition software. Similar sounding words can inadvertently be transcribed and may not be corrected upon review.

## 2022-11-26 NOTE — Patient Instructions (Signed)
I am going to check you for alpha gal allergy, this is a blood test at [facility] lab.  We will call with results.  I will discuss your case further with my partner Dr. Levon Hedger to see if you would be a good candidate for possible TIF procedure.  If so, we will schedule appointment with him to discuss further.  It was good seeing you again today.  Dr. Marletta Lor

## 2022-11-28 ENCOUNTER — Encounter: Payer: Self-pay | Admitting: Internal Medicine

## 2022-11-28 ENCOUNTER — Other Ambulatory Visit: Payer: Self-pay | Admitting: Gastroenterology

## 2022-11-28 NOTE — Addendum Note (Signed)
Addended by: Zada Finders on: 11/28/2022 10:20 AM   Modules accepted: Orders

## 2022-12-01 ENCOUNTER — Encounter (INDEPENDENT_AMBULATORY_CARE_PROVIDER_SITE_OTHER): Payer: Self-pay | Admitting: Gastroenterology

## 2022-12-01 ENCOUNTER — Ambulatory Visit (INDEPENDENT_AMBULATORY_CARE_PROVIDER_SITE_OTHER): Payer: Medicaid Other | Admitting: Gastroenterology

## 2022-12-01 VITALS — Ht 68.0 in | Wt 178.0 lb

## 2022-12-01 DIAGNOSIS — K219 Gastro-esophageal reflux disease without esophagitis: Secondary | ICD-10-CM | POA: Insufficient documentation

## 2022-12-01 DIAGNOSIS — K2100 Gastro-esophageal reflux disease with esophagitis, without bleeding: Secondary | ICD-10-CM

## 2022-12-01 NOTE — Patient Instructions (Signed)
The patient and I held a thorough discussion about potential nonpharmacologic treatments for reflux such as transoral Incisionless Fundoplication (TIF).  The details of the procedure, benefits and risks, as well as prognosis with this intervention was thoroughly discussed with the patient who understood and agreed.  Dietary modifications and post procedural recommendations were also discussed the patient.  The patient will read more about this procedure.  Pamphlet provided. Patient will call to our office to schedule workup for TIF if interested in this, he will need to have an EGD with Bravo placement off PPI and esophagram performed as part of the workup.

## 2022-12-01 NOTE — Progress Notes (Unsigned)
Caleb Powell, M.D. Gastroenterology & Hepatology [facility] Gastroenterology [address] [place], [place] [address] Primary Care Physician: Medicine, [facility] No address on file  Referring MD: Hennie Duos. Marletta Lor, DO  This is a telephone virtual visit.  It required patient-provider interaction for the medical decision making as documented below. The patient has consented and agreed to proceed with a Telehealth encounter.  VIRTUAL VISIT NOTE Patient location: home Provider location: office  I will communicate my assessment and recommendations to the referring MD via EMR.  Chief Complaint: Heartburn  History of Present Illness: Caleb Powell is a 52 y.o. male with past medical history of anxiety, stroke on Plavix, peripheral artery disease, hypertension, GERD, who presents for presents for evaluation of GERD and candidacy for TIF.  Patient reports that for the last 3 years he has had issues with GERD, which he describes as heartburn, belching and burping. He also has frequent pressure in his chest.  Frequency of heartburn episodes: almost every day.  Onset of GERD symptoms: 3 years ago  Triggering foods: none that he has been able to identify  Nighttime episodes: Yes, having more symptoms at night.  Odynophagia: Occasional, but it does not preclude him from swallowing.  Dysphagia: No  The patient denies having any nausea, vomiting, fever, chills, hematochezia, melena, hematemesis, abdominal distention, abdominal pain, diarrhea, jaundice, pruritus or weight loss.  Previous imaging investigations:  - Esophagram: Never - Manometry: Not done - pH impedance testing: Not done  Previously used PPIs: Patient has tried Nexium, omeprazole, pantoprazole, Pepcid,dexlansoprazole, lansoprazole  during the last 3 years, but it caused elevation of blood pressure. States  pantoprazole led to better symptom improvement, but had some more  burping - states BP went up to 160s with it.  Last EGD: 01/14/2021  - LA Grade A reflux esophagitis with no bleeding. - Gastritis. Biopsied. - Duodenitis. Biopsied.  Smokes 1 pack of cigarettes a day. No alcohol or drugs.  Past Medical History: Past Medical History:  Diagnosis Date   Anxiety    Dyspnea    GERD (gastroesophageal reflux disease)    Hypertension    PAD (peripheral artery disease) (HCC)    Stroke (HCC)    eye stroke    Past Surgical History: Past Surgical History:  Procedure Laterality Date   BIOPSY  01/14/2021   Procedure: BIOPSY;  Surgeon: Lanelle Bal, DO;  [facility];  Service: Endoscopy;;   carotid stent Right 08/2020   COLONOSCOPY WITH PROPOFOL N/A 01/09/2022   Surgeon: Lanelle Bal, DO; multiple tubular adenomas removed. Recommended recall 3 years.   ESOPHAGOGASTRODUODENOSCOPY (EGD) WITH PROPOFOL N/A 01/14/2021   Surgeon: Lanelle Bal, DO;  LA grade a esophagitis, gastritis, duodenitis.  Biopsies negative for H. pylori.   FEMORAL ARTERY STENT Bilateral    ILIAC ARTERY STENT Right 09/10/2021   IR ANGIO INTRA EXTRACRAN SEL INTERNAL CAROTID BILAT MOD SED  08/30/2020   IR ANGIO VERTEBRAL SEL VERTEBRAL BILAT MOD SED  08/30/2020   IR INTRAVSC STENT CERV CAROTID W/EMB-PROT MOD SED INCL ANGIO  08/30/2020   IR US GUIDE VASC ACCESS RIGHT  08/30/2020   POLYPECTOMY  01/09/2022   Procedure: POLYPECTOMY;  Surgeon: Lanelle Bal, DO;  [facility];  Service: Endoscopy;;   RADIOLOGY WITH ANESTHESIA N/A 08/30/2020   Procedure: IR WITH ANESTHESIA;  Surgeon: Baldemar Lenis, MD;  Location: [facility] OR;  Service: Radiology;  Laterality: N/A;    Family History: Family  History  Problem Relation Age of Onset   Diabetes Mother    Heart disease Mother    Stroke Mother    Hypertension Maternal Grandmother    Hypertension Maternal Grandfather    Heart disease Father     Social History: Social History   Tobacco Use   Smoking Status Every Day   Current packs/day: 2.50   Average packs/day: 2.5 packs/day for 36.0 years (90.0 ttl pk-yrs)   Types: Cigarettes   Passive exposure: Current  Smokeless Tobacco Former   Social History   Substance and Sexual Activity  Alcohol Use Yes   Comment: rare   Social History   Substance and Sexual Activity  Drug Use No    Allergies: Allergies  Allergen Reactions   Amlodipine Swelling    Tingling of feet   Penicillins Other (See Comments)    Any "cillins"  Unknown allergy reaction. Childhood    Medications: Current Outpatient Medications  Medication Sig Dispense Refill   aspirin 81 MG chewable tablet Chew 1 tablet (81 mg total) by mouth daily. 30 tablet 0   CLONIDINE HCL PO Take by mouth as needed. Takes 0.2 mg along with 0.3 mg as needed tid     clopidogrel (PLAVIX) 75 MG tablet Take 75 mg by mouth daily.     Cyanocobalamin (B-12 PO) Take 1 tablet by mouth daily. Takes 3 days a week     LORazepam (ATIVAN) 0.5 MG tablet Take 0.25 mg by mouth in the morning.     olmesartan (BENICAR) 40 MG tablet Take 40 mg by mouth daily.     rosuvastatin (CRESTOR) 10 MG tablet Take 10 mg by mouth every evening.     Vitamin D-Vitamin K (VITAMIN K2-VITAMIN D3 PO) Take 1 tablet by mouth daily.     dexlansoprazole (DEXILANT) 60 MG capsule Take 1 capsule (60 mg total) by mouth daily. (Patient not taking: Reported on 11/26/2022) 30 capsule 5   No current facility-administered medications for this visit.    Review of Systems: GENERAL: negative for malaise, night sweats HEENT: No changes in hearing or vision, no nose bleeds or other nasal problems. NECK: Negative for lumps, goiter, pain and significant neck swelling RESPIRATORY: Negative for cough, wheezing CARDIOVASCULAR: Negative for chest pain, leg swelling, palpitations, orthopnea GI: SEE HPI MUSCULOSKELETAL: Negative for joint pain or swelling, back pain, and muscle pain. SKIN: Negative for lesions, rash PSYCH:  Negative for sleep disturbance, mood disorder and recent psychosocial stressors. HEMATOLOGY Negative for prolonged bleeding, bruising easily, and swollen nodes. ENDOCRINE: Negative for cold or heat intolerance, polyuria, polydipsia and goiter. NEURO: negative for tremor, gait imbalance, syncope and seizures. The remainder of the review of systems is noncontributory.   Physical Exam: No exam was performed as this was a telephone encounter  Imaging/Labs: as above  I personally reviewed and interpreted the available labs, imaging and endoscopic files.  Impression and Plan: Caleb Powell is a 52 y.o. male with past medical history of anxiety, stroke on Plavix, peripheral artery disease, hypertension, GERD, who presents for presents for evaluation of GERD and candidacy for TIF.  The patient and I held a thorough discussion about potential nonpharmacologic treatments for reflux such as transoral Incisionless Fundoplication (TIF).  The details of the procedure, benefits and risks, as well as prognosis with this intervention was thoroughly discussed with the patient who understood and agreed.  I consider that he would be a great candidate for possible TIF versus combo TIF, especially given that he has not been able  to tolerate PPIs in the long term as this has led to significant elevation of his blood pressure.  I informed the patient that if he were to proceed with TIF evaluation, we will need to repeat an EGD with Bravo placement off PPI and he should undergo a barium esophagram, as part of the preprocedural evaluation.  I also informed him that he will need to be on PPI for 4 weeks after the procedure is performed to avoid the risk of bleeding or ulceration.  He stated that he could try doing that for 4 weeks and adjust his blood pressure medications for that timeframe.  Ideally, the PPI that will be used will be pantoprazole as he tolerated this better.  We also discussed that as part of the protocol  to prevent intraprocedural or postprocedural bleeding, he will need to have his Plavix held for 5 days prior to the procedure and for 3 days after the procedure is performed.  Dietary modifications and post procedural recommendations were also discussed the patient.  The patient will read more about this procedure.  He states that he has to think if he wants to proceed with the testing and procedure.  All questions were answered.      Total visit time: I spent a total of  30  minutes  Caleb Blazing, MD Gastroenterology and Hepatology [facility] Gastroenterology

## 2022-12-05 LAB — LYME DISEASE ANTIBODY WITH REFLEX TO IMMUNOASSAY (IGG, IGM): LYME AB, SCREEN: <=0.9 {index} (ref <=0.90)

## 2022-12-07 LAB — ALPHA-GAL PANEL
Allergen, Mutton, f88: <0.1 kU/L
Allergen, Pork, f26: <0.1 kU/L
Beef: <0.1 kU/L
CLASS: 0
CLASS: 0
Class: 0
GALACTOSE-ALPHA-1,3-GALACTOSE IGE*: <0.1 kU/L (ref <0.10)

## 2022-12-07 LAB — INTERPRETATION:

## 2022-12-09 ENCOUNTER — Encounter: Payer: Self-pay | Admitting: Internal Medicine

## 2023-01-14 ENCOUNTER — Ambulatory Visit: Payer: Medicaid Other

## 2023-01-15 ENCOUNTER — Ambulatory Visit: Payer: Medicaid Other

## 2023-01-16 ENCOUNTER — Ambulatory Visit (INDEPENDENT_AMBULATORY_CARE_PROVIDER_SITE_OTHER): Payer: Medicaid Other

## 2023-01-16 ENCOUNTER — Ambulatory Visit
Admission: RE | Admit: 2023-01-16 | Discharge: 2023-01-16 | Disposition: A | Discharge status: Home / Self Care | Payer: Medicaid Other | Source: Ambulatory Visit | Attending: Family Medicine

## 2023-01-16 VITALS — BP 151/81 | HR 68 | Temp 97.7°F | Resp 18

## 2023-01-16 DIAGNOSIS — M25552 Pain in left hip: Secondary | ICD-10-CM | POA: Diagnosis not present

## 2023-01-16 NOTE — Discharge Instructions (Signed)
 We will call if anything comes back abnormal on your x-ray.  You may use heat, over-the-counter pain relievers, muscle rubs, lidocaine patches to help with your pain.

## 2023-01-16 NOTE — ED Provider Notes (Signed)
 [facility]    CSN: [id number] Arrival date & time: 01/16/23  0847      History   Chief Complaint Chief Complaint  Patient presents with   Hip Pain    i fell on my left hip and it is giving me problems and pain. i had an appointment for thursday (today) but there is no X-ray tech until tomorrow (Friday). - Entered by patient    HPI BREVEN GUIDROZ is a 53 y.o. male.   Patient presenting today with 2-day history of left lateral hip pain and bruising after falling onto the hip and arm.  He states the area is becoming increasingly more sore and stiff and a knot has come up in the area of impact.  Denies decreased range of motion, numbness or tingling, weakness, deformities, other areas of concern from the fall.  Did not hit his head or lose consciousness during the fall.  So far trying over-the-counter pain medications with minimal relief.    Past Medical History:  Diagnosis Date   Anxiety    Dyspnea    GERD (gastroesophageal reflux disease)    Hypertension    PAD (peripheral artery disease) (HCC)    Stroke (HCC)    eye stroke    Patient Active Problem List   Diagnosis Date Noted   GERD (gastroesophageal reflux disease) 12/01/2022   Internal carotid artery stenosis, right 08/30/2020   Stenosis of carotid artery 08/30/2020   Hypertensive crisis 08/29/2020   Leukocytosis 08/29/2020   Chest pain 08/29/2020   TIA (transient ischemic attack) 08/28/2020   Hyperlipidemia 02/24/2018   Tobacco abuse 02/24/2018   Mood disorder (HCC) 02/24/2018    Past Surgical History:  Procedure Laterality Date   BIOPSY  01/14/2021   Procedure: BIOPSY;  Surgeon: Cindie Carlin POUR, DO;  [facility];  Service: Endoscopy;;   carotid stent Right 08/2020   COLONOSCOPY WITH PROPOFOL  N/A 01/09/2022   Surgeon: Cindie Carlin POUR, DO; multiple tubular adenomas removed. Recommended recall 3 years.   ESOPHAGOGASTRODUODENOSCOPY (EGD) WITH PROPOFOL  N/A 01/14/2021   Surgeon: Cindie Carlin POUR, DO;  LA grade a esophagitis, gastritis, duodenitis.  Biopsies negative for H. pylori.   FEMORAL ARTERY STENT Bilateral    ILIAC ARTERY STENT Right 09/10/2021   IR ANGIO INTRA EXTRACRAN SEL INTERNAL CAROTID BILAT MOD SED  08/30/2020   IR ANGIO VERTEBRAL SEL VERTEBRAL BILAT MOD SED  08/30/2020   IR INTRAVSC STENT CERV CAROTID W/EMB-PROT MOD SED INCL ANGIO  08/30/2020   IR US  GUIDE VASC ACCESS RIGHT  08/30/2020   POLYPECTOMY  01/09/2022   Procedure: POLYPECTOMY;  Surgeon: Cindie Carlin POUR, DO;  [facility];  Service: Endoscopy;;   RADIOLOGY WITH ANESTHESIA N/A 08/30/2020   Procedure: IR WITH ANESTHESIA;  Surgeon: de Macedo Rodrigues, Katyucia, MD;  Location: [facility] OR;  Service: Radiology;  Laterality: N/A;       Home Medications    Prior to Admission medications   Medication Sig Start Date End Date Taking? Authorizing Provider  aspirin  81 MG chewable tablet Chew 1 tablet (81 mg total) by mouth daily. 09/01/20   Layman Raisin, DO  CLONIDINE HCL PO Take by mouth as needed. Takes 0.2 mg along with 0.3 mg as needed tid    [provider]  clopidogrel (PLAVIX) 75 MG tablet Take 75 mg by mouth daily.    [provider]  Cyanocobalamin (B-12 PO) Take 1 tablet by mouth daily. Takes 3 days a week    [provider]  dexlansoprazole  (DEXILANT ) 60 MG capsule Take 1 capsule (60 mg total) by mouth daily. Patient not taking: Reported on 11/26/2022 06/06/22   Rudy Josette RAMAN, PA-C  LORazepam  (ATIVAN ) 0.5 MG tablet Take 0.25 mg by mouth in the morning. 08/13/20   [provider]  olmesartan (BENICAR) 40 MG tablet Take 40 mg by mouth daily.    [provider]  rosuvastatin (CRESTOR) 10 MG tablet Take 10 mg by mouth every evening.    [provider]  Vitamin D-Vitamin K (VITAMIN K2-VITAMIN D3 PO) Take 1 tablet by mouth daily.    [provider]    Family History Family History  Problem Relation Age of Onset   Diabetes  Mother    Heart disease Mother    Stroke Mother    Hypertension Maternal Grandmother    Hypertension Maternal Grandfather    Heart disease Father     Social History Social History   Tobacco Use   Smoking status: Every Day    Current packs/day: 2.50    Average packs/day: 2.5 packs/day for 36.0 years (90.0 ttl pk-yrs)    Types: Cigarettes    Passive exposure: Current   Smokeless tobacco: Former  Building Services Engineer status: Former   Devices: used for about one month  Substance Use Topics   Alcohol use: Yes    Comment: rare   Drug use: No     Allergies   Amlodipine  and Penicillins   Review of Systems Review of Systems Per HPI  Physical Exam Triage Vital Signs ED Triage Vitals  Encounter Vitals Group     BP 01/16/23 0851 (!) 151/81     Systolic BP Percentile --      Diastolic BP Percentile --      Pulse Rate 01/16/23 0851 68     Resp 01/16/23 0851 18     Temp 01/16/23 0851 97.7 F (36.5 C)     Temp Source 01/16/23 0851 Oral     SpO2 01/16/23 0851 96 %     Weight --      Height --      Head Circumference --      Peak Flow --      Pain Score 01/16/23 0852 7     Pain Loc --      Pain Education --      Exclude from Growth Chart --    No data found.  Updated Vital Signs BP (!) 151/81 (BP Location: Right Arm)   Pulse 68   Temp 97.7 F (36.5 C) (Oral)   Resp 18   SpO2 96%   Visual Acuity Right Eye Distance:   Left Eye Distance:   Bilateral Distance:    Right Eye Near:   Left Eye Near:    Bilateral Near:     Physical Exam Vitals and nursing note reviewed.  Constitutional:      Appearance: Normal appearance.  HENT:     Head: Atraumatic.  Eyes:     Extraocular Movements: Extraocular movements intact.     Conjunctiva/sclera: Conjunctivae normal.  Cardiovascular:     Rate and Rhythm: Normal rate and regular rhythm.  Pulmonary:     Effort: Pulmonary effort is normal.     Breath sounds: Normal breath sounds.  Musculoskeletal:        General:  Tenderness and signs of injury present. No deformity. Normal range of motion.     Cervical back: Normal range of motion and neck supple.  Comments: Localized area of tenderness to palpation and contusion to the left lateral hip.  Range of motion intact, normal gait  Skin:    General: Skin is warm and dry.  Neurological:     General: No focal deficit present.     Mental Status: He is oriented to person, place, and time.     Comments: Bilateral lower extremities neurovascularly intact  Psychiatric:        Mood and Affect: Mood normal.        Thought Content: Thought content normal.        Judgment: Judgment normal.      UC Treatments / Results  Labs (all labs ordered are listed, but only abnormal results are displayed) Labs Reviewed - No data to display  EKG   Radiology DG Hip Unilat With Pelvis 2-3 Views Left Result Date: 01/16/2023 CLINICAL DATA:  Left hip pain. EXAM: DG HIP (WITH OR WITHOUT PELVIS) 2-3V LEFT COMPARISON:  None Available. FINDINGS: There is no acute fracture or dislocation. Moderate bilateral hip arthritic changes. The soft tissues are unremarkable. Right common iliac vascular stent. IMPRESSION: 1. No acute fracture or dislocation. 2. Moderate bilateral hip arthritic changes. Electronically Signed   By: Vanetta Chou M.D.   On: 01/16/2023 09:29    Procedures Procedures (including critical care time)  Medications Ordered in UC Medications - No data to display  Initial Impression / Assessment and Plan / UC Course  I have reviewed the triage vital signs and the nursing notes.  Pertinent labs & imaging results that were available during my care of the patient were reviewed by me and considered in my medical decision making (see chart for details).     X-ray of the left hip and pelvis negative for acute bony abnormality.  Suspect contusion from the fall.  Discussed over-the-counter pain medications, heat, Epsom salt soaks, rest.  He declines prescription  muscle relaxers or anti-inflammatories.  Return for worsening symptoms.  Final Clinical Impressions(s) / UC Diagnoses   Final diagnoses:  Left hip pain     Discharge Instructions      We will call if anything comes back abnormal on your x-ray.  You may use heat, over-the-counter pain relievers, muscle rubs, lidocaine  patches to help with your pain.    ED Prescriptions   None    PDMP not reviewed this encounter.   Stuart Vernell Norris, [place] 01/16/23 1055

## 2023-01-16 NOTE — ED Triage Notes (Signed)
 Pt reports he fell and injured his left hip x 3 days. States he has a knot on his left hip.

## 2023-01-23 NOTE — Progress Notes (Deleted)
 CARDIOLOGY CONSULT NOTE       Patient ID: Caleb Powell MRN: [id number] DOB/AGE: May 01, 1970 53 y.o.  Admit date: (Not on file) Referring Physician: Elder Negus NP [facility] Primary Physician: Medicine, [facility] Primary Cardiologist: New Reason for Consultation: HTN/PVD  Active Problems:   * No active hospital problems. *   HPI:  53 y.o. referred by NP at [facility] for HTN, PVD. History obtained from [facility]. HTN, HLD, smoker. History of iliac and carotid stents. No history of CAD/MI. Smokes 2 ppd since 1986 He takes Duloxetine and ativan for anxiety. Clonidine dose increased for BP 01/20/22 Also on Benicar. He takes crestor for HLD. Last LDL 01/21/23 at goal 69 ? Taking plavix for his PVD Lung cancer CT done 09/16/22 with no suspicious nodules. He's had some left sided facial numbness CT angio head/nec with patent RICA stent some intracranial dx and left vertebral dx, non contrast MRI head negative.    Right iliac stent done 09/10/21. ABI"s 05/19/22 normal on right and moderately reduced on left 0.66 Left iliac is known to be occluded and unable to be crossed at cath. Aorto fem bypass not considered due to ongoing smoking He sees Lindley Magnus MD at [facility] for his PVD  ***  ROS All other systems reviewed and negative except as noted above  Past Medical History:  Diagnosis Date   Anxiety    Dyspnea    GERD (gastroesophageal reflux disease)    Hypertension    PAD (peripheral artery disease) (HCC)    Stroke (HCC)    eye stroke    Family History  Problem Relation Age of Onset   Diabetes Mother    Heart disease Mother    Stroke Mother    Hypertension Maternal Grandmother    Hypertension Maternal Grandfather    Heart disease Father     Social History   Socioeconomic History   Marital status: Legally Separated    Spouse name: Not on file   Number of children: Not on file   Years of education: Not on file   Highest education level: Not on file   Occupational History   Not on file  Tobacco Use   Smoking status: Every Day    Current packs/day: 2.50    Average packs/day: 2.5 packs/day for 36.0 years (90.0 ttl pk-yrs)    Types: Cigarettes    Passive exposure: Current   Smokeless tobacco: Former  Building services engineer status: Former   Devices: used for about one month  Substance and Sexual Activity   Alcohol use: Yes    Comment: rare   Drug use: No   Sexual activity: Yes    Birth control/protection: None  Other Topics Concern   Not on file  Social History Narrative   ** Merged History Encounter **       Social Drivers of Health   Financial Resource Strain: Low Risk  (01/21/2023)   Received from [facility]   Overall Financial Resource Strain (CARDIA)    Difficulty of Paying Living Expenses: Not hard at all  Food Insecurity: No Food Insecurity (01/21/2023)   Received from [facility]   Hunger Vital Sign    Worried About Running Out of Food in the Last Year: Never true    Ran Out of Food in the Last Year: Never true  Transportation Needs: No Transportation Needs (01/21/2023)   Received from [facility] - Transportation    Lack of Transportation (Medical): No  Lack of Transportation (Non-Medical): No  Physical Activity: Unknown (01/21/2023)   Received from [facility]   Exercise Vital Sign    Days of Exercise per Week: 0 days    Minutes of Exercise per Session: Not on file  Stress: No Stress Concern Present (01/21/2023)   Received from [facility] - Occupational Stress Questionnaire    Feeling of Stress : Not at all  Social Connections: Socially Integrated (01/21/2023)   Received from [facility]   Social Network    How would you rate your social network (family, work, friends)?: Good participation with social networks  Intimate Partner Violence: Not At Risk (01/21/2023)   Received from [facility]   HITS    Over the last 12 months how often did  your partner physically hurt you?: Never    Over the last 12 months how often did your partner insult you or talk down to you?: Never    Over the last 12 months how often did your partner threaten you with physical harm?: Never    Over the last 12 months how often did your partner scream or curse at you?: Never    Past Surgical History:  Procedure Laterality Date   BIOPSY  01/14/2021   Procedure: BIOPSY;  Surgeon: Lanelle Bal, DO;  [facility];  Service: Endoscopy;;   carotid stent Right 08/2020   COLONOSCOPY WITH PROPOFOL N/A 01/09/2022   Surgeon: Lanelle Bal, DO; multiple tubular adenomas removed. Recommended recall 3 years.   ESOPHAGOGASTRODUODENOSCOPY (EGD) WITH PROPOFOL N/A 01/14/2021   Surgeon: Lanelle Bal, DO;  LA grade a esophagitis, gastritis, duodenitis.  Biopsies negative for H. pylori.   FEMORAL ARTERY STENT Bilateral    ILIAC ARTERY STENT Right 09/10/2021   IR ANGIO INTRA EXTRACRAN SEL INTERNAL CAROTID BILAT MOD SED  08/30/2020   IR ANGIO VERTEBRAL SEL VERTEBRAL BILAT MOD SED  08/30/2020   IR INTRAVSC STENT CERV CAROTID W/EMB-PROT MOD SED INCL ANGIO  08/30/2020   IR US GUIDE VASC ACCESS RIGHT  08/30/2020   POLYPECTOMY  01/09/2022   Procedure: POLYPECTOMY;  Surgeon: Lanelle Bal, DO;  [facility];  Service: Endoscopy;;   RADIOLOGY WITH ANESTHESIA N/A 08/30/2020   Procedure: IR WITH ANESTHESIA;  Surgeon: Baldemar Lenis, MD;  Location: [facility] OR;  Service: Radiology;  Laterality: N/A;      Current Outpatient Medications:    aspirin 81 MG chewable tablet, Chew 1 tablet (81 mg total) by mouth daily., Disp: 30 tablet, Rfl: 0   CLONIDINE HCL PO, Take by mouth as needed. Takes 0.2 mg along with 0.3 mg as needed tid, Disp: , Rfl:    clopidogrel (PLAVIX) 75 MG tablet, Take 75 mg by mouth daily., Disp: , Rfl:    Cyanocobalamin (B-12 PO), Take 1 tablet by mouth daily. Takes 3 days a week, Disp: , Rfl:    dexlansoprazole  (DEXILANT) 60 MG capsule, Take 1 capsule (60 mg total) by mouth daily. (Patient not taking: Reported on 11/26/2022), Disp: 30 capsule, Rfl: 5   LORazepam (ATIVAN) 0.5 MG tablet, Take 0.25 mg by mouth in the morning., Disp: , Rfl:    olmesartan (BENICAR) 40 MG tablet, Take 40 mg by mouth daily., Disp: , Rfl:    rosuvastatin (CRESTOR) 10 MG tablet, Take 10 mg by mouth every evening., Disp: , Rfl:    Vitamin D-Vitamin K (VITAMIN K2-VITAMIN D3 PO), Take 1 tablet by mouth daily., Disp: ,  Rfl:     Physical Exam: There were no vitals taken for this visit.    Chronically ill COPD Right carotid bruit No murmur Lungs *** Abdomen *** Decreased peripheral pulses on left with right femoral bruit ***  Labs:   Lab Results  Component Value Date   WBC 10.3 07/06/2021   HGB 15.5 07/06/2021   HCT 46.9 07/06/2021   MCV 91.4 07/06/2021   PLT 283 07/06/2021   No results for input(s): "NA", "K", "CL", "CO2", "BUN", "CREATININE", "CALCIUM", "PROT", "BILITOT", "ALKPHOS", "ALT", "AST", "GLUCOSE" in the last 168 hours.  Invalid input(s): "LABALBU" No results found for: "CKTOTAL", "CKMB", "CKMBINDEX", "TROPONINI"  Lab Results  Component Value Date   CHOL 177 08/29/2020   CHOL 239 (H) 06/13/2020   CHOL 251 (H) 03/02/2020   Lab Results  Component Value Date   HDL 26 (L) 08/29/2020   HDL 43 06/13/2020   HDL 36 (L) 03/02/2020   Lab Results  Component Value Date   LDLCALC 125 (H) 08/29/2020   LDLCALC 140 (H) 06/13/2020   LDLCALC 158 (H) 03/02/2020   Lab Results  Component Value Date   TRIG 131 08/29/2020   TRIG 280 (H) 06/13/2020   TRIG 284 (H) 03/02/2020   Lab Results  Component Value Date   CHOLHDL 6.8 08/29/2020   CHOLHDL 5.6 06/13/2020   CHOLHDL 7.0 03/02/2020   No results found for: "LDLDIRECT"    Radiology: DG Hip Unilat With Pelvis 2-3 Views Left Result Date: 01/16/2023 CLINICAL DATA:  Left hip pain. EXAM: DG HIP (WITH OR WITHOUT PELVIS) 2-3V LEFT COMPARISON:  None Available.  FINDINGS: There is no acute fracture or dislocation. Moderate bilateral hip arthritic changes. The soft tissues are unremarkable. Right common iliac vascular stent. IMPRESSION: 1. No acute fracture or dislocation. 2. Moderate bilateral hip arthritic changes. Electronically Signed   By: Elgie Collard M.D.   On: 01/16/2023 09:29    EKG: ***   ASSESSMENT AND PLAN:   HTN:  on ARB and ? PRN clonidine. Given PVD will order renal duplex to r/o RAS. *** HLD:  LDL at goal continue statin  PVD: right carotid stent and right iliac stent with residual left iliac occlusion and claudication. Needs to cut back smoking before being considered for aorto fem bypass F/U [facility] program ? Start Pletal CTA last year with patent right ICA stent Left ABI moderately reduced 0.66 CAD:  needs risk stratification for this given smoking and advanced PVD in both LE;s and carotids.  *** Smoking:  counseled on cessation Lung cancer CT negative this past year update per primary  Renal duplex RAS Cardiac CTA Lopressor *** BMET  F/U PRN pending studies He has a vascular doctor at [facility] and primary should be able To Rx HTN.  Will only need cardiology f/u if found to need further w/u for CAD  Signed: Charlton Haws 01/23/2023, 3:23 PM

## 2023-02-06 ENCOUNTER — Ambulatory Visit: Payer: Medicaid Other | Admitting: Cardiovascular Disease

## 2023-02-11 ENCOUNTER — Other Ambulatory Visit (HOSPITAL_COMMUNITY): Payer: Self-pay | Admitting: Neuroradiology

## 2023-02-11 DIAGNOSIS — I771 Stricture of artery: Secondary | ICD-10-CM

## 2023-02-27 ENCOUNTER — Ambulatory Visit: Payer: Medicaid Other

## 2023-02-28 ENCOUNTER — Ambulatory Visit (INDEPENDENT_AMBULATORY_CARE_PROVIDER_SITE_OTHER): Payer: Medicaid Other

## 2023-02-28 ENCOUNTER — Ambulatory Visit
Admission: RE | Admit: 2023-02-28 | Discharge: 2023-02-28 | Disposition: A | Discharge status: Home / Self Care | Payer: Medicaid Other | Source: Ambulatory Visit | Attending: Family Medicine

## 2023-02-28 VITALS — BP 128/72 | HR 58 | Temp 97.7°F | Resp 18

## 2023-02-28 DIAGNOSIS — R051 Acute cough: Secondary | ICD-10-CM

## 2023-02-28 DIAGNOSIS — J209 Acute bronchitis, unspecified: Secondary | ICD-10-CM | POA: Diagnosis not present

## 2023-02-28 MED ORDER — PREDNISONE 20 MG PO TABS: 40.0000 mg | ORAL_TABLET | Freq: Every day | ORAL | 0 refills | Status: DC

## 2023-02-28 MED ORDER — ALBUTEROL SULFATE HFA 108 (90 BASE) MCG/ACT IN AERS: 2.0000 | INHALATION_SPRAY | RESPIRATORY_TRACT | 0 refills | Status: DC | PRN

## 2023-02-28 MED ORDER — PROMETHAZINE-DM 6.25-15 MG/5ML PO SYRP: 5.0000 mL | ORAL_SOLUTION | Freq: Four times a day (QID) | ORAL | 0 refills | Status: DC | PRN

## 2023-02-28 MED ORDER — GUAIFENESIN ER 600 MG PO TB12: 600.0000 mg | ORAL_TABLET | Freq: Two times a day (BID) | ORAL | 0 refills | Status: DC | PRN

## 2023-02-28 NOTE — ED Provider Notes (Signed)
 [facility]    CSN: [id number] Arrival date & time: 02/28/23  0857      History   Chief Complaint Chief Complaint  Patient presents with   Cough    I got a bad cough that is now making me cough up bloody mucus. And my abdomen has had a lot of pain in it lately. - Entered by patient    HPI Caleb Powell is a 53 y.o. male.   Patient presenting today with ongoing cough for over a week with occasional wheezing, chest tightness.  Now having mild hemoptysis x 2 days.  Recently resolved from the flu about 2 weeks ago.  Denies fever, chest pain, severe shortness of breath, vomiting, diarrhea.  So far trying Coricidin HBP with minimal relief.  Chronic smoker, no known diagnosed chronic pulmonary disease.    Past Medical History:  Diagnosis Date   Anxiety    Dyspnea    GERD (gastroesophageal reflux disease)    Hypertension    PAD (peripheral artery disease) (HCC)    Stroke (HCC)    eye stroke    Patient Active Problem List   Diagnosis Date Noted   GERD (gastroesophageal reflux disease) 12/01/2022   Internal carotid artery stenosis, right 08/30/2020   Stenosis of carotid artery 08/30/2020   Hypertensive crisis 08/29/2020   Leukocytosis 08/29/2020   Chest pain 08/29/2020   TIA (transient ischemic attack) 08/28/2020   Hyperlipidemia 02/24/2018   Tobacco abuse 02/24/2018   Mood disorder (HCC) 02/24/2018    Past Surgical History:  Procedure Laterality Date   BIOPSY  01/14/2021   Procedure: BIOPSY;  Surgeon: Lanelle Bal, DO;  [facility];  Service: Endoscopy;;   carotid stent Right 08/2020   COLONOSCOPY WITH PROPOFOL N/A 01/09/2022   Surgeon: Lanelle Bal, DO; multiple tubular adenomas removed. Recommended recall 3 years.   ESOPHAGOGASTRODUODENOSCOPY (EGD) WITH PROPOFOL N/A 01/14/2021   Surgeon: Lanelle Bal, DO;  LA grade a esophagitis, gastritis, duodenitis.  Biopsies negative for H. pylori.   FEMORAL ARTERY STENT Bilateral    ILIAC  ARTERY STENT Right 09/10/2021   IR ANGIO INTRA EXTRACRAN SEL INTERNAL CAROTID BILAT MOD SED  08/30/2020   IR ANGIO VERTEBRAL SEL VERTEBRAL BILAT MOD SED  08/30/2020   IR INTRAVSC STENT CERV CAROTID W/EMB-PROT MOD SED INCL ANGIO  08/30/2020   IR US GUIDE VASC ACCESS RIGHT  08/30/2020   POLYPECTOMY  01/09/2022   Procedure: POLYPECTOMY;  Surgeon: Lanelle Bal, DO;  [facility];  Service: Endoscopy;;   RADIOLOGY WITH ANESTHESIA N/A 08/30/2020   Procedure: IR WITH ANESTHESIA;  Surgeon: Baldemar Lenis, MD;  Location: [facility] OR;  Service: Radiology;  Laterality: N/A;       Home Medications    Prior to Admission medications   Medication Sig Start Date End Date Taking? Authorizing Provider  albuterol (VENTOLIN HFA) 108 (90 Base) MCG/ACT inhaler Inhale 2 puffs into the lungs every 4 (four) hours as needed. 02/28/23  Yes Particia Nearing, PA-C  guaiFENesin (MUCINEX) 600 MG 12 hr tablet Take 1 tablet (600 mg total) by mouth 2 (two) times daily as needed. 02/28/23  Yes Particia Nearing, PA-C  predniSONE (DELTASONE) 20 MG tablet Take 2 tablets (40 mg total) by mouth daily with breakfast. 02/28/23  Yes Particia Nearing, PA-C  promethazine-dextromethorphan (PROMETHAZINE-DM) 6.25-15 MG/5ML syrup Take 5 mLs by mouth 4 (four) times daily as needed. 02/28/23  Yes Particia Nearing, PA-C  aspirin 81 MG  chewable tablet Chew 1 tablet (81 mg total) by mouth daily. 09/01/20   Briant Sites, DO  CLONIDINE HCL PO Take by mouth as needed. Takes 0.2 mg along with 0.3 mg as needed tid    [provider]  clopidogrel (PLAVIX) 75 MG tablet Take 75 mg by mouth daily.    [provider]  Cyanocobalamin (B-12 PO) Take 1 tablet by mouth daily. Takes 3 days a week    [provider]  dexlansoprazole (DEXILANT) 60 MG capsule Take 1 capsule (60 mg total) by mouth daily. Patient not taking: Reported on 11/26/2022 06/06/22   Letta Median, PA-C   LORazepam (ATIVAN) 0.5 MG tablet Take 0.25 mg by mouth in the morning. 08/13/20   [provider]  olmesartan (BENICAR) 40 MG tablet Take 40 mg by mouth daily.    [provider]  rosuvastatin (CRESTOR) 10 MG tablet Take 10 mg by mouth every evening.    [provider]  Vitamin D-Vitamin K (VITAMIN K2-VITAMIN D3 PO) Take 1 tablet by mouth daily.    [provider]    Family History Family History  Problem Relation Age of Onset   Diabetes Mother    Heart disease Mother    Stroke Mother    Hypertension Maternal Grandmother    Hypertension Maternal Grandfather    Heart disease Father     Social History Social History   Tobacco Use   Smoking status: Every Day    Current packs/day: 2.50    Average packs/day: 2.5 packs/day for 36.0 years (90.0 ttl pk-yrs)    Types: Cigarettes    Passive exposure: Current   Smokeless tobacco: Former  Building services engineer status: Former   Devices: used for about one month  Substance Use Topics   Alcohol use: Yes    Comment: rare   Drug use: No     Allergies   Amlodipine and Penicillins   Review of Systems Review of Systems Per HPI  Physical Exam Triage Vital Signs ED Triage Vitals  Encounter Vitals Group     BP 02/28/23 0914 128/72     Systolic BP Percentile --      Diastolic BP Percentile --      Pulse Rate 02/28/23 0914 (!) 58     Resp 02/28/23 0914 18     Temp 02/28/23 0914 97.7 F (36.5 C)     Temp Source 02/28/23 0914 Oral     SpO2 02/28/23 0914 95 %     Weight --      Height --      Head Circumference --      Peak Flow --      Pain Score 02/28/23 0915 0     Pain Loc --      Pain Education --      Exclude from Growth Chart --    No data found.  Updated Vital Signs BP 128/72 (BP Location: Right Arm)   Pulse (!) 58   Temp 97.7 F (36.5 C) (Oral)   Resp 18   SpO2 95%   Visual Acuity Right Eye Distance:   Left Eye Distance:   Bilateral Distance:    Right Eye Near:   Left  Eye Near:    Bilateral Near:     Physical Exam Vitals and nursing note reviewed.  Constitutional:      Appearance: He is well-developed.  HENT:     Head: Atraumatic.     Right Ear: External ear normal.  Left Ear: External ear normal.     Nose: Nose normal.     Mouth/Throat:     Mouth: Mucous membranes are moist.     Pharynx: Oropharynx is clear. No oropharyngeal exudate or posterior oropharyngeal erythema.  Eyes:     Conjunctiva/sclera: Conjunctivae normal.     Pupils: Pupils are equal, round, and reactive to light.  Cardiovascular:     Rate and Rhythm: Normal rate and regular rhythm.  Pulmonary:     Effort: Pulmonary effort is normal. No respiratory distress.     Breath sounds: No wheezing or rales.  Musculoskeletal:        General: Normal range of motion.     Cervical back: Normal range of motion and neck supple.  Lymphadenopathy:     Cervical: No cervical adenopathy.  Skin:    General: Skin is warm and dry.  Neurological:     Mental Status: He is alert and oriented to person, place, and time.  Psychiatric:        Behavior: Behavior normal.      UC Treatments / Results  Labs (all labs ordered are listed, but only abnormal results are displayed) Labs Reviewed - No data to display  EKG   Radiology DG Chest 2 View Result Date: 02/28/2023 CLINICAL DATA:  Cough and hemoptysis for 1 week. EXAM: CHEST - 2 VIEW COMPARISON:  07/06/2021 FINDINGS: The heart size and mediastinal contours are within normal limits. Both lungs are clear. Barium is noted within the transverse colon. IMPRESSION: No active cardiopulmonary disease. Electronically Signed   By: Danae Orleans M.D.   On: 02/28/2023 09:55    Procedures Procedures (including critical care time)  Medications Ordered in UC Medications - No data to display  Initial Impression / Assessment and Plan / UC Course  I have reviewed the triage vital signs and the nursing notes.  Pertinent labs & imaging results that were  available during my care of the patient were reviewed by me and considered in my medical decision making (see chart for details).     Vitals and exam reassuring today, oxygen saturation 95% on room air.  Chest x-ray negative for pneumonia or other acute abnormalities.  Suspect bronchitis post flu.  Treat with prednisone, Mucinex, Phenergan DM, albuterol inhaler as needed.  Follow-up with PCP for recheck.  Final Clinical Impressions(s) / UC Diagnoses   Final diagnoses:  Acute cough  Acute bronchitis, unspecified organism     Discharge Instructions      We will call if anything comes back abnormal on your chest x-ray.    ED Prescriptions     Medication Sig Dispense Auth. Provider   predniSONE (DELTASONE) 20 MG tablet Take 2 tablets (40 mg total) by mouth daily with breakfast. 10 tablet Particia Nearing, PA-C   promethazine-dextromethorphan (PROMETHAZINE-DM) 6.25-15 MG/5ML syrup Take 5 mLs by mouth 4 (four) times daily as needed. 100 mL Particia Nearing, PA-C   albuterol (VENTOLIN HFA) 108 (90 Base) MCG/ACT inhaler Inhale 2 puffs into the lungs every 4 (four) hours as needed. 18 g Particia Nearing, PA-C   guaiFENesin (MUCINEX) 600 MG 12 hr tablet Take 1 tablet (600 mg total) by mouth 2 (two) times daily as needed. 20 tablet Particia Nearing, [place]      PDMP not reviewed this encounter.   Particia Nearing, [place] 02/28/23 1011

## 2023-02-28 NOTE — Discharge Instructions (Signed)
 We will call if anything comes back abnormal on your chest x-ray.

## 2023-02-28 NOTE — ED Triage Notes (Signed)
 Pt reports he cannot stop coughing at night x 1 week. Coughing up blood x 2 days

## 2023-03-03 ENCOUNTER — Ambulatory Visit (HOSPITAL_COMMUNITY): Payer: Medicaid Other

## 2023-03-14 ENCOUNTER — Ambulatory Visit
Admission: RE | Admit: 2023-03-14 | Discharge: 2023-03-14 | Disposition: A | Discharge status: Home / Self Care | Source: Ambulatory Visit | Attending: Family Medicine

## 2023-03-14 VITALS — BP 116/69 | HR 70 | Temp 97.4°F | Resp 20

## 2023-03-14 DIAGNOSIS — M546 Pain in thoracic spine: Secondary | ICD-10-CM | POA: Diagnosis not present

## 2023-03-14 MED ORDER — CYCLOBENZAPRINE HCL 10 MG PO TABS: 10.0000 mg | ORAL_TABLET | Freq: Three times a day (TID) | ORAL | 0 refills | Status: DC | PRN

## 2023-03-14 MED ORDER — LIDOCAINE 5 % EX PTCH: 1.0000 | MEDICATED_PATCH | CUTANEOUS | 0 refills | Status: DC

## 2023-03-14 NOTE — ED Triage Notes (Signed)
 Pt reports he has pain between his shoulder blades x 3 days. States he also has a cough

## 2023-03-15 NOTE — ED Provider Notes (Signed)
 [facility]    CSN: [id number] Arrival date & time: 03/14/23  0857      History   Chief Complaint Chief Complaint  Patient presents with   Shoulder Pain    Need an X-ray between my shoulder blades in the back, it's constant pain. And may have strep throat. - Entered by patient    HPI Caleb Powell is a 53 y.o. male.   Pt reports he has pain between his shoulder blades x 3 days. States he also has a cough       Past Medical History:  Diagnosis Date   Anxiety    Dyspnea    GERD (gastroesophageal reflux disease)    Hypertension    PAD (peripheral artery disease) (HCC)    Stroke (HCC)    eye stroke    Patient Active Problem List   Diagnosis Date Noted   GERD (gastroesophageal reflux disease) 12/01/2022   Internal carotid artery stenosis, right 08/30/2020   Stenosis of carotid artery 08/30/2020   Hypertensive crisis 08/29/2020   Leukocytosis 08/29/2020   Chest pain 08/29/2020   TIA (transient ischemic attack) 08/28/2020   Hyperlipidemia 02/24/2018   Tobacco abuse 02/24/2018   Mood disorder (HCC) 02/24/2018    Past Surgical History:  Procedure Laterality Date   BIOPSY  01/14/2021   Procedure: BIOPSY;  Surgeon: Lanelle Bal, DO;  [facility];  Service: Endoscopy;;   carotid stent Right 08/2020   COLONOSCOPY WITH PROPOFOL N/A 01/09/2022   Surgeon: Lanelle Bal, DO; multiple tubular adenomas removed. Recommended recall 3 years.   ESOPHAGOGASTRODUODENOSCOPY (EGD) WITH PROPOFOL N/A 01/14/2021   Surgeon: Lanelle Bal, DO;  LA grade a esophagitis, gastritis, duodenitis.  Biopsies negative for H. pylori.   FEMORAL ARTERY STENT Bilateral    ILIAC ARTERY STENT Right 09/10/2021   IR ANGIO INTRA EXTRACRAN SEL INTERNAL CAROTID BILAT MOD SED  08/30/2020   IR ANGIO VERTEBRAL SEL VERTEBRAL BILAT MOD SED  08/30/2020   IR INTRAVSC STENT CERV CAROTID W/EMB-PROT MOD SED INCL ANGIO  08/30/2020   IR US GUIDE VASC ACCESS RIGHT  08/30/2020    POLYPECTOMY  01/09/2022   Procedure: POLYPECTOMY;  Surgeon: Lanelle Bal, DO;  [facility];  Service: Endoscopy;;   RADIOLOGY WITH ANESTHESIA N/A 08/30/2020   Procedure: IR WITH ANESTHESIA;  Surgeon: Baldemar Lenis, MD;  Location: [facility] OR;  Service: Radiology;  Laterality: N/A;       Home Medications    Prior to Admission medications   Medication Sig Start Date End Date Taking? Authorizing Provider  cyclobenzaprine (FLEXERIL) 10 MG tablet Take 1 tablet (10 mg total) by mouth 3 (three) times daily as needed for muscle spasms. May cause drowsiness 03/14/23  Yes Particia Nearing, PA-C  lidocaine (LIDODERM) 5 % Place 1 patch onto the skin daily. Remove & Discard patch within 12 hours or as directed by MD 03/14/23  Yes Particia Nearing, PA-C  albuterol (VENTOLIN HFA) 108 (90 Base) MCG/ACT inhaler Inhale 2 puffs into the lungs every 4 (four) hours as needed. 02/28/23   Particia Nearing, PA-C  aspirin 81 MG chewable tablet Chew 1 tablet (81 mg total) by mouth daily. 09/01/20   Briant Sites, DO  CLONIDINE HCL PO Take by mouth as needed. Takes 0.2 mg along with 0.3 mg as needed tid    [provider]  clopidogrel (PLAVIX) 75 MG tablet Take 75 mg by mouth daily.    [provider]  Cyanocobalamin (B-12 PO) Take 1 tablet by mouth daily. Takes 3 days a week    [provider]  dexlansoprazole (DEXILANT) 60 MG capsule Take 1 capsule (60 mg total) by mouth daily. Patient not taking: Reported on 11/26/2022 06/06/22   Letta Median, PA-C  guaiFENesin (MUCINEX) 600 MG 12 hr tablet Take 1 tablet (600 mg total) by mouth 2 (two) times daily as needed. 02/28/23   Particia Nearing, PA-C  LORazepam (ATIVAN) 0.5 MG tablet Take 0.25 mg by mouth in the morning. 08/13/20   [provider]  olmesartan (BENICAR) 40 MG tablet Take 40 mg by mouth daily.    [provider]  predniSONE (DELTASONE) 20 MG tablet Take 2 tablets  (40 mg total) by mouth daily with breakfast. 02/28/23   Particia Nearing, PA-C  promethazine-dextromethorphan (PROMETHAZINE-DM) 6.25-15 MG/5ML syrup Take 5 mLs by mouth 4 (four) times daily as needed. 02/28/23   Particia Nearing, PA-C  rosuvastatin (CRESTOR) 10 MG tablet Take 10 mg by mouth every evening.    [provider]  Vitamin D-Vitamin K (VITAMIN K2-VITAMIN D3 PO) Take 1 tablet by mouth daily.    [provider]    Family History Family History  Problem Relation Age of Onset   Diabetes Mother    Heart disease Mother    Stroke Mother    Hypertension Maternal Grandmother    Hypertension Maternal Grandfather    Heart disease Father     Social History Social History   Tobacco Use   Smoking status: Every Day    Current packs/day: 2.50    Average packs/day: 2.5 packs/day for 36.0 years (90.0 ttl pk-yrs)    Types: Cigarettes    Passive exposure: Current   Smokeless tobacco: Former  Building services engineer status: Former   Devices: used for about one month  Substance Use Topics   Alcohol use: Yes    Comment: rare   Drug use: No     Allergies   Amlodipine and Penicillins   Review of Systems Review of Systems PER HPI  Physical Exam Triage Vital Signs ED Triage Vitals [03/14/23 0921]  Encounter Vitals Group     BP 116/69     Systolic BP Percentile      Diastolic BP Percentile      Pulse Rate 70     Resp 20     Temp (!) 97.4 F (36.3 C)     Temp src      SpO2 96 %     Weight      Height      Head Circumference      Peak Flow      Pain Score 9     Pain Loc      Pain Education      Exclude from Growth Chart    No data found.  Updated Vital Signs BP 116/69   Pulse 70   Temp (!) 97.4 F (36.3 C)   Resp 20   SpO2 96%   Visual Acuity Right Eye Distance:   Left Eye Distance:   Bilateral Distance:    Right Eye Near:   Left Eye Near:    Bilateral Near:     Physical Exam Vitals and nursing note reviewed.  Constitutional:       Appearance: Normal appearance.  HENT:     Head: Atraumatic.  Eyes:     Extraocular Movements: Extraocular movements intact.     Conjunctiva/sclera: Conjunctivae normal.  Cardiovascular:  Rate and Rhythm: Normal rate and regular rhythm.  Pulmonary:     Effort: Pulmonary effort is normal.     Breath sounds: Normal breath sounds.  Musculoskeletal:        General: Tenderness present. No swelling, deformity or signs of injury. Normal range of motion.     Cervical back: Normal range of motion and neck supple.     Comments: Point tenderness to palpation to the left thoracic paraspinal musculature, no bony forming palpable to the thoracic spine, range of motion of all 4 extremities intact.  Grip strength full and equal bilateral hands.  Skin:    General: Skin is warm and dry.     Findings: No bruising or erythema.  Neurological:     General: No focal deficit present.     Mental Status: He is oriented to person, place, and time.  Psychiatric:        Mood and Affect: Mood normal.        Thought Content: Thought content normal.        Judgment: Judgment normal.      UC Treatments / Results  Labs (all labs ordered are listed, but only abnormal results are displayed) Labs Reviewed - No data to display  EKG   Radiology No results found.  Procedures Procedures (including critical care time)  Medications Ordered in UC Medications - No data to display  Initial Impression / Assessment and Plan / UC Course  I have reviewed the triage vital signs and the nursing notes.  Pertinent labs & imaging results that were available during my care of the patient were reviewed by me and considered in my medical decision making (see chart for details).     Symptoms and exam findings consistent with thoracic paraspinal strain.  X-ray imaging deferred with shared decision making.  Treat with flexeril, lidocaine patches, heat, massage, stretches, rest.  PCP follow-up recommended for  recheck.  Final Clinical Impressions(s) / UC Diagnoses   Final diagnoses:  Acute midline thoracic back pain   Discharge Instructions   None    ED Prescriptions     Medication Sig Dispense Auth. Provider   cyclobenzaprine (FLEXERIL) 10 MG tablet Take 1 tablet (10 mg total) by mouth 3 (three) times daily as needed for muscle spasms. May cause drowsiness 15 tablet Particia Nearing, PA-C   lidocaine (LIDODERM) 5 % Place 1 patch onto the skin daily. Remove & Discard patch within 12 hours or as directed by MD 30 patch Particia Nearing, PA-C      PDMP not reviewed this encounter.   Particia Nearing, [place] 03/15/23 1239

## 2023-03-18 ENCOUNTER — Ambulatory Visit (HOSPITAL_COMMUNITY)
Admission: RE | Admit: 2023-03-18 | Discharge: 2023-03-18 | Disposition: A | Discharge status: Home / Self Care | Payer: Medicaid Other | Source: Ambulatory Visit | Attending: Neuroradiology | Admitting: Neuroradiology

## 2023-03-18 DIAGNOSIS — I771 Stricture of artery: Secondary | ICD-10-CM

## 2023-04-03 NOTE — Progress Notes (Signed)
 CARDIOLOGY CONSULT NOTE       Patient ID: Caleb Powell MRN: [id number] DOB/AGE: Oct 25, 1970 53 y.o.  Admit date: (Not on file) Referring Physician: Elder Negus [facility] Primary Physician: Medicine, [facility] Primary Cardiologist: New Reason for Consultation: CAD/PVD/HTN    HPI:  53 y.o. referred by CAD risk with history of HTN, smoking and PVD. He has significant arthritis. Was seen in ER 03/14/23 for back pain between shoulder blades CXR with NAD and normal mediastinum. Rx with flexeril and lidoderm patch for thoracic paraspinal strain. Had swelling with norvasc in regard to his HTN. Statin d/c due to leg pain that is probably more neuropathy and he was started on gabapentin.  PVD with prior right ICA stent. Duplex 03/18/23 widely patent with no left sided dx  Had right eye symptoms with negative CT/MRI Has been on ASA and plavix  Prior right femoral stent and ? Total SFA on left with collaterals No resting claudication  TTE done 08/09/20 ? Right eye TIA showed normal EF 55-60% AV sclerosis and no signs of elevated PA pressures.   He smokes 2 PPPD works as a Horticulturist, commercial Has current family with 55 yo 55 yo twins and 56 yo daughter with him today and older kids via another relationship. Willing to try Chantix again. Multiple somatic complaints ? Muscle wasting in LE:s with neuropathy ? Fbiromyalgia  Hie indicates having abnormal myovue 2 years ago and cath with non obstructive dx Has seen Dr Saundra Shelling at [facility] for [facility]ology.   BP currently being rx with clonidine and benicar  He seems to want answers to why he feels so bad generally. I explained to him it is because he smokes more than 2 ppd   ROS All other systems reviewed and negative except as noted above  Past Medical History:  Diagnosis Date   Anxiety    Dyspnea    GERD (gastroesophageal reflux disease)    Hypertension    PAD (peripheral artery disease) (HCC)    Stroke (HCC)    eye stroke    Family  History  Problem Relation Age of Onset   Diabetes Mother    Heart disease Mother    Stroke Mother    Hypertension Maternal Grandmother    Hypertension Maternal Grandfather    Heart disease Father     Social History   Socioeconomic History   Marital status: Legally Separated    Spouse name: Not on file   Number of children: Not on file   Years of education: Not on file   Highest education level: Not on file  Occupational History   Not on file  Tobacco Use   Smoking status: Every Day    Current packs/day: 2.50    Average packs/day: 2.5 packs/day for 36.0 years (90.0 ttl pk-yrs)    Types: Cigarettes    Passive exposure: Current   Smokeless tobacco: Former  Building services engineer status: Former   Devices: used for about one month  Substance and Sexual Activity   Alcohol use: Yes    Comment: rare   Drug use: No   Sexual activity: Yes    Birth control/protection: None  Other Topics Concern   Not on file  Social History Narrative   ** Merged History Encounter **       Social Drivers of Health   Financial Resource Strain: Low Risk  (01/21/2023)   Received from [facility]   Overall Financial Resource Strain (CARDIA)  Difficulty of Paying Living Expenses: Not hard at all  Food Insecurity: No Food Insecurity (01/21/2023)   Received from [facility]   Hunger Vital Sign    Worried About Running Out of Food in the Last Year: Never true    Ran Out of Food in the Last Year: Never true  Transportation Needs: No Transportation Needs (01/21/2023)   Received from [facility] - Transportation    Lack of Transportation (Medical): No    Lack of Transportation (Non-Medical): No  Physical Activity: Unknown (01/21/2023)   Received from [facility]   Exercise Vital Sign    Days of Exercise per Week: 0 days    Minutes of Exercise per Session: Not on file  Stress: No Stress Concern Present (01/21/2023)   Received from [facility] - Occupational Stress Questionnaire    Feeling of Stress : Not at all  Social Connections: Socially Integrated (01/21/2023)   Received from [facility]   Social Network    How would you rate your social network (family, work, friends)?: Good participation with social networks  Intimate Partner Violence: Not At Risk (01/21/2023)   Received from [facility]   HITS    Over the last 12 months how often did your partner physically hurt you?: Never    Over the last 12 months how often did your partner insult you or talk down to you?: Never    Over the last 12 months how often did your partner threaten you with physical harm?: Never    Over the last 12 months how often did your partner scream or curse at you?: Never    Past Surgical History:  Procedure Laterality Date   BIOPSY  01/14/2021   Procedure: BIOPSY;  Surgeon: Lanelle Bal, DO;  [facility];  Service: Endoscopy;;   carotid stent Right 08/2020   COLONOSCOPY WITH PROPOFOL N/A 01/09/2022   Surgeon: Lanelle Bal, DO; multiple tubular adenomas removed. Recommended recall 3 years.   ESOPHAGOGASTRODUODENOSCOPY (EGD) WITH PROPOFOL N/A 01/14/2021   Surgeon: Lanelle Bal, DO;  LA grade a esophagitis, gastritis, duodenitis.  Biopsies negative for H. pylori.   FEMORAL ARTERY STENT Bilateral    ILIAC ARTERY STENT Right 09/10/2021   IR ANGIO INTRA EXTRACRAN SEL INTERNAL CAROTID BILAT MOD SED  08/30/2020   IR ANGIO VERTEBRAL SEL VERTEBRAL BILAT MOD SED  08/30/2020   IR INTRAVSC STENT CERV CAROTID W/EMB-PROT MOD SED INCL ANGIO  08/30/2020   IR US GUIDE VASC ACCESS RIGHT  08/30/2020   POLYPECTOMY  01/09/2022   Procedure: POLYPECTOMY;  Surgeon: Lanelle Bal, DO;  [facility];  Service: Endoscopy;;   RADIOLOGY WITH ANESTHESIA N/A 08/30/2020   Procedure: IR WITH ANESTHESIA;  Surgeon: Baldemar Lenis, MD;  Location: [facility] OR;  Service: Radiology;  Laterality: N/A;      Current  Outpatient Medications:    aspirin 81 MG chewable tablet, Chew 1 tablet (81 mg total) by mouth daily., Disp: 30 tablet, Rfl: 0   cloNIDine (CATAPRES) 0.2 MG tablet, Take 0.2 mg by mouth 3 (three) times daily. Unless SBP is elevated over 160 takes 0.3, Disp: , Rfl:    cloNIDine (CATAPRES) 0.3 MG tablet, Take 0.3 mg by mouth as needed (elevated SBP over 160)., Disp: , Rfl:    clopidogrel (PLAVIX) 75 MG tablet, Take 75 mg by mouth daily., Disp: , Rfl:    Cyanocobalamin (B-12 PO), Take 1 tablet  by mouth daily. Takes 3 days a week, Disp: , Rfl:    DULoxetine (CYMBALTA) 20 MG capsule, Take 1 capsule (20 mg total) by mouth daily., Disp: 90 capsule, Rfl: 3   LORazepam (ATIVAN) 0.5 MG tablet, Take 0.25 mg by mouth in the morning., Disp: , Rfl:    olmesartan (BENICAR) 40 MG tablet, Take 40 mg by mouth daily., Disp: , Rfl:    rosuvastatin (CRESTOR) 10 MG tablet, Take 10 mg by mouth every evening., Disp: , Rfl:    Vitamin D-Vitamin K (VITAMIN K2-VITAMIN D3 PO), Take 1 tablet by mouth daily., Disp: , Rfl:    albuterol (VENTOLIN HFA) 108 (90 Base) MCG/ACT inhaler, Inhale 2 puffs into the lungs every 4 (four) hours as needed. (Patient not taking: Reported on 04/13/2023), Disp: 18 g, Rfl: 0   cyclobenzaprine (FLEXERIL) 10 MG tablet, Take 1 tablet (10 mg total) by mouth 3 (three) times daily as needed for muscle spasms. May cause drowsiness (Patient not taking: Reported on 04/13/2023), Disp: 15 tablet, Rfl: 0   dexlansoprazole (DEXILANT) 60 MG capsule, Take 1 capsule (60 mg total) by mouth daily. (Patient not taking: Reported on 04/13/2023), Disp: 30 capsule, Rfl: 5   guaiFENesin (MUCINEX) 600 MG 12 hr tablet, Take 1 tablet (600 mg total) by mouth 2 (two) times daily as needed. (Patient not taking: Reported on 04/13/2023), Disp: 20 tablet, Rfl: 0   lidocaine (LIDODERM) 5 %, Place 1 patch onto the skin daily. Remove & Discard patch within 12 hours or as directed by MD (Patient not taking: Reported on 04/13/2023), Disp: 30 patch,  Rfl: 0   predniSONE (DELTASONE) 20 MG tablet, Take 2 tablets (40 mg total) by mouth daily with breakfast. (Patient not taking: Reported on 04/13/2023), Disp: 10 tablet, Rfl: 0   promethazine-dextromethorphan (PROMETHAZINE-DM) 6.25-15 MG/5ML syrup, Take 5 mLs by mouth 4 (four) times daily as needed. (Patient not taking: Reported on 04/13/2023), Disp: 100 mL, Rfl: 0    Physical Exam: Blood pressure (!) 142/80, pulse (!) 55, height 5\' 8"  (1.727 m), weight 166 lb (75.3 kg), SpO2 96%.    Bronchitic male Right Bruit with prior ICA stent Emphysema no active wheezing AV sclerosis murmur  Abdomen benign Peripheral pulses decreased left pedal pulses  No edema  Labs:   Lab Results  Component Value Date   WBC 10.3 07/06/2021   HGB 15.5 07/06/2021   HCT 46.9 07/06/2021   MCV 91.4 07/06/2021   PLT 283 07/06/2021   No results for input(s): "NA", "K", "CL", "CO2", "BUN", "CREATININE", "CALCIUM", "PROT", "BILITOT", "ALKPHOS", "ALT", "AST", "GLUCOSE" in the last 168 hours.  Invalid input(s): "LABALBU" No results found for: "CKTOTAL", "CKMB", "CKMBINDEX", "TROPONINI"  Lab Results  Component Value Date   CHOL 177 08/29/2020   CHOL 239 (H) 06/13/2020   CHOL 251 (H) 03/02/2020   Lab Results  Component Value Date   HDL 26 (L) 08/29/2020   HDL 43 06/13/2020   HDL 36 (L) 03/02/2020   Lab Results  Component Value Date   LDLCALC 125 (H) 08/29/2020   LDLCALC 140 (H) 06/13/2020   LDLCALC 158 (H) 03/02/2020   Lab Results  Component Value Date   TRIG 131 08/29/2020   TRIG 280 (H) 06/13/2020   TRIG 284 (H) 03/02/2020   Lab Results  Component Value Date   CHOLHDL 6.8 08/29/2020   CHOLHDL 5.6 06/13/2020   CHOLHDL 7.0 03/02/2020   No results found for: "LDLDIRECT"    Radiology: VAS US CAROTID Result Date: 03/18/2023 Carotid Arterial  Duplex Study Patient Name:  Caleb Powell  Date of Exam:   03/18/2023 Medical Rec #: [id number]     Accession #:    [id number] Date of Birth: Sep 01, 1970      Patient  Gender: M Patient Age:   64 years Exam Location:  [facility] Procedure:      VAS US CAROTID Referring Phys: Jerilynn Mages DE MACEDO RODRIGUES --------------------------------------------------------------------------------  Indications:       Atherosclerosis of the carotid artery. and right ICA stent. Risk Factors:      Hypertension, hyperlipidemia. Comparison Study:  Precious study on 2.1.2024. Performing Technologist: Fernande Bras  Examination Guidelines: A complete evaluation includes B-mode imaging, spectral Doppler, color Doppler, and power Doppler as needed of all accessible portions of each vessel. Bilateral testing is considered an integral part of a complete examination. Limited examinations for reoccurring indications may be performed as noted.  Right Carotid Findings: +----------+--------+--------+--------+------------------+--------+           PSV cm/sEDV cm/sStenosisPlaque DescriptionComments +----------+--------+--------+--------+------------------+--------+ CCA Prox  86      26                                         +----------+--------+--------+--------+------------------+--------+ CCA Distal67      21                                         +----------+--------+--------+--------+------------------+--------+ ICA Prox  54      20                                         +----------+--------+--------+--------+------------------+--------+ ICA Mid                                             Stent.   +----------+--------+--------+--------+------------------+--------+ ICA Distal92      27                                         +----------+--------+--------+--------+------------------+--------+ ECA       307     45      >50%                               +----------+--------+--------+--------+------------------+--------+ +----------+--------+-------+--------+-------------------+           PSV cm/sEDV cmsDescribeArm Pressure (mmHG)  +----------+--------+-------+--------+-------------------+ [id number]                                        +----------+--------+-------+--------+-------------------+ +---------+--------+--+--------+--+ VertebralPSV cm/s89EDV cm/s31 +---------+--------+--+--------+--+  Right Stent(s): +---------------+---+--++++ Prox to Stent  59 23 +---------------+---+--++++ Proximal Stent 78 27 +---------------+---+--++++ Mid Stent      83 29 +---------------+---+--++++ Distal Stent   11543 +---------------+---+--++++ Distal to [id number] +---------------+---+--++++   Left Carotid Findings: +----------+--------+--------+--------+------------------+--------+           PSV cm/sEDV cm/sStenosisPlaque DescriptionComments +----------+--------+--------+--------+------------------+--------+ CCA Prox  111  25                                         +----------+--------+--------+--------+------------------+--------+ CCA Distal105     25                                         +----------+--------+--------+--------+------------------+--------+ ICA Prox  114     29                                         +----------+--------+--------+--------+------------------+--------+ ICA Mid   88      30                                         +----------+--------+--------+--------+------------------+--------+ ICA Distal68      25                                         +----------+--------+--------+--------+------------------+--------+ ECA       144     21                                         +----------+--------+--------+--------+------------------+--------+ +----------+--------+--------+--------+-------------------+           PSV cm/sEDV cm/sDescribeArm Pressure (mmHG) +----------+--------+--------+--------+-------------------+ Subclavian172                                          +----------+--------+--------+--------+-------------------+ +---------+--------+--+--------+-+ VertebralPSV cm/s17EDV cm/s6 +---------+--------+--+--------+-+   Summary: Right Carotid: The ECA appears >50% stenosed. Right ICA stent patent with <50%                stenosis. Left Carotid: Velocities in the left ICA are consistent with a 1-39% stenosis. Vertebrals:  Bilateral vertebral arteries demonstrate antegrade flow. Subclavians: Normal flow hemodynamics were seen in bilateral subclavian              arteries. *See table(s) above for measurements and observations.  Electronically signed by Coral Else MD on 03/18/2023 at 8:43:44 PM.    Final     EKG: SR rate 55 normal   ASSESSMENT AND PLAN:   HTN:  on clonidine and ARB. Swelling with norvasc. HR runs low given smoking and PVD check renal duplex r/o RAS CAD Risk:  smoking and PVD indicates cath at [facility] 2 years ago with no obstructive dx  PVD:  prior right ICA stent widely patent by duplex 03/18/23 continue DAT Stent right femoral artery with occluded left SFA no rest claudication F/U vascular [facility]  Neuropathy:  on gabapentin   HLD:  Has been on statin in past Currently taking Zetia per primary target LDL < 70 LDL 01/2023 was 69 GI:  reflux contiknue dexilant  Smoking:  CXR 03/2023 NAD consider lung cancer screening CT next year per primary Fibromyalgia: ? Neuropathy no improvement off crestor will resume Called in  Cymbalta for patient can f/u with primary   Cymbalta  F/U PRN   Signed: Charlton Haws 04/13/2023, 9:42 AM

## 2023-04-13 ENCOUNTER — Encounter: Payer: Self-pay | Admitting: Cardiovascular Disease

## 2023-04-13 ENCOUNTER — Ambulatory Visit: Payer: Medicaid Other | Attending: Cardiovascular Disease | Admitting: Cardiovascular Disease

## 2023-04-13 VITALS — BP 142/80 | HR 55 | Ht 68.0 in | Wt 166.0 lb

## 2023-04-13 DIAGNOSIS — Z720 Tobacco use: Secondary | ICD-10-CM | POA: Diagnosis not present

## 2023-04-13 DIAGNOSIS — R079 Chest pain, unspecified: Secondary | ICD-10-CM

## 2023-04-13 DIAGNOSIS — K2100 Gastro-esophageal reflux disease with esophagitis, without bleeding: Secondary | ICD-10-CM

## 2023-04-13 DIAGNOSIS — I169 Hypertensive crisis, unspecified: Secondary | ICD-10-CM

## 2023-04-13 DIAGNOSIS — I6521 Occlusion and stenosis of right carotid artery: Secondary | ICD-10-CM | POA: Diagnosis not present

## 2023-04-13 DIAGNOSIS — E782 Mixed hyperlipidemia: Secondary | ICD-10-CM

## 2023-04-13 MED ORDER — DULOXETINE HCL 20 MG PO CPEP: 20.0000 mg | ORAL_CAPSULE | Freq: Every day | ORAL | 3 refills | Status: AC

## 2023-04-13 NOTE — Patient Instructions (Addendum)
 Medication Instructions:  Your physician has recommended you make the following change in your medication:  1- Tale Cymbalta 20 mg by mouth daily.   *If you need a refill on your cardiac medications before your next appointment, please call your pharmacy*  Lab Work: If you have labs (blood work) drawn today and your tests are completely normal, you will receive your results only by: MyChart Message (if you have MyChart) OR A paper copy in the mail If you have any lab test that is abnormal or we need to change your treatment, we will call you to review the results.  Testing/Procedures: None ordered today.  Follow-Up: At [facility], you and your health needs are our priority.  As part of our continuing mission to provide you with exceptional heart care, our providers are all part of one team.  This team includes your primary Cardiologist (physician) and Advanced Practice Providers or APPs (Physician Assistants and Nurse Practitioners) who all work together to provide you with the care you need, when you need it.  Your next appointment:   As needed  Provider:   Charlton Haws, MD     We recommend signing up for the patient portal called "MyChart".  Sign up information is provided on this After Visit Summary.  MyChart is used to connect with patients for Virtual Visits (Telemedicine).  Patients are able to view lab/test results, encounter notes, upcoming appointments, etc.  Non-urgent messages can be sent to your provider as well.   To learn more about what you can do with MyChart, go to [email or web address].   Other Instructions       1st Floor: - Lobby - Registration  - Pharmacy  - Lab - Cafe  2nd Floor: - PV Lab - Diagnostic Testing (echo, CT, nuclear med)  3rd Floor: - Vacant  4th Floor: - TCTS (cardiothoracic surgery) - AFib Clinic - Structural Heart Clinic - Vascular Surgery  - Vascular Ultrasound  5th Floor: - HeartCare Cardiology (general and  EP) - Clinical Pharmacy for coumadin, hypertension, lipid, weight-loss medications, and med management appointments    Valet parking services will be available as well.

## 2023-07-03 ENCOUNTER — Encounter (HOSPITAL_COMMUNITY): Payer: Self-pay | Admitting: Interventional Radiology

## 2023-09-10 ENCOUNTER — Ambulatory Visit: Payer: Self-pay

## 2023-09-16 ENCOUNTER — Ambulatory Visit
Admission: RE | Admit: 2023-09-16 | Discharge: 2023-09-16 | Disposition: A | Discharge status: Home / Self Care | Payer: Self-pay | Attending: Family Medicine | Admitting: Family Medicine

## 2023-09-16 VITALS — BP 157/89 | HR 78 | Temp 98.6°F | Resp 20

## 2023-09-16 DIAGNOSIS — J0100 Acute maxillary sinusitis, unspecified: Secondary | ICD-10-CM | POA: Diagnosis not present

## 2023-09-16 MED ORDER — PREDNISONE 50 MG PO TABS: ORAL_TABLET | ORAL | 0 refills | Status: DC

## 2023-09-16 MED ORDER — SULFAMETHOXAZOLE-TRIMETHOPRIM 800-160 MG PO TABS
1.0000 | ORAL_TABLET | Freq: Two times a day (BID) | ORAL | 0 refills | Status: AC
Start: 2023-09-16 — End: 2023-09-26

## 2023-09-16 NOTE — ED Triage Notes (Signed)
 Pt report cough congestion had a cold about 10 days ago but cannot get ride of the cough or congestion.

## 2023-09-16 NOTE — ED Provider Notes (Signed)
 [facility]   [id number] 09/16/23 Arrival Time: 1201  ASSESSMENT & PLAN:  1. Acute non-recurrent maxillary sinusitis    Begin: Meds ordered this encounter  Medications   predniSONE  (DELTASONE ) 50 MG tablet    Sig: Take one tablet by mouth for 5 days.    Dispense:  5 tablet    Refill:  0   sulfamethoxazole -trimethoprim  (BACTRIM  DS) 800-160 MG tablet    Sig: Take 1 tablet by mouth 2 (two) times daily for 10 days.    Dispense:  20 tablet    Refill:  0    Discussed typical duration of symptoms. OTC symptom care as needed. Ensure adequate fluid intake and rest.   Follow-up Information     Schedule an appointment as soon as possible for a visit  with Medicine, [facility].   Specialty: Family Medicine Why: For follow up.                Reviewed expectations re: course of current medical issues. Questions answered. Outlined signs and symptoms indicating need for more acute intervention. Patient verbalized understanding. After Visit Summary given.   SUBJECTIVE: History from: patient.  Caleb Powell is a 53 y.o. male who presents with complaint of nasal congestion, post-nasal drainage, and sinus pain. Onset gradual, 10 d ago. Respiratory symptoms: denies. Fever: denies. Overall normal PO intake without n/v. Now with maxillary facial pressure.  Social History   Tobacco Use  Smoking Status Every Day   Current packs/day: 2.50   Average packs/day: 2.5 packs/day for 36.0 years (90.0 ttl pk-yrs)   Types: Cigarettes   Passive exposure: Current  Smokeless Tobacco Former    OBJECTIVE:  Vitals:   09/16/23 1207  BP: (!) 157/89  Pulse: 78  Resp: 20  Temp: 98.6 F (37 C)  TempSrc: Oral  SpO2: 95%     General appearance: alert; no distress HEENT: nasal congestion; clear runny nose; throat irritation secondary to post-nasal drainage; bilateral maxillary tenderness to palpation; turbinates boggy Neck: supple without LAD; trachea  midline Lungs: unlabored respirations, symmetrical air entry; cough: absent; no respiratory distress Skin: warm and dry Psychological: alert and cooperative; normal mood and affect  Allergies  Allergen Reactions   Amlodipine  Swelling    Tingling of feet   Penicillins Other (See Comments)    Any cillins  Unknown allergy reaction. Childhood    Past Medical History:  Diagnosis Date   Anxiety    Dyspnea    GERD (gastroesophageal reflux disease)    Hypertension    PAD (peripheral artery disease) (HCC)    Stroke (HCC)    eye stroke   Family History  Problem Relation Age of Onset   Diabetes Mother    Heart disease Mother    Stroke Mother    Hypertension Maternal Grandmother    Hypertension Maternal Grandfather    Heart disease Father    Social History   Socioeconomic History   Marital status: Legally Separated    Spouse name: Not on file   Number of children: Not on file   Years of education: Not on file   Highest education level: Not on file  Occupational History   Not on file  Tobacco Use   Smoking status: Every Day    Current packs/day: 2.50    Average packs/day: 2.5 packs/day for 36.0 years (90.0 ttl pk-yrs)    Types: Cigarettes    Passive exposure: Current   Smokeless tobacco: Former  Building services engineer status: Former  Devices: used for about one month  Substance and Sexual Activity   Alcohol use: Yes    Comment: rare   Drug use: No   Sexual activity: Yes    Birth control/protection: None  Other Topics Concern   Not on file  Social History Narrative   ** Merged History Encounter **       Social Drivers of Health   Financial Resource Strain: Low Risk  (01/21/2023)   Received from [facility]   Overall Financial Resource Strain (CARDIA)    Difficulty of Paying Living Expenses: Not hard at all  Food Insecurity: No Food Insecurity (01/21/2023)   Received from [facility]   Hunger Vital Sign    Within the past 12 months, you worried that your  food would run out before you got the money to buy more.: Never true    Within the past 12 months, the food you bought just didn't last and you didn't have money to get more.: Never true  Transportation Needs: No Transportation Needs (01/21/2023)   Received from [facility] - Transportation    Lack of Transportation (Medical): No    Lack of Transportation (Non-Medical): No  Physical Activity: Unknown (01/21/2023)   Received from [facility]   Exercise Vital Sign    On average, how many days per week do you engage in moderate to strenuous exercise (like a brisk walk)?: 0 days    Minutes of Exercise per Session: Not on file  Stress: No Stress Concern Present (01/21/2023)   Received from [facility] of Occupational Health - Occupational Stress Questionnaire    Feeling of Stress : Not at all  Social Connections: Socially Integrated (01/21/2023)   Received from [facility]   Social Network    How would you rate your social network (family, work, friends)?: Good participation with social networks  Intimate Partner Violence: Not At Risk (01/21/2023)   Received from [facility]   HITS    Over the last 12 months how often did your partner physically hurt you?: Never    Over the last 12 months how often did your partner insult you or talk down to you?: Never    Over the last 12 months how often did your partner threaten you with physical harm?: Never    Over the last 12 months how often did your partner scream or curse at you?: Never             Rolinda Rogue, MD 09/16/23 1336

## 2023-10-21 ENCOUNTER — Encounter (INDEPENDENT_AMBULATORY_CARE_PROVIDER_SITE_OTHER): Payer: Self-pay | Admitting: Gastroenterology

## 2023-12-16 ENCOUNTER — Telehealth: Payer: Self-pay | Admitting: Cardiovascular Disease

## 2023-12-16 MED ORDER — HYDRALAZINE HCL 50 MG PO TABS: 50.0000 mg | ORAL_TABLET | Freq: Three times a day (TID) | ORAL | 3 refills | Status: DC

## 2023-12-16 NOTE — Telephone Encounter (Signed)
 Pt c/o medication issue:  1. Name of Medication:   olmesartan (BENICAR) 40 MG tablet    cloNIDine (CATAPRES) 0.2 MG tablet    2. How are you currently taking this medication (dosage and times per day)? As prescribed   3. Are you having a reaction (difficulty breathing--STAT)? No   4. What is your medication issue? Pt has been having high bp even when taking BP medication. Please advise.    STAT if BP is GREATER than 180/120 TODAY.  STAT if BP is LESS than 90/60 and SYMPTOMATIC TODAY  1. What is your BP concern? Hypertension   2. Have you taken any BP medication today?Yes   3. What are your last 5 BP readings?162/83 this morning   4. Are you having any other symptoms (ex. Dizziness, headache, blurred vision, passed out)? Headache, pressure/numbness in face

## 2023-12-16 NOTE — Progress Notes (Unsigned)
 CARDIOLOGY CONSULT NOTE       Patient ID: Caleb Powell MRN: [id number] DOB/AGE: [birth year] 53 y.o.  Admit date: (Not on file) Referring Physician: Efrain Broaden [facility] Primary Physician: Medicine, [facility] (Inactive) Primary Cardiologist: Delford Reason for Consultation: CAD/PVD/HTN    HPI:  53 y.o. referred by CAD risk with history of HTN, smoking and PVD. First seen by me 55/7/25  He has significant arthritis. Was seen in ER 03/14/23 for back pain between shoulder blades CXR with NAD and normal mediastinum. Rx with flexeril  and lidoderm  patch for thoracic paraspinal strain. Had swelling with norvasc  in regard to his HTN. Statin d/c due to leg pain that is probably more neuropathy and he was started on gabapentin.  PVD with prior right ICA stent. Duplex 03/18/23 widely patent with no left sided dx  Had right eye symptoms with negative CT/MRI Has been on ASA and plavix  Prior right femoral stent and ? Total SFA on left with collaterals No resting claudication  TTE done 08/09/20 ? Right eye TIA showed normal EF 55-60% AV sclerosis and no signs of elevated PA pressures.  Carotid 03/18/23 with patent right stent and 1-39% LICA stensosis  He smokes 2 PPPD works as a horticulturist, commercial Has current family with 55 yo 55 yo twins and 66 yo daughter with him today and older kids via another relationship. Willing to try Chantix again. Multiple somatic complaints ? Muscle wasting in LE:s with neuropathy ? Fbiromyalgia  Hie indicates having abnormal myovue 2 years ago and cath with non obstructive dx Has seen Dr Valentine at [facility] for cardiology.   BP currently being rx with clonidine and benicar Seen by primary ? BP has been elevated  Patient states BP this 12/10 was 162/83. It's not unusual for his SBP to spike into the 200's, but stays mostly in the 160-170 range. He takes clonidine 0.3 mg as needed for SBP >160.    Patient denies any chest pain. He states he has headache and facial  pressure/numbness, but is also dealing with sinus issues.***  He seems to want answers to why he feels so bad generally. I explained to him it is because he smokes more than 2 ppd     ROS All other systems reviewed and negative except as noted above  Past Medical History:  Diagnosis Date   Anxiety    Dyspnea    GERD (gastroesophageal reflux disease)    Hypertension    PAD (peripheral artery disease)    Stroke (HCC)    eye stroke    Family History  Problem Relation Age of Onset   Diabetes Mother    Heart disease Mother    Stroke Mother    Hypertension Maternal Grandmother    Hypertension Maternal Grandfather    Heart disease Father     Social History   Socioeconomic History   Marital status: Legally Separated    Spouse name: Not on file   Number of children: Not on file   Years of education: Not on file   Highest education level: Not on file  Occupational History   Not on file  Tobacco Use   Smoking status: Every Day    Current packs/day: 2.50    Average packs/day: 2.5 packs/day for 36.0 years (90.0 ttl pk-yrs)    Types: Cigarettes    Passive exposure: Current   Smokeless tobacco: Former  Building Services Engineer status: Former   Devices: used for about one month  Substance and Sexual Activity   Alcohol use: Yes    Comment: rare   Drug use: No   Sexual activity: Yes    Birth control/protection: None  Other Topics Concern   Not on file  Social History Narrative   ** Merged History Encounter **       Social Drivers of Health   Financial Resource Strain: Low Risk (01/21/2023)   Received from [facility]   Overall Financial Resource Strain (CARDIA)    Difficulty of Paying Living Expenses: Not hard at all  Food Insecurity: No Food Insecurity (01/21/2023)   Received [facility]   Hunger Vital Sign    Within the past 12 months, you worried that your food would run out before you got the money to buy more.: Never true     Within the past 12 months, the food you bought just didn't last and you didn't have money to get more.: Never true  Transportation Needs: No Transportation Needs (01/21/2023)   Received from [facility] - Transportation    Lack of Transportation (Medical): No    Lack of Transportation (Non-Medical): No  Physical Activity: Unknown (01/21/2023)   Received from [facility]   Exercise Vital Sign    On average, how many days per week do you engage in moderate to strenuous exercise (like a brisk walk)?: 0 days    Minutes of Exercise per Session: Not on file  Stress: No Stress Concern Present (01/21/2023)   Received from [facility] of Occupational Health - Occupational Stress Questionnaire    Feeling of Stress : Not at all  Social Connections: Socially Integrated (01/21/2023)   Received from [facility]   Social Network    How would you rate your social network (family, work, friends)?: Good participation with social networks  Intimate Partner Violence: Not At Risk (01/21/2023)   Received from [facility]   HITS    Over the last 12 months how often did your partner physically hurt you?: Never    Over the last 12 months how often did your partner insult you or talk down to you?: Never    Over the last 12 months how often did your partner threaten you with physical harm?: Never    Over the last 12 months how often did your partner scream or curse at you?: Never    Past Surgical History:  Procedure Laterality Date   BIOPSY  01/14/2021   Procedure: BIOPSY;  Surgeon: Cindie Carlin POUR, DO;  Location: [facility];  Service: Endoscopy;;   carotid stent Right 08/2020   COLONOSCOPY WITH PROPOFOL  N/A 01/09/2022   Surgeon: Cindie Carlin POUR, DO; multiple tubular adenomas removed. Recommended recall 3 years.   ESOPHAGOGASTRODUODENOSCOPY (EGD) WITH PROPOFOL  N/A 01/14/2021   Surgeon: Cindie Carlin POUR, DO;  LA grade a esophagitis, gastritis,  duodenitis.  Biopsies negative for H. pylori.   FEMORAL ARTERY STENT Bilateral    ILIAC ARTERY STENT Right 09/10/2021   IR ANGIO INTRA EXTRACRAN SEL INTERNAL CAROTID BILAT MOD SED  08/30/2020   IR ANGIO VERTEBRAL SEL VERTEBRAL BILAT MOD SED  08/30/2020   IR INTRAVSC STENT CERV CAROTID W/EMB-PROT MOD SED INCL ANGIO  08/30/2020   IR US  GUIDE VASC ACCESS RIGHT  08/30/2020   POLYPECTOMY  01/09/2022   Procedure: POLYPECTOMY;  Surgeon: Cindie Carlin POUR, DO;  Location: [facility];  Service: Endoscopy;;   RADIOLOGY WITH ANESTHESIA N/A 08/30/2020   Procedure: IR WITH ANESTHESIA;  Surgeon: de Macedo Rodrigues, Katyucia, MD;  Location: [facility] OR;  Service: Radiology;  Laterality: N/A;      Current Outpatient Medications:    aspirin  81 MG chewable tablet, Chew 1 tablet (81 mg total) by mouth daily., Disp: 30 tablet, Rfl: 0   cloNIDine (CATAPRES) 0.2 MG tablet, Take 0.2 mg by mouth 3 (three) times daily. Unless SBP is elevated over 160 takes 0.3, Disp: , Rfl:    cloNIDine (CATAPRES) 0.3 MG tablet, Take 0.3 mg by mouth as needed (elevated SBP over 160)., Disp: , Rfl:    clopidogrel (PLAVIX) 75 MG tablet, Take 75 mg by mouth daily., Disp: , Rfl:    Cyanocobalamin (B-12 PO), Take 1 tablet by mouth daily. Takes 3 days a week, Disp: , Rfl:    DULoxetine  (CYMBALTA ) 20 MG capsule, Take 1 capsule (20 mg total) by mouth daily., Disp: 90 capsule, Rfl: 3   LORazepam  (ATIVAN ) 0.5 MG tablet, Take 0.25 mg by mouth in the morning., Disp: , Rfl:    olmesartan (BENICAR) 40 MG tablet, Take 40 mg by mouth daily., Disp: , Rfl:    predniSONE  (DELTASONE ) 50 MG tablet, Take one tablet by mouth for 5 days., Disp: 5 tablet, Rfl: 0   rosuvastatin (CRESTOR) 10 MG tablet, Take 10 mg by mouth every evening., Disp: , Rfl:    Vitamin D-Vitamin K (VITAMIN K2-VITAMIN D3 PO), Take 1 tablet by mouth daily., Disp: , Rfl:     Physical Exam: There were no vitals taken for this visit.    Bronchitic male Right Bruit  with prior ICA stent Emphysema no active wheezing AV sclerosis murmur  Abdomen benign Peripheral pulses decreased left pedal pulses  No edema  Labs:   Lab Results  Component Value Date   WBC 10.3 07/06/2021   HGB 15.5 07/06/2021   HCT 46.9 07/06/2021   MCV 91.4 07/06/2021   PLT 283 07/06/2021   No results for input(s): NA, K, CL, CO2, BUN, CREATININE, CALCIUM , PROT, BILITOT, ALKPHOS, ALT, AST, GLUCOSE in the last 168 hours.  Invalid input(s): LABALBU No results found for: CKTOTAL, CKMB, CKMBINDEX, TROPONINI  Lab Results  Component Value Date   CHOL 177 08/29/2020   CHOL 239 (H) 06/13/2020   CHOL 251 (H) 03/02/2020   Lab Results  Component Value Date   HDL 26 (L) 08/29/2020   HDL 43 06/13/2020   HDL 36 (L) 03/02/2020   Lab Results  Component Value Date   LDLCALC 125 (H) 08/29/2020   LDLCALC 140 (H) 06/13/2020   LDLCALC 158 (H) 03/02/2020   Lab Results  Component Value Date   TRIG 131 08/29/2020   TRIG 280 (H) 06/13/2020   TRIG 284 (H) 03/02/2020   Lab Results  Component Value Date   CHOLHDL 6.8 08/29/2020   CHOLHDL 5.6 06/13/2020   CHOLHDL 7.0 03/02/2020   No results found for: LDLDIRECT    Radiology: No results found.   EKG: SR rate 55 normal   ASSESSMENT AND PLAN:   HTN:  on clonidine and ARB. Swelling with norvasc . HR runs low given smoking and PVD check renal duplex r/o RAS Add hydralazine  50 mg tid F/U in HTN clinic If still high consider adding diuretic  *** CAD Risk:  smoking and PVD indicates cath at [facility] 2 years ago with no obstructive dx  PVD:  prior right ICA stent widely patent by duplex 03/18/23 continue DAT Stent right femoral artery with occluded left SFA no rest claudication F/U vascular [facility]  Neuropathy:  on  gabapentin   HLD:  Has been on statin in past Currently taking Zetia per primary target LDL < 70 LDL 01/2023 was 69 GI:  reflux contiknue dexilant   Smoking:  CXR 03/2023 NAD consider lung  cancer screening CT next year per primary Fibromyalgia: ? Neuropathy no improvement off crestor will resume Called in Cymbalta  for patient can f/u with primary   Renal Duplex for RAS Hydralazine  50 mg TID F/U HTN clinic    F/U in a year   Signed: Maude Emmer 12/16/2023, 11:33 AM

## 2023-12-16 NOTE — Telephone Encounter (Signed)
 Delford Maude BROCKS, MD to [facility] Triage  Trudy Frankey SAUNDERS, RN    12/16/23  1:08 PM Can start hydralazine  50 mg tid   Called patient with Dr. Claiborne advisement. Sent in medication to patient's pharmacy of choice. Patient will start to see if it is working for him before his appointment on Friday.

## 2023-12-16 NOTE — Telephone Encounter (Signed)
 Patient states his blood pressure has been elevated and hard to manage for a while now. He was working with his PCP on this who prescribed the olmesartan and clonidine for him. PCP recommends following up with cardiology, patient would like to see Dr. Delford again.  Patient states BP this morning was 162/83. It's not unusual for his SBP to spike into the 200's, but stays mostly in the 160-170 range. He takes clonidine 0.3 mg as needed for SBP >160.   Patient denies any chest pain. He states he has headache and facial pressure/numbness, but is also dealing with sinus issues. Reviewed signs/symptoms of stroke and advised on ED precautions, patient verbalized understanding.  Scheduled appt for patient to see Dr. Delford 12/18/23 at 3:00 PM.  Will forward to Dr. Delford and his nurse to review prior to appt this week.

## 2023-12-18 ENCOUNTER — Ambulatory Visit: Attending: Cardiovascular Disease | Admitting: Cardiovascular Disease

## 2023-12-18 ENCOUNTER — Encounter: Payer: Self-pay | Admitting: Cardiovascular Disease

## 2023-12-18 VITALS — BP 160/68 | HR 73 | Ht 68.0 in | Wt 181.0 lb

## 2023-12-18 DIAGNOSIS — I701 Atherosclerosis of renal artery: Secondary | ICD-10-CM

## 2023-12-18 DIAGNOSIS — Z720 Tobacco use: Secondary | ICD-10-CM | POA: Diagnosis not present

## 2023-12-18 DIAGNOSIS — I6521 Occlusion and stenosis of right carotid artery: Secondary | ICD-10-CM

## 2023-12-18 DIAGNOSIS — E782 Mixed hyperlipidemia: Secondary | ICD-10-CM

## 2023-12-18 DIAGNOSIS — I169 Hypertensive crisis, unspecified: Secondary | ICD-10-CM | POA: Diagnosis not present

## 2023-12-18 NOTE — Patient Instructions (Signed)
 Medication Instructions:  Your physician recommends that you continue on your current medications as directed. Please refer to the Current Medication list given to you today.  *If you need a refill on your cardiac medications before your next appointment, please call your pharmacy*   Testing/Procedures: Your physician has requested that you have a renal artery duplex. During this test, an ultrasound is used to evaluate blood flow to the kidneys. Allow one hour for this exam. Do not eat after midnight the day before and avoid carbonated beverages. Take your medications as you usually do.   Follow-Up: At [facility], you and your health needs are our priority.  As part of our continuing mission to provide you with exceptional heart care, our providers are all part of one team.  This team includes your primary Cardiologist (physician) and Advanced Practice Providers or APPs (Physician Assistants and Nurse Practitioners) who all work together to provide you with the care you need, when you need it.  Your next appointment:   4-6 week(s) HTN Clinic  Provider:   Annabella Scarce, MD or Reche Finder, NP

## 2024-01-04 ENCOUNTER — Ambulatory Visit: Payer: Self-pay | Admitting: Cardiovascular Disease

## 2024-01-04 ENCOUNTER — Ambulatory Visit (HOSPITAL_COMMUNITY)
Admission: RE | Admit: 2024-01-04 | Discharge: 2024-01-04 | Disposition: A | Discharge status: Home / Self Care | Source: Ambulatory Visit | Attending: Cardiovascular Disease | Admitting: Cardiovascular Disease

## 2024-01-04 DIAGNOSIS — I701 Atherosclerosis of renal artery: Secondary | ICD-10-CM | POA: Diagnosis not present

## 2024-01-20 NOTE — Progress Notes (Signed)
 CARDIOLOGY CONSULT NOTE       Patient ID: Caleb Powell MRN: [id number] DOB/AGE: 56-Apr-1972 56 y.o.  Admit date: (Not on file) Referring Physician: Efrain Broaden [facility] Primary Physician: Medicine, [facility] (Inactive) Primary Cardiologist: Delford Reason for Consultation: CAD/PVD/HTN    HPI:  54 y.o. referred by CAD risk with history of HTN, smoking and PVD. First seen by me 04/13/23  He has significant arthritis. Was seen in ER 03/14/23 for back pain between shoulder blades CXR with NAD and normal mediastinum. Rx with flexeril  and lidoderm  patch for thoracic paraspinal strain. Had swelling with norvasc  in regard to his HTN. Statin d/c due to leg pain that is probably more neuropathy and he was started on gabapentin.  PVD with prior right ICA stent. Duplex 03/18/23 widely patent with no left sided dx  Had right eye symptoms with negative CT/MRI Has been on ASA and plavix  Prior right femoral stent and ? Total SFA on left with collaterals No resting claudication  TTE done 08/09/20 ? Right eye TIA showed normal EF 55-60% AV sclerosis and no signs of elevated PA pressures.  Carotid 03/18/23 with patent right stent and 1-39% LICA stensosis  He smokes 2 PPPD works as a horticulturist, commercial Has current family with 56 yo 56 yo twins and 75 yo daughter with him today and older kids via another relationship. Willing to try Chantix again. Multiple somatic complaints ? Muscle wasting in LE:s with neuropathy ? Fbiromyalgia  Hie indicates having abnormal myovue 2 years ago and cath with non obstructive dx Has seen Dr Valentine at [facility] for cardiology.   BP currently being rx with lbenicar 40 mg and catapres.  Hydralazine  d/c made him feel anxious  Patient denies any chest pain. He states he has headache and facial pressure/numbness, but is also dealing with sinus issues.   He seems to want answers to why he feels so bad generally. I explained to him it is because he smokes more than 2 ppd    He was admitted to [facility] 12/28/23 for chest pain R/O and did not stay for any testing. Hydralazine  d/c due to making him feel anxious D/c with labetalol 100 mg bid, olmesartan 40 mg daaily with PRN clonidine 0.2 mg PRN   He has cervical type neuropathic pain both arms/back. Has carpal tunnel symptoms on left. Constipation but scared to take metamucil. Anxiety is horrible Started on Lexapro by primary this week    ROS All other systems reviewed and negative except as noted above  Past Medical History:  Diagnosis Date   Anxiety    Dyspnea    GERD (gastroesophageal reflux disease)    Hypertension    PAD (peripheral artery disease)    Stroke (HCC)    eye stroke    Family History  Problem Relation Age of Onset   Diabetes Mother    Heart disease Mother    Stroke Mother    Hypertension Maternal Grandmother    Hypertension Maternal Grandfather    Heart disease Father     Social History   Socioeconomic History   Marital status: Legally Separated    Spouse name: Not on file   Number of children: Not on file   Years of education: Not on file   Highest education level: Not on file  Occupational History   Not on file  Tobacco Use   Smoking status: Every Day    Current packs/day: 2.50    Average packs/day: 2.5 packs/day  for 36.0 years (90.0 ttl pk-yrs)    Types: Cigarettes    Passive exposure: Current   Smokeless tobacco: Former  Building Services Engineer status: Former   Devices: used for about one month  Substance and Sexual Activity   Alcohol use: Yes    Comment: rare   Drug use: No   Sexual activity: Yes    Birth control/protection: None  Other Topics Concern   Not on file  Social History Narrative   ** Merged History Encounter **       Social Drivers of Health   Tobacco Use: High Risk (01/27/2024)   Received from [facility]   Patient History    Smoking Tobacco Use: Every Day    Smokeless Tobacco Use: Never    Passive Exposure: Current  Financial  Resource Strain: Low Risk (01/27/2024)   Received from [facility]   Overall Financial Resource Strain (CARDIA)    How hard is it for you to pay for the very basics like food, housing, medical care, and heating?: Not hard at all  Food Insecurity: No Food Insecurity (01/27/2024)   Received from [facility]   Epic    Within the past 12 months, you worried that your food would run out before you got the money to buy more.: Never true    Within the past 12 months, the food you bought just didn't last and you didn't have money to get more.: Never true  Transportation Needs: No Transportation Needs (01/27/2024)   Received from [facility]    In the past 12 months, has lack of transportation kept you from medical appointments or from getting medications?: No    In the past 12 months, has lack of transportation kept you from meetings, work, or from getting things needed for daily living?: No  Physical Activity: Insufficiently Active (12/28/2023)   Received from [facility]   Exercise Vital Sign    On average, how many days per week do you engage in moderate to strenuous exercise (like a brisk walk)?: 2 days    On average, how many minutes do you engage in exercise at this level?: 20 min  Stress: No Stress Concern Present (12/28/2023)   Received from [facility] Occupational [facility] - Occupational Stress Questionnaire    Do you feel stress - tense, restless, nervous, or anxious, or unable to sleep at night because your mind is troubled all the time - these days?: Not at all  Social Connections: Moderately Isolated (12/28/2023)   Received from [facility]   Social Connection and Isolation Panel    In a typical week, how many times do you talk on the phone with family, friends, or neighbors?: More than three times a week    How often do you get together with friends or relatives?: More than three times a week    How often do you attend church or religious  services?: Never    Do you belong to any clubs or organizations such as church groups, unions, fraternal or athletic groups, or school groups?: No    How often do you attend meetings of the clubs or organizations you belong to?: Never    Are you married, widowed, divorced, separated, never married, or living with a partner?: Living with partner  Intimate Partner Violence: Unknown (12/28/2023)   Received from [facility]   Epic    Within the last year, have you been  afraid of your partner or ex-partner?: No    Within the last year, have you been humiliated or emotionally abused in other ways by your partner or ex-partner?: No    Within the last year, have you been kicked, hit, slapped, or otherwise physically hurt by your partner or ex-partner?: No    Sexually Abused: Not on file  Depression (EYV7-0): Not on file  Alcohol Screen: Not on file  Housing: Low Risk (01/27/2024)   Received from [facility]    In the last 12 months, was there a time when you were not able to pay the mortgage or rent on time?: No    In the past 12 months, how many times have you moved where you were living?: 0    At any time in the past 12 months, were you homeless or living in a shelter (including now)?: No  Utilities: Not At Risk (01/27/2024)   Received from [facility]    In the past 12 months has the electric, gas, oil, or water company threatened to shut off services in your home?: No  Health Literacy: Low Risk (12/28/2023)   Received from [facility] Literacy    How often do you need to have someone help you when you read instructions, pamphlets, or other written material from your doctor or pharmacy?: Never    Past Surgical History:  Procedure Laterality Date   BIOPSY  01/14/2021   Procedure: BIOPSY;  Surgeon: Cindie Carlin POUR, DO;  Location: [facility];  Service: Endoscopy;;   carotid stent Right 08/2020   COLONOSCOPY WITH PROPOFOL  N/A 01/09/2022   Surgeon:  Cindie Carlin POUR, DO; multiple tubular adenomas removed. Recommended recall 3 years.   ESOPHAGOGASTRODUODENOSCOPY (EGD) WITH PROPOFOL  N/A 01/14/2021   Surgeon: Cindie Carlin POUR, DO;  LA grade a esophagitis, gastritis, duodenitis.  Biopsies negative for H. pylori.   FEMORAL ARTERY STENT Bilateral    ILIAC ARTERY STENT Right 09/10/2021   IR ANGIO INTRA EXTRACRAN SEL INTERNAL CAROTID BILAT MOD SED  08/30/2020   IR ANGIO VERTEBRAL SEL VERTEBRAL BILAT MOD SED  08/30/2020   IR INTRAVSC STENT CERV CAROTID W/EMB-PROT MOD SED INCL ANGIO  08/30/2020   IR US  GUIDE VASC ACCESS RIGHT  08/30/2020   POLYPECTOMY  01/09/2022   Procedure: POLYPECTOMY;  Surgeon: Cindie Carlin POUR, DO;  Location: [facility];  Service: Endoscopy;;   RADIOLOGY WITH ANESTHESIA N/A 08/30/2020   Procedure: IR WITH ANESTHESIA;  Surgeon: de Macedo Rodrigues, Katyucia, MD;  Location: [facility] OR;  Service: Radiology;  Laterality: N/A;      Current Outpatient Medications:    aspirin  81 MG chewable tablet, Chew 1 tablet (81 mg total) by mouth daily., Disp: 30 tablet, Rfl: 0   cloNIDine (CATAPRES) 0.2 MG tablet, Take 0.2 mg by mouth 3 (three) times daily. Unless SBP is elevated over 160 takes 0.3, Disp: , Rfl:    cloNIDine (CATAPRES) 0.3 MG tablet, Take 0.3 mg by mouth as needed (elevated SBP over 160)., Disp: , Rfl:    clopidogrel (PLAVIX) 75 MG tablet, Take 75 mg by mouth daily., Disp: , Rfl:    Cyanocobalamin (B-12 PO), Take 1 tablet by mouth daily. Takes 3 days a week, Disp: , Rfl:    cyanocobalamin (VITAMIN B12) 500 MCG tablet, Take 2,500 mcg by mouth every other day., Disp: , Rfl:    DULoxetine  (CYMBALTA ) 20 MG capsule, Take 1 capsule (20 mg total) by mouth daily., Disp:  90 capsule, Rfl: 3   escitalopram (LEXAPRO) 10 MG tablet, Take 10 mg by mouth daily., Disp: , Rfl:    famotidine  (PEPCID ) 40 MG tablet, Take 40 mg by mouth 2 (two) times daily., Disp: , Rfl:    labetalol (NORMODYNE) 100 MG tablet, Take 100 mg by mouth 2 (two) times  daily., Disp: , Rfl:    LORazepam  (ATIVAN ) 0.5 MG tablet, Take 0.25 mg by mouth in the morning., Disp: , Rfl:    nicotine  (NICODERM CQ  - DOSED IN MG/24 HOURS) 21 mg/24hr patch, Place 1 patch onto the skin every other day., Disp: , Rfl:    olmesartan (BENICAR) 40 MG tablet, Take 40 mg by mouth daily., Disp: , Rfl:    rosuvastatin (CRESTOR) 10 MG tablet, Take 10 mg by mouth every evening., Disp: , Rfl:    topiramate (TOPAMAX) 25 MG tablet, Take 25 mg by mouth daily., Disp: , Rfl:    Vitamin D-Vitamin K (VITAMIN K2-VITAMIN D3 PO), Take 1 tablet by mouth daily., Disp: , Rfl:     Physical Exam: Blood pressure (!) 152/86, pulse 65, height 5' 6 (1.676 m), weight 172 lb 6.4 oz (78.2 kg), SpO2 96%.    Bronchitic male Right Bruit with prior ICA stent Emphysema no active wheezing AV sclerosis murmur  Abdomen benign Peripheral pulses decreased left pedal pulses  No edema  Labs:   Lab Results  Component Value Date   WBC 10.3 07/06/2021   HGB 15.5 07/06/2021   HCT 46.9 07/06/2021   MCV 91.4 07/06/2021   PLT 283 07/06/2021   No results for input(s): NA, K, CL, CO2, BUN, CREATININE, CALCIUM , PROT, BILITOT, ALKPHOS, ALT, AST, GLUCOSE in the last 168 hours.  Invalid input(s): LABALBU No results found for: CKTOTAL, CKMB, CKMBINDEX, TROPONINI  Lab Results  Component Value Date   CHOL 177 08/29/2020   CHOL 239 (H) 06/13/2020   CHOL 251 (H) 03/02/2020   Lab Results  Component Value Date   HDL 26 (L) 08/29/2020   HDL 43 06/13/2020   HDL 36 (L) 03/02/2020   Lab Results  Component Value Date   LDLCALC 125 (H) 08/29/2020   LDLCALC 140 (H) 06/13/2020   LDLCALC 158 (H) 03/02/2020   Lab Results  Component Value Date   TRIG 131 08/29/2020   TRIG 280 (H) 06/13/2020   TRIG 284 (H) 03/02/2020   Lab Results  Component Value Date   CHOLHDL 6.8 08/29/2020   CHOLHDL 5.6 06/13/2020   CHOLHDL 7.0 03/02/2020   No results found for: LDLDIRECT     Radiology: VAS US  RENAL ARTERY DUPLEX Result Date: 01/04/2024 ABDOMINAL VISCERAL Patient Name:  Caleb Powell  Date of Exam:   01/04/2024 Medical Rec #: [id number]     Accession #:    [id number] Date of Birth: Jan 03, 1971      Patient Gender: M Patient Age:   65 years Exam Location:  [address] Procedure:      VAS US  RENAL ARTERY DUPLEX Referring Phys: [id number] MAUDE BROCKS Aundrea Horace -------------------------------------------------------------------------------- Indications: Renal artery stenosis High Risk Factors: Hypertension, hyperlipidemia, current smoker. Limitations: Air/bowel gas. Comparison Study: None. Performing Technologist: Garnette Rockers  Examination Guidelines: A complete evaluation includes B-mode imaging, spectral Doppler, color Doppler, and power Doppler as needed of all accessible portions of each vessel. Bilateral testing is considered an integral part of a complete examination. Limited examinations for reoccurring indications may be performed as noted.  Duplex Findings: +------------+--------+--------+------+--------+ Mesenteric  PSV cm/sEDV cm/sPlaqueComments +------------+--------+--------+------+--------+ Aorta Mid  58                          +------------+--------+--------+------+--------+ SMA Proximal  226                          +------------+--------+--------+------+--------+    +------------------+--------+--------+-------+ Right Renal ArteryPSV cm/sEDV cm/sComment +------------------+--------+--------+-------+ Origin              184      53           +------------------+--------+--------+-------+ Proximal            258      58           +------------------+--------+--------+-------+ Mid                 349      74           +------------------+--------+--------+-------+ Distal              104      24           +------------------+--------+--------+-------+ +-----------------+--------+--------+-------+ Left Renal ArteryPSV cm/sEDV  cm/sComment +-----------------+--------+--------+-------+ Origin             109      18           +-----------------+--------+--------+-------+ Proximal           114      28           +-----------------+--------+--------+-------+ Mid                139      27           +-----------------+--------+--------+-------+ Distal             103      22           +-----------------+--------+--------+-------+ +------------+--------+--------+----+-----------+--------+--------+----+ Right KidneyPSV cm/sEDV cm/sRI  Left KidneyPSV cm/sEDV cm/sRI   +------------+--------+--------+----+-----------+--------+--------+----+ Upper Pole  42      13      0.69Upper Pole 45      12      0.73 +------------+--------+--------+----+-----------+--------+--------+----+ Mid         54      14      0.        48      12      0.75 +------------+--------+--------+----+-----------+--------+--------+----+ Lower Pole  50      14      0.72Lower Pole 46      11      0.76 +------------+--------+--------+----+-----------+--------+--------+----+ Hilar       72      16      0.78Hilar      99      12      0.88 +------------+--------+--------+----+-----------+--------+--------+----+ +------------------+-----+------------------+-----+ Right Kidney           Left Kidney             +------------------+-----+------------------+-----+ RAR                    RAR                     +------------------+-----+------------------+-----+ RAR (manual)           RAR (manual)            +------------------+-----+------------------+-----+ Cortex                 Cortex                  +------------------+-----+------------------+-----+  Cortex thickness       Corex thickness         +------------------+-----+------------------+-----+ Kidney length (cm)12.17Kidney length (cm)11.26 +------------------+-----+------------------+-----+  Summary: Renal:  Right: Normal size right  kidney. Normal right Resisitive Index.        1-59% stenosis of the right renal artery. RRV flow present. Left:  No evidence of left renal artery stenosis. LRV flow present.        Normal size of left kidney. Normal left Resistive Index.  *See table(s) above for measurements and observations.  Diagnosing physician: Timothy Gollan MD  Electronically signed by Timothy Gollan MD on 01/04/2024 at 10:24:37 PM.    Final      EKG: SR rate 55 normal   ASSESSMENT AND PLAN:   HTN:  swelling with Norvasc . Hydralazine  made him feel anxious. On d/c from [facility] was on labetalol 100 gm bid, Olmesartan 40 mg daily and PRN clonidine  No RAS on duplex 01/04/24  Stable CAD Risk:  smoking and PVD indicates cath at [facility] 2 years ago with no obstructive dx He will not do chemical stress test Lexiscan made him feel horrible PVD:  prior right ICA stent widely patent by duplex 03/18/23 continue DAT Stent right femoral artery with occluded left SFA no rest claudication F/U vascular [facility]  Neuropathy:  on gabapentin   HLD:  Has been on statin in past Now crestor 3x/week GI:  reflux contiknue dexilant   Smoking:  CXR 03/2023 NAD lung cancer CT  Fibromyalgia: ? Neuropathy no improvement off crestor will resume Called in Cymbalta  for patient can f/u with primary Previously seen by [facility] Consider C spine films   Lung cancer CT    F/U in a year   Signed: Maude Emmer 02/02/2024, 10:24 AM

## 2024-02-02 ENCOUNTER — Ambulatory Visit: Attending: Cardiovascular Disease | Admitting: Cardiovascular Disease

## 2024-02-02 VITALS — BP 152/86 | HR 65 | Ht 66.0 in | Wt 172.4 lb

## 2024-02-02 DIAGNOSIS — R079 Chest pain, unspecified: Secondary | ICD-10-CM | POA: Diagnosis not present

## 2024-02-02 DIAGNOSIS — E782 Mixed hyperlipidemia: Secondary | ICD-10-CM | POA: Insufficient documentation

## 2024-02-02 DIAGNOSIS — I169 Hypertensive crisis, unspecified: Secondary | ICD-10-CM | POA: Insufficient documentation

## 2024-02-02 DIAGNOSIS — I6521 Occlusion and stenosis of right carotid artery: Secondary | ICD-10-CM | POA: Diagnosis not present

## 2024-02-02 DIAGNOSIS — Z720 Tobacco use: Secondary | ICD-10-CM | POA: Diagnosis not present

## 2024-02-02 NOTE — Patient Instructions (Signed)
 Medication Instructions:  NO CHANGES  *If you need a refill on your cardiac medications before your next appointment, please call your pharmacy*  Testing/Procedures: Low Dose CT for lung cancer screening  Follow-Up: At [facility], you and your health needs are our priority.  As part of our continuing mission to provide you with exceptional heart care, our providers are all part of one team.  This team includes your primary Cardiologist (physician) and Advanced Practice Providers or APPs (Physician Assistants and Nurse Practitioners) who all work together to provide you with the care you need, when you need it.  Your next appointment:    12 months with Dr. Delford   We recommend signing up for the patient portal called MyChart.  Sign up information is provided on this After Visit Summary.  MyChart is used to connect with patients for Virtual Visits (Telemedicine).  Patients are able to view lab/test results, encounter notes, upcoming appointments, etc.  Non-urgent messages can be sent to your provider as well.   To learn more about what you can do with MyChart, go to [email or web address].   Other Instructions

## 2024-02-10 ENCOUNTER — Ambulatory Visit: Admitting: Cardiovascular Disease

## 2024-02-22 ENCOUNTER — Ambulatory Visit (HOSPITAL_COMMUNITY)

## 2024-02-25 ENCOUNTER — Institutional Professional Consult (permissible substitution) (HOSPITAL_BASED_OUTPATIENT_CLINIC_OR_DEPARTMENT_OTHER): Admitting: Family
